# Patient Record
Sex: Female | Born: 1935 | Race: Black or African American | Hispanic: No | Smoking: Former smoker
Health system: Southern US, Community
[De-identification: ages and names within clinical notes are randomized; demographics above are authoritative.]

## PROBLEM LIST (undated history)

## (undated) DIAGNOSIS — I2510 Atherosclerotic heart disease of native coronary artery without angina pectoris: Secondary | ICD-10-CM

## (undated) DIAGNOSIS — E785 Hyperlipidemia, unspecified: Secondary | ICD-10-CM

## (undated) DIAGNOSIS — I10 Essential (primary) hypertension: Secondary | ICD-10-CM

## (undated) DIAGNOSIS — F0390 Unspecified dementia without behavioral disturbance: Secondary | ICD-10-CM

## (undated) DIAGNOSIS — M24562 Contracture, left knee: Secondary | ICD-10-CM

## (undated) DIAGNOSIS — M24561 Contracture, right knee: Secondary | ICD-10-CM

## (undated) HISTORY — DX: Essential (primary) hypertension: I10

## (undated) HISTORY — DX: Hyperlipidemia, unspecified: E78.5

## (undated) HISTORY — DX: Unspecified dementia, unspecified severity, without behavioral disturbance, psychotic disturbance, mood disturbance, and anxiety: F03.90

## (undated) HISTORY — PX: ABDOMINAL HYSTERECTOMY: SHX81

## (undated) HISTORY — PX: HIP SURGERY: SHX245

---

## 2009-03-01 ENCOUNTER — Emergency Department: Payer: Self-pay | Admitting: Emergency Medicine

## 2009-03-27 ENCOUNTER — Ambulatory Visit: Payer: Self-pay | Admitting: Internal Medicine

## 2009-06-24 ENCOUNTER — Emergency Department: Payer: Self-pay

## 2009-11-01 ENCOUNTER — Ambulatory Visit: Payer: Self-pay | Admitting: Internal Medicine

## 2010-02-07 ENCOUNTER — Encounter: Payer: Self-pay | Admitting: Internal Medicine

## 2010-02-12 ENCOUNTER — Encounter: Payer: Self-pay | Admitting: Internal Medicine

## 2010-03-15 ENCOUNTER — Encounter: Payer: Self-pay | Admitting: Internal Medicine

## 2013-09-05 ENCOUNTER — Emergency Department: Payer: Self-pay | Admitting: Internal Medicine

## 2013-09-05 LAB — URIC ACID: Uric Acid: 4.4 mg/dL (ref 2.6–6.0)

## 2013-10-13 ENCOUNTER — Encounter: Payer: Self-pay | Admitting: General Surgery

## 2013-10-13 ENCOUNTER — Ambulatory Visit (INDEPENDENT_AMBULATORY_CARE_PROVIDER_SITE_OTHER): Payer: Medicare PPO | Admitting: General Surgery

## 2013-10-13 VITALS — BP 100/78 | HR 76 | Resp 14 | Ht 61.0 in | Wt 89.0 lb

## 2013-10-13 DIAGNOSIS — D1779 Benign lipomatous neoplasm of other sites: Secondary | ICD-10-CM

## 2013-10-13 DIAGNOSIS — D1720 Benign lipomatous neoplasm of skin and subcutaneous tissue of unspecified limb: Secondary | ICD-10-CM

## 2013-10-13 NOTE — Progress Notes (Signed)
Patient ID: Tonya Snyder, female   DOB: 1920/10/24, 80 y.o.   MRN: [id number]  Chief Complaint  Patient presents with  . Mass    right arm    HPI Tonya Snyder is a 80 y.o. female who presents for an evaluation of a right arm mass. The patient noticed the mass approximately 10-15 years. The patient states the area has gotten larger. She denies any pain or discomfort in this area. Today her granddaughter, Tonya Snyder, accompanies her .     HPI  Past Medical History  Diagnosis Date  . Hypertension   . Dementia   . Hyperlipidemia     Past Surgical History  Procedure Laterality Date  . Abdominal hysterectomy    . Hip surgery      No family history on file.  Social History History  Substance Use Topics  . Smoking status: Never Smoker   . Smokeless tobacco: Current User    Types: Snuff  . Alcohol Use: No    No Known Allergies  Current Outpatient Prescriptions  Medication Sig Dispense Refill  . amLODipine (NORVASC) 5 MG tablet Take 1 tablet by mouth daily.      Marland Kitchen aspirin 81 MG tablet Take 1 tablet by mouth daily.      . benazepril (LOTENSIN) 10 MG tablet Take 1 tablet by mouth daily.      . clotrimazole-betamethasone (LOTRISONE) cream Apply 1 application topically 2 (two) times daily.      Marland Kitchen DOCUSATE SODIUM PO Take 1 tablet by mouth daily.      . hydrochlorothiazide (HYDRODIURIL) 25 MG tablet Take 1 tablet by mouth daily.      . hydrocortisone cream 1 % Apply 1 application topically 4 (four) times daily.      . mirtazapine (REMERON) 15 MG tablet Take 1 tablet by mouth daily.      . Multiple Vitamins-Minerals (MULTIVITAMIN PO) Take 1 tablet by mouth daily.      . potassium chloride (K-DUR,KLOR-CON) 10 MEQ tablet Take 1 tablet by mouth daily.      . simvastatin (ZOCOR) 20 MG tablet Take 1 tablet by mouth daily.       No current facility-administered medications for this visit.    Review of Systems Review of Systems  Constitutional: Negative.   Respiratory: Negative.    Cardiovascular: Negative.     Blood pressure 100/78, pulse 76, resp. rate 14, height 5\' 1"  (1.549 m), weight 89 lb (40.37 kg).  Physical Exam Physical Exam  Constitutional: She is oriented to person, place, and time. She appears well-developed and well-nourished.  Musculoskeletal:       Arms: Neurological: She is alert and oriented to person, place, and time.  Skin: Skin is warm and dry.  right anterior distal forearm 3 x 4 cm soft lobulated subdermal mass.       Assessment    Lipoma of the right arm, asymptomatic.     Plan    Os becomes uncomfortable, changes significantly in size or begins to cause undue concern for the patient, observation alone is warranted.     PCP/Ref MD: Tonya Snyder 10/14/2013, 8:38 PM

## 2013-10-13 NOTE — Patient Instructions (Signed)
Patient to return as needed. The patient is aware to call back for any questions or concerns. 

## 2013-10-14 DIAGNOSIS — D1720 Benign lipomatous neoplasm of skin and subcutaneous tissue of unspecified limb: Secondary | ICD-10-CM | POA: Insufficient documentation

## 2013-12-14 ENCOUNTER — Encounter: Payer: Self-pay | Admitting: General Surgery

## 2014-05-10 ENCOUNTER — Emergency Department: Payer: Self-pay | Admitting: Emergency Medicine

## 2014-05-10 DIAGNOSIS — S7002XA Contusion of left hip, initial encounter: Secondary | ICD-10-CM | POA: Diagnosis not present

## 2014-05-10 DIAGNOSIS — R55 Syncope and collapse: Secondary | ICD-10-CM | POA: Diagnosis not present

## 2014-05-10 LAB — CBC WITH DIFFERENTIAL/PLATELET
BASOS PCT: 0.3 %
Basophil #: 0 10*3/uL (ref 0.0–0.1)
EOS PCT: 0.6 %
Eosinophil #: 0 10*3/uL (ref 0.0–0.7)
HCT: 37.1 % (ref 35.0–47.0)
HGB: 12.6 g/dL (ref 12.0–16.0)
LYMPHS PCT: 11 %
Lymphocyte #: 0.4 10*3/uL — ABNORMAL LOW (ref 1.0–3.6)
MCH: 30.7 pg (ref 26.0–34.0)
MCHC: 34 g/dL (ref 32.0–36.0)
MCV: 91 fL (ref 80–100)
MONOS PCT: 9.2 %
Monocyte #: 0.4 x10 3/mm (ref 0.2–0.9)
NEUTROS PCT: 78.9 %
Neutrophil #: 3 10*3/uL (ref 1.4–6.5)
Platelet: 192 10*3/uL (ref 150–440)
RBC: 4.1 10*6/uL (ref 3.80–5.20)
RDW: 14.7 % — ABNORMAL HIGH (ref 11.5–14.5)
WBC: 3.8 10*3/uL (ref 3.6–11.0)

## 2014-05-10 LAB — COMPREHENSIVE METABOLIC PANEL
ALBUMIN: 3.5 g/dL
ALT: 14 U/L
ANION GAP: 9 (ref 7–16)
AST: 21 U/L
Alkaline Phosphatase: 61 U/L
BUN: 18 mg/dL
Bilirubin,Total: 0.4 mg/dL
CHLORIDE: 101 mmol/L
Calcium, Total: 8.9 mg/dL
Co2: 29 mmol/L
Creatinine: 0.7 mg/dL
EGFR (Non-African Amer.): >60
Glucose: 78 mg/dL
Potassium: 3.3 mmol/L — ABNORMAL LOW
SODIUM: 139 mmol/L
TOTAL PROTEIN: 6.8 g/dL

## 2014-06-10 DIAGNOSIS — I739 Peripheral vascular disease, unspecified: Secondary | ICD-10-CM | POA: Diagnosis not present

## 2014-06-17 DIAGNOSIS — G309 Alzheimer's disease, unspecified: Secondary | ICD-10-CM | POA: Diagnosis not present

## 2014-06-17 DIAGNOSIS — D649 Anemia, unspecified: Secondary | ICD-10-CM | POA: Diagnosis not present

## 2014-06-17 DIAGNOSIS — I10 Essential (primary) hypertension: Secondary | ICD-10-CM | POA: Diagnosis not present

## 2014-06-17 DIAGNOSIS — Z9181 History of falling: Secondary | ICD-10-CM | POA: Diagnosis not present

## 2014-06-17 DIAGNOSIS — F0280 Dementia in other diseases classified elsewhere without behavioral disturbance: Secondary | ICD-10-CM | POA: Diagnosis not present

## 2014-06-24 DIAGNOSIS — D649 Anemia, unspecified: Secondary | ICD-10-CM | POA: Diagnosis not present

## 2014-06-24 DIAGNOSIS — I10 Essential (primary) hypertension: Secondary | ICD-10-CM | POA: Diagnosis not present

## 2014-06-24 DIAGNOSIS — G309 Alzheimer's disease, unspecified: Secondary | ICD-10-CM | POA: Diagnosis not present

## 2014-06-24 DIAGNOSIS — Z9181 History of falling: Secondary | ICD-10-CM | POA: Diagnosis not present

## 2014-06-24 DIAGNOSIS — F0280 Dementia in other diseases classified elsewhere without behavioral disturbance: Secondary | ICD-10-CM | POA: Diagnosis not present

## 2014-07-07 ENCOUNTER — Encounter: Payer: Self-pay | Admitting: Emergency Medicine

## 2014-07-07 ENCOUNTER — Emergency Department: Payer: Medicare PPO

## 2014-07-07 ENCOUNTER — Other Ambulatory Visit: Payer: Self-pay

## 2014-07-07 ENCOUNTER — Observation Stay
Admission: EM | Admit: 2014-07-07 | Discharge: 2014-07-14 | Disposition: A | Discharge status: Skilled Nursing Facility | Payer: Medicare PPO | Attending: Internal Medicine | Admitting: Internal Medicine

## 2014-07-07 DIAGNOSIS — M25551 Pain in right hip: Secondary | ICD-10-CM | POA: Diagnosis not present

## 2014-07-07 DIAGNOSIS — R630 Anorexia: Secondary | ICD-10-CM | POA: Insufficient documentation

## 2014-07-07 DIAGNOSIS — R531 Weakness: Secondary | ICD-10-CM | POA: Diagnosis not present

## 2014-07-07 DIAGNOSIS — R195 Other fecal abnormalities: Secondary | ICD-10-CM | POA: Insufficient documentation

## 2014-07-07 DIAGNOSIS — Z66 Do not resuscitate: Secondary | ICD-10-CM | POA: Diagnosis not present

## 2014-07-07 DIAGNOSIS — Z87891 Personal history of nicotine dependence: Secondary | ICD-10-CM | POA: Insufficient documentation

## 2014-07-07 DIAGNOSIS — Z79899 Other long term (current) drug therapy: Secondary | ICD-10-CM | POA: Insufficient documentation

## 2014-07-07 DIAGNOSIS — K5900 Constipation, unspecified: Secondary | ICD-10-CM | POA: Insufficient documentation

## 2014-07-07 DIAGNOSIS — E43 Unspecified severe protein-calorie malnutrition: Secondary | ICD-10-CM | POA: Insufficient documentation

## 2014-07-07 DIAGNOSIS — E785 Hyperlipidemia, unspecified: Secondary | ICD-10-CM | POA: Diagnosis not present

## 2014-07-07 DIAGNOSIS — I10 Essential (primary) hypertension: Secondary | ICD-10-CM | POA: Diagnosis not present

## 2014-07-07 DIAGNOSIS — Z96641 Presence of right artificial hip joint: Secondary | ICD-10-CM | POA: Diagnosis not present

## 2014-07-07 DIAGNOSIS — Z7982 Long term (current) use of aspirin: Secondary | ICD-10-CM | POA: Insufficient documentation

## 2014-07-07 DIAGNOSIS — J189 Pneumonia, unspecified organism: Principal | ICD-10-CM | POA: Diagnosis present

## 2014-07-07 DIAGNOSIS — Z681 Body mass index (BMI) 19 or less, adult: Secondary | ICD-10-CM | POA: Diagnosis not present

## 2014-07-07 DIAGNOSIS — E876 Hypokalemia: Secondary | ICD-10-CM | POA: Diagnosis present

## 2014-07-07 DIAGNOSIS — R778 Other specified abnormalities of plasma proteins: Secondary | ICD-10-CM

## 2014-07-07 DIAGNOSIS — F0390 Unspecified dementia without behavioral disturbance: Secondary | ICD-10-CM | POA: Insufficient documentation

## 2014-07-07 DIAGNOSIS — R296 Repeated falls: Secondary | ICD-10-CM | POA: Diagnosis not present

## 2014-07-07 DIAGNOSIS — R7989 Other specified abnormal findings of blood chemistry: Secondary | ICD-10-CM

## 2014-07-07 DIAGNOSIS — W19XXXA Unspecified fall, initial encounter: Secondary | ICD-10-CM | POA: Diagnosis present

## 2014-07-07 LAB — CBC WITH DIFFERENTIAL/PLATELET
BASOS PCT: 0 %
Basophils Absolute: 0 10*3/uL (ref 0–0.1)
Eosinophils Absolute: 0 10*3/uL (ref 0–0.7)
Eosinophils Relative: 0 %
HEMATOCRIT: 36.8 % (ref 35.0–47.0)
Hemoglobin: 12.6 g/dL (ref 12.0–16.0)
LYMPHS PCT: 4 %
Lymphs Abs: 0.3 10*3/uL — ABNORMAL LOW (ref 1.0–3.6)
MCH: 30.4 pg (ref 26.0–34.0)
MCHC: 34.4 g/dL (ref 32.0–36.0)
MCV: 88.4 fL (ref 80.0–100.0)
MONOS PCT: 7 %
Monocytes Absolute: 0.5 10*3/uL (ref 0.2–0.9)
NEUTROS ABS: 6.6 10*3/uL — AB (ref 1.4–6.5)
NEUTROS PCT: 89 %
Platelets: 169 10*3/uL (ref 150–440)
RBC: 4.16 MIL/uL (ref 3.80–5.20)
RDW: 13.8 % (ref 11.5–14.5)
WBC: 7.4 10*3/uL (ref 3.6–11.0)

## 2014-07-07 LAB — COMPREHENSIVE METABOLIC PANEL
ALBUMIN: 3.6 g/dL (ref 3.5–5.0)
ALK PHOS: 77 U/L (ref 38–126)
ALT: 19 U/L (ref 14–54)
AST: 35 U/L (ref 15–41)
Anion gap: 9 (ref 5–15)
BUN: 8 mg/dL (ref 6–20)
CALCIUM: 8.8 mg/dL — AB (ref 8.9–10.3)
CO2: 25 mmol/L (ref 22–32)
Chloride: 100 mmol/L — ABNORMAL LOW (ref 101–111)
Creatinine, Ser: 0.54 mg/dL (ref 0.44–1.00)
GFR calc Af Amer: >60 mL/min (ref >60)
GFR calc non Af Amer: >60 mL/min (ref >60)
GLUCOSE: 103 mg/dL — AB (ref 65–99)
POTASSIUM: 3.1 mmol/L — AB (ref 3.5–5.1)
Sodium: 134 mmol/L — ABNORMAL LOW (ref 135–145)
TOTAL PROTEIN: 6.9 g/dL (ref 6.5–8.1)
Total Bilirubin: 0.6 mg/dL (ref 0.3–1.2)

## 2014-07-07 LAB — TROPONIN I
TROPONIN I: 0.04 ng/mL — AB (ref <0.031)
Troponin I: 0.05 ng/mL — ABNORMAL HIGH (ref <0.031)

## 2014-07-07 LAB — URINALYSIS COMPLETE WITH MICROSCOPIC (ARMC ONLY)
Bacteria, UA: NONE SEEN
Bilirubin Urine: NEGATIVE
Glucose, UA: NEGATIVE mg/dL
Leukocytes, UA: NEGATIVE
Nitrite: NEGATIVE
Protein, ur: NEGATIVE mg/dL
SQUAMOUS EPITHELIAL / LPF: NONE SEEN
Specific Gravity, Urine: 1.004 — ABNORMAL LOW (ref 1.005–1.030)
pH: 7 (ref 5.0–8.0)

## 2014-07-07 LAB — PROTIME-INR
INR: 1.08
PROTHROMBIN TIME: 14.2 s (ref 11.4–15.0)

## 2014-07-07 LAB — MAGNESIUM: Magnesium: 1.6 mg/dL — ABNORMAL LOW (ref 1.7–2.4)

## 2014-07-07 MED ORDER — AMLODIPINE BESYLATE 5 MG PO TABS
5.0000 mg | ORAL_TABLET | Freq: Every day | ORAL | Status: DC
  Administered 2014-07-07 – 2014-07-14 (×8): 5 mg via ORAL
  Filled 2014-07-07 (×8): qty 1

## 2014-07-07 MED ORDER — LEVOFLOXACIN 750 MG PO TABS
ORAL_TABLET | ORAL | Status: AC
  Filled 2014-07-07: qty 1

## 2014-07-07 MED ORDER — POTASSIUM CHLORIDE CRYS ER 10 MEQ PO TBCR
10.0000 meq | EXTENDED_RELEASE_TABLET | Freq: Every day | ORAL | Status: DC
  Administered 2014-07-07 – 2014-07-14 (×8): 10 meq via ORAL
  Filled 2014-07-07 (×8): qty 1

## 2014-07-07 MED ORDER — DOCUSATE SODIUM 100 MG PO CAPS
100.0000 mg | ORAL_CAPSULE | Freq: Every day | ORAL | Status: DC
  Administered 2014-07-07 – 2014-07-13 (×7): 100 mg via ORAL
  Filled 2014-07-07 (×7): qty 1

## 2014-07-07 MED ORDER — CEFTRIAXONE SODIUM IN DEXTROSE 20 MG/ML IV SOLN
1.0000 g | INTRAVENOUS | Status: DC
  Administered 2014-07-07 – 2014-07-09 (×3): 1 g via INTRAVENOUS
  Filled 2014-07-07 (×3): qty 50

## 2014-07-07 MED ORDER — AZITHROMYCIN 250 MG PO TABS
ORAL_TABLET | ORAL | Status: AC
  Administered 2014-07-07: 500 mg via ORAL
  Filled 2014-07-07: qty 2

## 2014-07-07 MED ORDER — SIMVASTATIN 20 MG PO TABS
20.0000 mg | ORAL_TABLET | Freq: Every day | ORAL | Status: DC
  Administered 2014-07-07 – 2014-07-13 (×7): 20 mg via ORAL
  Filled 2014-07-07 (×7): qty 1

## 2014-07-07 MED ORDER — AZITHROMYCIN 250 MG PO TABS
500.0000 mg | ORAL_TABLET | ORAL | Status: DC
  Administered 2014-07-07 – 2014-07-09 (×3): 500 mg via ORAL
  Filled 2014-07-07 (×3): qty 2

## 2014-07-07 MED ORDER — ENOXAPARIN SODIUM 100 MG/ML ~~LOC~~ SOLN
SUBCUTANEOUS | Status: AC
  Filled 2014-07-07: qty 1

## 2014-07-07 MED ORDER — LEVOFLOXACIN 750 MG PO TABS
750.0000 mg | ORAL_TABLET | Freq: Once | ORAL | Status: AC
  Administered 2014-07-07: 750 mg via ORAL

## 2014-07-07 MED ORDER — ASPIRIN EC 81 MG PO TBEC
81.0000 mg | DELAYED_RELEASE_TABLET | Freq: Every day | ORAL | Status: DC
  Administered 2014-07-07 – 2014-07-14 (×8): 81 mg via ORAL
  Filled 2014-07-07 (×8): qty 1

## 2014-07-07 MED ORDER — ENOXAPARIN SODIUM 40 MG/0.4ML ~~LOC~~ SOLN
40.0000 mg | SUBCUTANEOUS | Status: DC
  Administered 2014-07-07: 21:00:00 40 mg via SUBCUTANEOUS
  Filled 2014-07-07: qty 0.4

## 2014-07-07 MED ORDER — HYDRALAZINE HCL 20 MG/ML IJ SOLN
10.0000 mg | Freq: Four times a day (QID) | INTRAMUSCULAR | Status: DC | PRN
  Administered 2014-07-09: 03:00:00 10 mg via INTRAVENOUS
  Filled 2014-07-07: qty 1

## 2014-07-07 MED ORDER — POTASSIUM CHLORIDE CRYS ER 20 MEQ PO TBCR
EXTENDED_RELEASE_TABLET | ORAL | Status: AC
  Administered 2014-07-07: 40 meq via ORAL
  Filled 2014-07-07: qty 2

## 2014-07-07 MED ORDER — LEVOFLOXACIN 750 MG PO TABS: 750.0000 mg | ORAL_TABLET | Freq: Every day | ORAL | Status: DC

## 2014-07-07 MED ORDER — BENAZEPRIL HCL 10 MG PO TABS
10.0000 mg | ORAL_TABLET | Freq: Every day | ORAL | Status: DC
  Administered 2014-07-07 – 2014-07-14 (×8): 10 mg via ORAL
  Filled 2014-07-07 (×8): qty 1

## 2014-07-07 MED ORDER — MIRTAZAPINE 7.5 MG PO TABS
7.5000 mg | ORAL_TABLET | Freq: Every day | ORAL | Status: DC
  Administered 2014-07-07: 21:00:00 7.5 mg via ORAL
  Administered 2014-07-08: 21:00:00 via ORAL
  Administered 2014-07-09 – 2014-07-13 (×5): 7.5 mg via ORAL
  Filled 2014-07-07 (×10): qty 1

## 2014-07-07 MED ORDER — POTASSIUM CHLORIDE CRYS ER 20 MEQ PO TBCR
40.0000 meq | EXTENDED_RELEASE_TABLET | Freq: Once | ORAL | Status: AC
  Administered 2014-07-07: 40 meq via ORAL

## 2014-07-07 MED ORDER — CEFTRIAXONE SODIUM IN DEXTROSE 20 MG/ML IV SOLN
INTRAVENOUS | Status: AC
  Administered 2014-07-07: 1 g via INTRAVENOUS
  Filled 2014-07-07: qty 50

## 2014-07-07 NOTE — ED Notes (Signed)
RN assisted pt ambulation with walker. Pt weak and unbalanced at times. MD aware. Family at bedside. Will continue to monitor.

## 2014-07-07 NOTE — ED Provider Notes (Addendum)
[facility] Emergency Department Provider Note     Time seen: ----------------------------------------- 7:15 AM on 07/07/2014 -----------------------------------------    I have reviewed the triage vital signs and the nursing notes. Level V caveat, review of systems and history unable to be obtained due to history of dementia  HISTORY  Chief Complaint No chief complaint on file.    HPI Tonya Snyder is a 81 y.o. female who is brought to ER for a fall.Patient reportedly tripped and fell today, has no complaints does not describe any pain at all. Family also reports that they found some blood in her diaper, they're unsure where it came from. She's not had a history of bleeding, has had multiple recent falls, she refuses to use her walker, will use a cane at times but not the walker.    Past Medical History  Diagnosis Date  . Hypertension   . Dementia   . Hyperlipidemia     Patient Active Problem List   Diagnosis Date Noted  . Lipoma of arm 10/14/2013    Past Surgical History  Procedure Laterality Date  . Abdominal hysterectomy    . Hip surgery      Current Outpatient Rx  Name  Route  Sig  Dispense  Refill  . amLODipine (NORVASC) 5 MG tablet   Oral   Take 1 tablet by mouth daily.         Marland Kitchen aspirin 81 MG tablet   Oral   Take 1 tablet by mouth daily.         . benazepril (LOTENSIN) 10 MG tablet   Oral   Take 1 tablet by mouth daily.         . clotrimazole-betamethasone (LOTRISONE) cream   Topical   Apply 1 application topically 2 (two) times daily.         Marland Kitchen DOCUSATE SODIUM PO   Oral   Take 1 tablet by mouth daily.         . hydrochlorothiazide (HYDRODIURIL) 25 MG tablet   Oral   Take 1 tablet by mouth daily.         . hydrocortisone cream 1 %   Topical   Apply 1 application topically 4 (four) times daily.         . mirtazapine (REMERON) 15 MG tablet   Oral   Take 1 tablet by mouth daily.         .  Multiple Vitamins-Minerals (MULTIVITAMIN PO)   Oral   Take 1 tablet by mouth daily.         . potassium chloride (K-DUR,KLOR-CON) 10 MEQ tablet   Oral   Take 1 tablet by mouth daily.         . simvastatin (ZOCOR) 20 MG tablet   Oral   Take 1 tablet by mouth daily.           Allergies Review of patient's allergies indicates no known allergies.  No family history on file.  Social History History  Substance Use Topics  . Smoking status: Never Smoker   . Smokeless tobacco: Current User    Types: Snuff  . Alcohol Use: No    Review of Systems Patient denies complaints, review of systems difficult due to dementia Positive for bleeding, unsure source 10-point ROS otherwise negative.  ____________________________________________   PHYSICAL EXAM:  VITAL SIGNS: ED Triage Vitals  Enc Vitals Group     BP --      Pulse --  Resp --      Temp --      Temp src --      SpO2 --      Weight --      Height --      Head Cir --      Peak Flow --      Pain Score --      Pain Loc --      Pain Edu? --      Excl. in [place]? --     Constitutional: Alert and oriented. Well appearing and in no distress. Eyes: Conjunctivae are normal. PERRL. Normal extraocular movements. ENT   Head: Normocephalic and atraumatic.   Nose: No congestion/rhinnorhea.   Mouth/Throat: Mucous membranes are moist.   Neck: No stridor. Hematological/Lymphatic/Immunilogical: No cervical lymphadenopathy. Cardiovascular: Normal rate, regular rhythm. Normal and symmetric distal pulses are present in all extremities. No murmurs, rubs, or gallops. Respiratory: Normal respiratory effort without tachypnea nor retractions. Breath sounds are clear and equal bilaterally. No wheezes/rales/rhonchi. Gastrointestinal: Soft and nontender. No distention. No abdominal bruits. There is no CVA tenderness. Genitourinary: No obvious vaginal bleeding no bleeding around the vagina Rectal: No obvious hemorrhoids,  she does have heme positive stool. No gross blood Musculoskeletal: Nontender with normal range of motion in all extremities. No joint effusions.  No lower extremity tenderness nor edema. Neurologic:  Normal speech and language. No gross focal neurologic deficits are appreciated. Speech is normal. No gait instability. Skin:  Skin is warm, dry and intact. No rash noted.  ____________________________________________  EKG: Interpreted by me, rate is 90, LVH, no evidence of acute infarction, nonspecific T wave changes, normal axis.  LABS (pertinent positives/negatives)  Labs Reviewed  CBC WITH DIFFERENTIAL/PLATELET - Abnormal; Notable for the following:    Neutro Abs 6.6 (*)    Lymphs Abs 0.3 (*)    All other components within normal limits  COMPREHENSIVE METABOLIC PANEL - Abnormal; Notable for the following:    Sodium 134 (*)    Potassium 3.1 (*)    Chloride 100 (*)    Glucose, Bld 103 (*)    Calcium 8.8 (*)    All other components within normal limits  TROPONIN I - Abnormal; Notable for the following:    Troponin I 0.05 (*)    All other components within normal limits  TROPONIN I - Abnormal; Notable for the following:    Troponin I 0.04 (*)    All other components within normal limits  PROTIME-INR    ____________________________________________  ED COURSE:  Pertinent labs & imaging results that were available during my care of the patient were reviewed by me and considered in my medical decision making (see chart for details).  I will attempt to ambulate the patient, at this point she does not appear to have any injuries. We'll assess for GI bleed. ____________________________________________   RADIOLOGY  None indicated  ____________________________________________    FINAL ASSESSMENT AND PLAN  Fall without complaint, dementia, heme positive stool, possible pneumonia  Plan: Patient does not appear to be any acute distress, does not have any visible or obvious  injuries. However she does have heme positive stools. There was report of blood being seen rectally, which I did it not seeing now. Her troponin was trace elevated 0.05, recheck is 0.04. EKG does not indicate ST elevation. Patient with possible left lower lobe atelectasis versus pneumonia. She does not have any signs or symptoms of this, we'll double cover with Levaquin. Family is unhappy with her going  home, but I advise she does not have any specific criteria for admission this time, was arranged home health physical therapy. She is stable for outpatient follow-up, will have her follow-up with her doctor first for reevaluation.  Family is uncomfortable taking the patient home despite multiple efforts to reassure them. [facility] consult hospitalist for observation.  Earleen Newport, MD     Earleen Newport, MD 07/07/14 1254  Earleen Newport, MD 07/07/14 [phone]

## 2014-07-07 NOTE — Plan of Care (Signed)
Problem: Discharge Progression Outcomes Goal: Other Discharge Outcomes/Goals Outcome: Progressing Goals and outcome progression: -Pt admitted with LLL pneumonia with a major decline the past couple day's per family - High fall risk, more then 2 falls in 6 months - Pt is observation, possible discharge tomorrow after physical therapy sees physical therapy and evalautes level of needed care. - code status was discussed with pt and she wants to remain a full code

## 2014-07-07 NOTE — Plan of Care (Signed)
Problem: Discharge Progression Outcomes Goal: Discharge plan in place and appropriate Outcome: Progressing Pt is observation

## 2014-07-07 NOTE — ED Notes (Signed)
Per Dr. Bridgett Larsson will put orders in for blood pressure.

## 2014-07-07 NOTE — Plan of Care (Signed)
Problem: Discharge Progression Outcomes Goal: Pain controlled with appropriate interventions Outcome: Progressing No pain noted Goal: Tolerating diet Outcome: Progressing Pt has lost more then 15 pounds in 3 months, she is 88lbs. Family says she has a poor appetite and dietary consult maybe needed for interventions.

## 2014-07-07 NOTE — Discharge Instructions (Signed)
Fall Prevention and Home Safety Falls cause injuries and can affect all age groups. It is possible to prevent falls.  HOW TO PREVENT FALLS  Wear shoes with rubber soles that do not have an opening for your toes.  Keep the inside and outside of your house well lit.  Use night lights throughout your home.  Remove clutter from floors.  Clean up floor spills.  Remove throw rugs or fasten them to the floor with carpet tape.  Do not place electrical cords across pathways.  Put grab bars by your tub, shower, and toilet. Do not use towel bars as grab bars.  Put handrails on both sides of the stairway. Fix loose handrails.  Do not climb on stools or stepladders, if possible.  Do not wax your floors.  Repair uneven or unsafe sidewalks, walkways, or stairs.  Keep items you use a lot within reach.  Be aware of pets.  Keep emergency numbers next to the telephone.  Put smoke detectors in your home and near bedrooms. Ask your doctor what other things you can do to prevent falls. Document Released: 11/25/2008 Document Revised: 07/31/2011 Document Reviewed: 05/01/2011 [facility] Patient Information 2015 [place], [place]. This information is not intended to replace advice given to you by your health care provider. Make sure you discuss any questions you have with your health care provider. Rectal Bleeding Rectal bleeding is when blood passes out of the anus. It is usually a sign that something is wrong. It may not be serious, but it should always be evaluated. Rectal bleeding may present as bright red blood or extremely dark stools. The color may range from dark red or maroon to black (like tar). It is important that the cause of rectal bleeding be identified so treatment can be started and the problem corrected. CAUSES   Hemorrhoids. These are enlarged (dilated) blood vessels or veins in the anal or rectal area.  Fistulas. Theseare abnormal, burrowing channels that usually run from inside  the rectum to the skin around the anus. They can bleed.  Anal fissures. This is a tear in the tissue of the anus. Bleeding occurs with bowel movements.  Diverticulosis. This is a condition in which pockets or sacs project from the bowel wall. Occasionally, the sacs can bleed.  Diverticulitis. Thisis an infection involving diverticulosis of the colon.  Proctitis and colitis. These are conditions in which the rectum, colon, or both, can become inflamed and pitted (ulcerated).  Polyps and cancer. Polyps are non-cancerous (benign) growths in the colon that may bleed. Certain types of polyps turn into cancer.  Protrusion of the rectum. Part of the rectum can project from the anus and bleed.  Certain medicines.  Intestinal infections.  Blood vessel abnormalities. HOME CARE INSTRUCTIONS  Eat a high-fiber diet to keep your stool soft.  Limit activity.  Drink enough fluids to keep your urine clear or pale yellow.  Warm baths may be useful to soothe rectal pain.  Follow up with your caregiver as directed. SEEK IMMEDIATE MEDICAL CARE IF:  You develop increased bleeding.  You have black or dark red stools.  You vomit blood or material that looks like coffee grounds.  You have abdominal pain or tenderness.  You have a fever.  You feel weak, nauseous, or you faint.  You have severe rectal pain or you are unable to have a bowel movement. MAKE SURE YOU:  Understand these instructions.  Will watch your condition.  Will get help right away if you are not doing  well or get worse. Document Released: 07/21/2001 Document Revised: 04/23/2011 Document Reviewed: 07/16/2010 [facility] Patient Information 2015 [place], [place]. This information is not intended to replace advice given to you by your health care provider. Make sure you discuss any questions you have with your health care provider.

## 2014-07-07 NOTE — H&P (Signed)
[facility] [place] NAME: Tonya Snyder    MR#:  [id number]  DATE OF BIRTH:  05-10-1920  DATE OF ADMISSION:  07/07/2014  PRIMARY CARE PHYSICIAN: Lavera Guise, MD   REQUESTING/REFERRING PHYSICIAN: Dr. Jimmye Norman.  CHIEF COMPLAINT:   Chief Complaint  Patient presents with  . Fall    HISTORY OF PRESENT ILLNESS:  Tonya Snyder  is a 81 y.o. female with a known history of hypertension, hyperlipidemia and dementia. The patient was brought to the ED due to fall today. According to patient's son and grandson, the patient the can walk herself on her baseline. However, the patient has had a poor oral intake, generalized weakness and multiple falls for the past a few days. She was noticed to be confused and fell today. The patient is a demented, unable to provide any information. She denies any symptoms. According to patient's son and grandson, patient has no cough shortness breath or sputum, no chest pain or leg edema, no abdominal pain, nausea, vomiting or diarrhea, no black stool bloody stool. Chest x-ray show left base pneumonia. Troponin is 0.05 then recheck is 0.04. Dr. Jimmye Norman, ED physician, planned to discharge the patient with Levaquin, but the family member's and had PE with her going home, so patient will be placed for observation. Past Medical History  Diagnosis Date  . Hypertension   . Dementia   . Hyperlipidemia     PAST SURGICAL HISTORY:   Past Surgical History  Procedure Laterality Date  . Abdominal hysterectomy    . Hip surgery      SOCIAL HISTORY:   History  Substance Use Topics  . Smoking status: Never Smoker   . Smokeless tobacco: Current User    Types: Snuff  . Alcohol Use: No    FAMILY HISTORY:  History reviewed. No pertinent family history. hypertension, diabetes, heart attack.  DRUG ALLERGIES:  No Known Allergies  REVIEW OF SYSTEMS:  The patient is a demented, she she denies any symptoms. System is not  unreliable.  MEDICATIONS AT HOME:   Prior to Admission medications   Medication Sig Start Date End Date Taking? Authorizing Provider  amLODipine (NORVASC) 5 MG tablet Take 1 tablet by mouth daily. 10/05/13  Yes Historical Provider, MD  aspirin EC 81 MG tablet Take 81 mg by mouth daily.   Yes Historical Provider, MD  benazepril (LOTENSIN) 10 MG tablet Take 1 tablet by mouth daily. 10/05/13  Yes Historical Provider, MD  docusate sodium (COLACE) 100 MG capsule Take 100 mg by mouth at bedtime.   Yes Historical Provider, MD  mirtazapine (REMERON) 15 MG tablet Take 0.5 tablets by mouth at bedtime.    Yes Historical Provider, MD  Multiple Vitamins-Minerals (MULTIVITAMIN PO) Take 1 tablet by mouth daily. 07/27/13  Yes Historical Provider, MD  potassium chloride (K-DUR,KLOR-CON) 10 MEQ tablet Take 1 tablet by mouth daily. 10/05/13  Yes Historical Provider, MD  simvastatin (ZOCOR) 20 MG tablet Take 1 tablet by mouth at bedtime.    Yes Historical Provider, MD  hydrochlorothiazide (HYDRODIURIL) 25 MG tablet Take 1 tablet by mouth daily. 10/05/13   Historical Provider, MD  hydrocortisone cream 1 % Apply 1 application topically 4 (four) times daily.    Historical Provider, MD  levofloxacin (LEVAQUIN) 750 MG tablet Take 1 tablet (750 mg total) by mouth daily. 07/07/14   Earleen Newport, MD      VITAL SIGNS:  Blood pressure 170/86, pulse 95, temperature 98 F (36.7 C),  temperature source Oral, height 5\' 5"  (1.651 m), weight 43.999 kg (97 lb), SpO2 98 %.  PHYSICAL EXAMINATION:  GENERAL:  81 y.o.-year-old patient lying in the bed with no acute distress.  EYES: Pupils equal, round, reactive to light and accommodation. No scleral icterus. Extraocular muscles intact.  HEENT: Head atraumatic, normocephalic. Oropharynx and nasopharynx clear. Dry oral mucosa. NECK:  Supple, no jugular venous distention. No thyroid enlargement, no tenderness.  LUNGS: Normal breath sounds bilaterally, no wheezing, rales,rhonchi or  crepitation. No use of accessory muscles of respiration.  CARDIOVASCULAR: S1, S2 normal. No murmurs, rubs, or gallops.  ABDOMEN: Soft, nontender, nondistended. Bowel sounds present. No organomegaly or mass.  EXTREMITIES: No pedal edema, cyanosis, or clubbing.  NEUROLOGIC: Cranial nerves II through XII are intact. Muscle strength 4/5 in all extremities. Sensation intact. Gait not checked.  PSYCHIATRIC: The patient is demented. SKIN: No obvious rash, lesion, or ulcer.   LABORATORY PANEL:   CBC  Recent Labs Lab 07/07/14 0759  WBC 7.4  HGB 12.6  HCT 36.8  PLT 169   ------------------------------------------------------------------------------------------------------------------  Chemistries   Recent Labs Lab 07/07/14 0759  NA 134*  K 3.1*  CL 100*  CO2 25  GLUCOSE 103*  BUN 8  CREATININE 0.54  CALCIUM 8.8*  AST 35  ALT 19  ALKPHOS 77  BILITOT 0.6   ------------------------------------------------------------------------------------------------------------------  Cardiac Enzymes  Recent Labs Lab 07/07/14 0945  TROPONINI 0.04*   ------------------------------------------------------------------------------------------------------------------  RADIOLOGY:  Dg Chest 1 View  07/07/2014   CLINICAL DATA:  Multiple recent falls, fall today  EXAM: CHEST  1 VIEW  COMPARISON:  06/24/2009  FINDINGS: Cardiomediastinal silhouette is stable. Mild hyperinflation again noted. There is patchy atelectasis or infiltrate in left base laterally. Follow-up to resolution is recommended. No pulmonary edema. Atherosclerotic calcifications of thoracic aorta.  IMPRESSION: Patchy atelectasis or infiltrate in left base laterally. Follow-up to resolution is recommended. No pulmonary edema.   Electronically Signed   By: Lahoma Crocker M.D.   On: 07/07/2014 12:25   Dg Pelvis 1-2 Views  07/07/2014   CLINICAL DATA:  Tripped and fell today.  EXAM: PELVIS - 1-2 VIEW  COMPARISON:  05/10/2014  FINDINGS: Exam  demonstrates mild diffuse decreased bone mineralization. There are mild degenerate changes of the left hip. Unipolar right hip arthroplasty intact and unchanged. There is no acute fracture or dislocation. There are moderate degenerative changes of the spine and sacroiliac joints. Mild degenerate change of the symphysis pubis joint.  IMPRESSION: No acute findings.  Right hip arthroplasty intact and unchanged. Mild degenerative change of the left hip.   Electronically Signed   By: Marin Olp M.D.   On: 07/07/2014 12:25    EKG:   Orders placed or performed during the hospital encounter of 07/07/14  . ED EKG  . ED EKG    Impression and plan of treatment :   The patient will be placed for observation. Left side pneumonia . I will start Zithromax and Rocephin, follow-up CBC, blood culture and sputum culture. Multiple fall and generalized weakness. I will request a PT consult . Hypokalemia. I will give Klor-Con by mouth and follow-up potassium level and magnesium level. Mild elevated troponin. The second troponin is back to normal range. Possibly due to demanding ischemia. I will continue aspirin and statin. Hypertension. Continue the patient's home hypertension medication including Norvasc and benazepril. GIB: The patient does have heme positive stools per Dr. Jimmye Norman note. I will follow-up CBC and a request a GI consult.  Hypokalemia ult  iple falls All the records are reviewed and case discussed with ED provider. Management plans discussed with the patient's son and grandson and they are in agreement.  CODE STATUS: Full code according to patient's son and grandson TOTAL TIME TAKING CARE OF THIS PATIENT: 52 minutes.    Demetrios Loll M.D on 07/07/2014 at 2:48 PM  Between 7am to 6pm - Pager - [phone]  After 6pm go to [email or web address] - password [email or web address]  Office  [phone]  CC: Primary care physician; Lavera Guise, MD

## 2014-07-07 NOTE — Plan of Care (Signed)
Problem: Discharge Progression Outcomes Goal: Barriers To Progression Addressed/Resolved Outcome: Progressing Pt has history of dementia and lives by herself. Pt may need a PT consult before discharge to evaluate higher level of care.

## 2014-07-07 NOTE — Plan of Care (Signed)
Problem: Discharge Progression Outcomes Goal: Vaccine documented on D/C instructions Outcome: Progressing Pt will have pneumonia vaccine at discharge per family they are unsure if she has had one

## 2014-07-07 NOTE — ED Notes (Signed)
Pt arrived via EMS from home. Per EMS patient was found on the floor; patient was trying to go to the bathroom. Pt is alert to person and place. Pt denies pain. No apparent bruising noted.

## 2014-07-07 NOTE — ED Notes (Signed)
Pt informed to return if life threatening symptoms occur.   

## 2014-07-07 NOTE — ED Notes (Signed)
Xray at bedside. Family present.

## 2014-07-07 NOTE — ED Notes (Signed)
Family very concerned about mental and physical changes with mother since this morning. Pt's family states she has a decline in appearance and health. Family concerned with discharge process and wanted something to be done.

## 2014-07-07 NOTE — Care Management Note (Signed)
Case Management Note  Patient Details  Name: Tonya Snyder MRN: [id number] Date of Birth: Apr 22, 1920  Subjective/Objective:     Asked to set up home PT for pt. By Dr Jimmye Norman. Marthann Schiller  [facility],   [facility] is the pt. PCP provider.             Action/Plan: [phone], and gave him the pts. Info. Also got Dr Jimmye Norman to fill out face to face and MD orders.  Pt and family given the contact numbers to contact [facility] if they  Don't hear from them by tomorrow am. Expected Discharge Date:                  Expected Discharge Plan:     In-House Referral:     Discharge planning Services     Post Acute Care Choice:    Choice offered to:     DME Arranged:    DME Agency:     HH Arranged:    [place] Agency:     Status of Service:     Medicare Important Message Given:    Date Medicare IM Given:    Medicare IM give by:    Date Additional Medicare IM Given:    Additional Medicare Important Message give by:     If discussed at [place] of Stay Meetings, dates discussed:    Additional Comments:  Beau Fanny, RN 07/07/2014, 1:16 PM

## 2014-07-08 ENCOUNTER — Encounter: Payer: Self-pay | Admitting: Urgent Care

## 2014-07-08 DIAGNOSIS — R195 Other fecal abnormalities: Secondary | ICD-10-CM | POA: Diagnosis not present

## 2014-07-08 DIAGNOSIS — R634 Abnormal weight loss: Secondary | ICD-10-CM | POA: Diagnosis not present

## 2014-07-08 LAB — BASIC METABOLIC PANEL
Anion gap: 7 (ref 5–15)
BUN: 11 mg/dL (ref 6–20)
CHLORIDE: 104 mmol/L (ref 101–111)
CO2: 24 mmol/L (ref 22–32)
Calcium: 8.2 mg/dL — ABNORMAL LOW (ref 8.9–10.3)
Creatinine, Ser: 0.77 mg/dL (ref 0.44–1.00)
GFR calc non Af Amer: >60 mL/min (ref >60)
Glucose, Bld: 81 mg/dL (ref 65–99)
Potassium: 3.5 mmol/L (ref 3.5–5.1)
SODIUM: 135 mmol/L (ref 135–145)

## 2014-07-08 LAB — CBC
HEMATOCRIT: 34.9 % — AB (ref 35.0–47.0)
Hemoglobin: 12.1 g/dL (ref 12.0–16.0)
MCH: 30.7 pg (ref 26.0–34.0)
MCHC: 34.6 g/dL (ref 32.0–36.0)
MCV: 89 fL (ref 80.0–100.0)
PLATELETS: 166 10*3/uL (ref 150–440)
RBC: 3.92 MIL/uL (ref 3.80–5.20)
RDW: 14.3 % (ref 11.5–14.5)
WBC: 4.3 10*3/uL (ref 3.6–11.0)

## 2014-07-08 MED ORDER — ENOXAPARIN SODIUM 30 MG/0.3ML ~~LOC~~ SOLN
30.0000 mg | SUBCUTANEOUS | Status: DC
  Administered 2014-07-08 – 2014-07-13 (×6): 30 mg via SUBCUTANEOUS
  Filled 2014-07-08 (×6): qty 0.3

## 2014-07-08 MED ORDER — ENSURE ENLIVE PO LIQD
237.0000 mL | Freq: Two times a day (BID) | ORAL | Status: DC
  Administered 2014-07-08 – 2014-07-14 (×11): 237 mL via ORAL

## 2014-07-08 NOTE — Plan of Care (Signed)
Problem: Discharge Progression Outcomes Goal: Discharge plan in place and appropriate Individualization of Care Pt prefers to be called Tonya Snyder  history of hypertension, hyperlipidemia and dementia on home medications, Lives by self High Fall Precautions Goal: Other Discharge Outcomes/Goals Plan of Care Progress to Goal:  Pt o2 sats are wnl, on antibiotics per orders, pt a/o x 4 at this time. Bed alarm in use for pts safety

## 2014-07-08 NOTE — Evaluation (Signed)
Physical Therapy Evaluation Patient Details Name: Tonya Snyder MRN: [id number] DOB: 05/08/1920 Today's Date: 07/08/2014   History of Present Illness  presented to ER status post fall in home environment, decreased PO intake and progressive weakness; admitted with L lower lobe PNA.  Also noted with heme positive stools; per GI notes, family declines invasive intervention at this time.  HgB stable; no further work up indicated.  Clinical Impression  Upon evaluation, patient alert and oriented to self only.  Generally pleasant and cooperative throughout session.  Strength and ROM grossly WFL and symmetrical, but generally weak and deconditioned.  Very unsteady and unstable with mobility; constantly reaching for external stabilization with all OOB attempts.  Minimal/no functional reachin in standing.  Currently requiring min/mod assist from therapist for all mobility; improved to min assist with use of RW, but still with multiple LOB (esp with turns, obstacle negotiation and surface changes).  Very high risk for recurrent falls; unsafe to return home alone at this time. Would benefit from skilled PT to address above deficits and promote optimal return to PLOF;recommend transition to STR upon discharge from acute hospitalization.     Follow Up Recommendations SNF    Equipment Recommendations       Recommendations for Other Services       Precautions / Restrictions Precautions Precautions: Fall Restrictions Weight Bearing Restrictions: No      Mobility  Bed Mobility Overal bed mobility: Needs Assistance Bed Mobility: Supine to Sit     Supine to sit: Min assist;Mod assist        Transfers Overall transfer level: Needs assistance   Transfers: Sit to/from Stand Sit to Stand: Min assist         General transfer comment: multiple attempts and heavy use of momentum required; unable to complete without UE support.  Min assist for LE position to maximize efficiency with movement  transition  Ambulation/Gait Ambulation/Gait assistance: Min assist;Mod assist Ambulation Distance (Feet): 50 Feet Assistive device: 1 person hand held assist       General Gait Details: mixed step to vs. reciprocal stepping pattern.  Excessive R lateral weight shift.  Constantly reaching for walls/furniture for external stabilization.  Generally unsteady and unsafe; high risk for falls.  Stairs            Wheelchair Mobility    Modified Rankin (Stroke Patients Only)       Balance Overall balance assessment: Needs assistance Sitting-balance support: No upper extremity supported;Feet supported Sitting balance-Leahy Scale: Good     Standing balance support: No upper extremity supported Standing balance-Leahy Scale: Poor                   Standardized Balance Assessment Standardized Balance Assessment :  (very minimal/no functional reach outside immediate BOS in standing; excessive posteiror weight shift. High risk for posterior LOB.)           Pertinent Vitals/Pain Pain Assessment: Faces Faces Pain Scale: Hurts a little bit Pain Location: L chest/ribs, elbow Pain Descriptors / Indicators: Aching;Sore Pain Intervention(s): Limited activity within patient's tolerance;Monitored during session;Repositioned    Home Living Family/patient expects to be discharged to:: Private residence Living Arrangements: Alone               Additional Comments: patient unable to provide accurate social history; will confirm with family as available.  Per chart, lives alone; ambulates indep.    Prior Function Level of Independence: Independent         Comments: patient unable  to provide accurate social history; will confirm with family as available.  Per chart, lives alone; ambulates indep.     Hand Dominance        Extremity/Trunk Assessment   Upper Extremity Assessment: Overall WFL for tasks assessed;Generalized weakness (generally at least 3/5 throughout  UEs; shoulder elevation to shoulder height only bilat (arthritic changes))           Lower Extremity Assessment: Overall WFL for tasks assessed;Generalized weakness (globally at least 3+/5; difficulty following commands for formal MMT.)         Communication   Communication: No difficulties  Cognition Arousal/Alertness: Awake/alert Behavior During Therapy: WFL for tasks assessed/performed (pleasant and cooperative) Overall Cognitive Status:  (oriented to self only; consistently follows one-step commands.  Noted deficits in immediate and delayed recall of new information.  Limited insight into deficits/safety needs)       Memory: Decreased short-term memory              General Comments      Exercises Other Exercises Other Exercises: Gait >100' with RW, min assist--improved balance and overall safety; however, continues to require +1 for for optimal safety.  Min assist for balance with turns, obstacle negotiation and surfaces changes; poor ability to respond to external perturbations or complete any dynamic gait /divided attention tasks. Other Exercises: Toilet transfer, ambulatory with RW, min assist for balance with path negotaition; min assist for sit/stand with grab bar from standard toilet height.  (10 minutes)      Assessment/Plan    PT Assessment Patient needs continued PT services  PT Diagnosis Difficulty walking;Generalized weakness   PT Problem List Decreased range of motion;Decreased activity tolerance;Decreased balance;Decreased mobility;Decreased strength;Decreased coordination;Decreased cognition;Decreased knowledge of use of DME;Decreased safety awareness  PT Treatment Interventions DME instruction;Gait training;Stair training;Functional mobility training;Therapeutic activities;Therapeutic exercise;Patient/family education;Balance training   PT Goals (Current goals can be found in the Care Plan section) Acute Rehab PT Goals Patient Stated Goal: "to get my  shoes and my snuff" PT Goal Formulation: With patient Time For Goal Achievement: 07/22/14 Potential to Achieve Goals: Good    Frequency Min 2X/week   Barriers to discharge Decreased caregiver support      Co-evaluation               End of Session Equipment Utilized During Treatment: Gait belt Activity Tolerance: Patient tolerated treatment well Patient left: in chair;with call bell/phone within reach;with chair alarm set Nurse Communication: Mobility status    Functional Limitation: Mobility: Walking and moving around Mobility: Walking and Moving Around Current Status [phone]): At least 60 percent but less than 80 percent impaired, limited or restricted Mobility: Walking and Moving Around Goal Status [phone]): At least 1 percent but less than 20 percent impaired, limited or restricted    Time: 0939-1006 PT Time Calculation (min) (ACUTE ONLY): 27 min   Charges:   PT Evaluation $Initial PT Evaluation Tier I: 1 Procedure PT Treatments $Gait Training: 8-22 mins   PT G Codes:   PT G-Codes **NOT FOR INPATIENT CLASS** Functional Limitation: Mobility: Walking and moving around Mobility: Walking and Moving Around Current Status ([id number]): At least 60 percent but less than 80 percent impaired, limited or restricted Mobility: Walking and Moving Around Goal Status [phone]): At least 1 percent but less than 20 percent impaired, limited or restricted   Topher Buenaventura H. Owens Shark, PT, DPT 07/08/2014, 10:32 AM [phone]

## 2014-07-08 NOTE — Care Management (Signed)
Physical therapy evaluation completed. Recommends skilled nursing facility. Clinical Social Worker updated. Shelbie Ammons RN MSN Care Management [phone]

## 2014-07-08 NOTE — Clinical Social Work Note (Signed)
Clinical Social Work Assessment  Patient Details  Name: Tonya Snyder MRN: [id number] Date of Birth: [birth year]  Date of referral:                  Reason for consult:  Facility Placement                Permission sought to share information with:  Case Manager, Family Supports Permission granted to share information::  Yes, Verbal Permission Granted  Name::      [facility] )   Housing/Transportation Living arrangements for the past 2 months:  Single Family Home Source of Information:  Patient, Adult Children Patient Interpreter Needed:  None Criminal Activity/Legal Involvement Pertinent to Current Situation/Hospitalization:  No - Comment as needed Significant Relationships:  Adult Children, Other Family Members Lives with:  Other (Comment) (graddaughter, Baker Janus) Do you feel safe going back to the place where you live?  Yes Need for family participation in patient care:  Yes (Comment)  Care giving concerns:  None identified.    Social Worker assessment / plan:  Holiday representative met with pt to address consult. CSW introduced herself and explained role of social work. PT is recommending SNF. CSW spoke with pt's son. Pt lives with her granddaughter. Pt has been to SNF in the past. Pt's son is agreeable to SNF placement. CSW will complete FL2 and place on chart for MD's signature. CSW will initiate SNF search and follow up with bed offers. CSW will continue to follow.   Employment status:  Retired Forensic psychologist PT Recommendations:  [place] / Referral to community resources:  [place]  Patient/Family's Response to care:  Pt's son was very pleasant and appreciative of CSW's support.   Patient/Family's Understanding of and Emotional Response to Diagnosis, Current Treatment, and Prognosis:  Pt's son is understanding for need for SNF placement.   Emotional Assessment Appearance:  Appears stated  age Attitude/Demeanor/Rapport:  Other (appropriate, pleasant) Affect (typically observed):  Accepting Orientation:  Oriented to Self Alcohol / Substance use:  Never Used Psych involvement (Current and /or in the community):  No (Comment)  Discharge Needs  Concerns to be addressed:  Financial / Insurance Concerns Readmission within the last 30 days:  No Current discharge risk:   (need for PT at discharge) Barriers to Discharge:  Barriers Resolved   Darden Dates, LCSW 07/08/2014, 1:57 PM

## 2014-07-08 NOTE — Progress Notes (Signed)
Tonya Snyder NAME: Tonya Snyder    MR#:  [id number]  DATE OF BIRTH:  11/12/1920  SUBJECTIVE:  CHIEF COMPLAINT:   Chief Complaint  Patient presents with  . Fall   Pt. Here s/p fall and incidentally noted to have pneumonia.  Poor historian and sitting up in bed eating breakfast.  No complaints presently.   REVIEW OF SYSTEMS:    Review of Systems  Unable to perform ROS due to advanced dementia and poor historian.   Nutrition: Regular Tolerating Diet: Yes Tolerating PT: Await Eval.   DRUG ALLERGIES:  No Known Allergies  VITALS:  Blood pressure 156/62, pulse 90, temperature 99.2 F (37.3 C), temperature source Oral, resp. rate 16, height 5\' 5"  (1.651 m), weight 39.917 kg (88 lb), SpO2 100 %.  PHYSICAL EXAMINATION:   Physical Exam  GENERAL:  81 y.o.-year-old patient lying in the bed with no acute distress.  EYES: Pupils equal, round, reactive to light and accommodation. No scleral icterus. Extraocular muscles intact.  HEENT: Head atraumatic, normocephalic. Oropharynx and nasopharynx clear.  NECK:  Supple, no jugular venous distention. No thyroid enlargement, no tenderness.  LUNGS: Normal breath sounds bilaterally, no wheezing, rales, rhonchi. No use of accessory muscles of respiration.  CARDIOVASCULAR: S1, S2 normal. No murmurs, rubs, or gallops.  ABDOMEN: Soft, nontender, nondistended. Bowel sounds present. No organomegaly or mass.  EXTREMITIES: No cyanosis, clubbing or edema b/l.    NEUROLOGIC: Cranial nerves II through XII are intact. No focal Motor or sensory deficits b/l.  Globally weak PSYCHIATRIC: The patient is alert and oriented x 1. Good affect.  SKIN: No obvious rash, lesion, or ulcer.    LABORATORY PANEL:   CBC  Recent Labs Lab 07/08/14 0430  WBC 4.3  HGB 12.1  HCT 34.9*  PLT 166    ------------------------------------------------------------------------------------------------------------------  Chemistries   Recent Labs Lab 07/07/14 0759 07/07/14 2309 07/08/14 0430  NA 134*  --  135  K 3.1*  --  3.5  CL 100*  --  104  CO2 25  --  24  GLUCOSE 103*  --  81  BUN 8  --  11  CREATININE 0.54  --  0.77  CALCIUM 8.8*  --  8.2*  MG  --  1.6*  --   AST 35  --   --   ALT 19  --   --   ALKPHOS 77  --   --   BILITOT 0.6  --   --    ------------------------------------------------------------------------------------------------------------------  Cardiac Enzymes  Recent Labs Lab 07/07/14 0945  TROPONINI 0.04*   ------------------------------------------------------------------------------------------------------------------  RADIOLOGY:  Dg Chest 1 View  07/07/2014   CLINICAL DATA:  Multiple recent falls, fall today  EXAM: CHEST  1 VIEW  COMPARISON:  06/24/2009  FINDINGS: Cardiomediastinal silhouette is stable. Mild hyperinflation again noted. There is patchy atelectasis or infiltrate in left base laterally. Follow-up to resolution is recommended. No pulmonary edema. Atherosclerotic calcifications of thoracic aorta.  IMPRESSION: Patchy atelectasis or infiltrate in left base laterally. Follow-up to resolution is recommended. No pulmonary edema.   Electronically Signed   By: Tonya Snyder M.D.   On: 07/07/2014 12:25   Dg Pelvis 1-2 Views  07/07/2014   CLINICAL DATA:  Tripped and fell today.  EXAM: PELVIS - 1-2 VIEW  COMPARISON:  05/10/2014  FINDINGS: Exam demonstrates mild diffuse decreased bone mineralization. There are mild degenerate changes of the left hip. Unipolar right hip arthroplasty intact and unchanged. There  is no acute fracture or dislocation. There are moderate degenerative changes of the spine and sacroiliac joints. Mild degenerate change of the symphysis pubis joint.  IMPRESSION: No acute findings.  Right hip arthroplasty intact and unchanged. Mild  degenerative change of the left hip.   Electronically Signed   By: Tonya Snyder M.D.   On: 07/07/2014 12:25     ASSESSMENT AND PLAN:   81 yo female w/ hx of dementia, Hypertension, hyperlipidemia, came into hospital due to a fall and incidentally noted to have pneumonia on CXR.   1. Pneumonia - incidentally noted ton CXR on admission.  - clinically pt. Has no shortness of breath, cough.  Afebrile, WBC count normal.  - cont. IV Ceftriaxone, zithromax. Cultures (-) so far.   2. S/p Fall - hx of dementia.  - await PT eval.  ?? Need for SNF (vs) Home health.   3. Heme + stools - Hg. Stable.  Seen by GI and no plans on intervention given advanced dementia.  - GI discussed w/ pt's son and he does not want any aggressive intervention.  - GI has signed off.   4. HTN - cont. Benazepril, norvasc. - cont. PRN hydralazine.   5. Dementia - cont. Remeron  6. Hyperlipidemia - cont. Simvastatin  All the records are reviewed and case discussed with Care Management/Social Workerr. Management plans discussed with the patient, family and they are in agreement.  CODE STATUS: Full  DVT Prophylaxis: Lovenox.   TOTAL TIME TAKING CARE OF THIS PATIENT: 30 minutes.   POSSIBLE D/C IN 1-2 DAYS, await PT eval.    Tonya Snyder M.D on 07/08/2014 at 9:52 AM  Between 7am to 6pm - Pager - [phone]  After 6pm go to [email or web address] - password [email or web address] [facility]  Office  [phone]  CC: Primary care physician; Lavera Guise, MD

## 2014-07-08 NOTE — Consult Note (Signed)
Gastroenterology Consultation  Referring Provider:    Dr Bridgett Larsson Primary Snyder Physician:  Lavera Guise, MD Primary Gastroenterologist:  n/a      Reason for Consultation:     Positive FOBT  Date of Admission:  07/07/2014 Date of Consultation:  07/08/2014         HPI:   Tonya Snyder is a 81 y.o. female admitted with multiple falls, weakness, pneumonia, & dementia found to have hemoccult positive stool.  Pt has significant dementia for about the past 5 years & is unable to answer my questions.  History is obtained from Tonya Snyder, her son, via telephone.  Son denies any rectal bleeding or melena.  He states she is "afraid to eat as she is afraid to put on a pound".  He is unsure whether she has ever had a colonosocopy. No hx of PUD or colon polyps.  No hx of cancer.  He states she is not eating 3 meals a day.  Denies heartburn, indigestion, nausea, vomiting, dysphagia, odynophagia.  She has anorexia.  She has chronic constipation & was taking milk of magnesia & docusate & it helped.  Denies any recent diarrhea.  She takes a ASA 81mg  daily.  No other NSAIDS.  Past Medical History  Diagnosis Date  . Hypertension   . Dementia   . Hyperlipidemia     Past Surgical History  Procedure Laterality Date  . Abdominal hysterectomy    . Hip surgery      Prior to Admission medications   Medication Sig Start Date End Date Taking? Authorizing Provider  amLODipine (NORVASC) 5 MG tablet Take 1 tablet by mouth daily. 10/05/13  Yes Historical Provider, MD  aspirin EC 81 MG tablet Take 81 mg by mouth daily.   Yes Historical Provider, MD  benazepril (LOTENSIN) 10 MG tablet Take 1 tablet by mouth daily. 10/05/13  Yes Historical Provider, MD  docusate sodium (COLACE) 100 MG capsule Take 100 mg by mouth at bedtime.   Yes Historical Provider, MD  mirtazapine (REMERON) 15 MG tablet Take 0.5 tablets by mouth at bedtime.    Yes Historical Provider, MD  Multiple Vitamins-Minerals (MULTIVITAMIN PO) Take 1 tablet by  mouth daily. 07/27/13  Yes Historical Provider, MD  potassium chloride (K-DUR,KLOR-CON) 10 MEQ tablet Take 1 tablet by mouth daily. 10/05/13  Yes Historical Provider, MD  simvastatin (ZOCOR) 20 MG tablet Take 1 tablet by mouth at bedtime.    Yes Historical Provider, MD  hydrochlorothiazide (HYDRODIURIL) 25 MG tablet Take 1 tablet by mouth daily. 10/05/13   Historical Provider, MD  hydrocortisone cream 1 % Apply 1 application topically 4 (four) times daily.    Historical Provider, MD  levofloxacin (LEVAQUIN) 750 MG tablet Take 1 tablet (750 mg total) by mouth daily. 07/07/14   Earleen Newport, MD    History reviewed. No pertinent family history. There is no known family history of colorectal carcinoma , liver disease, or inflammatory bowel disease.  History  Substance Use Topics  . Smoking status: Former Research scientist (life sciences)  . Smokeless tobacco: Current User    Types: Snuff  . Alcohol Use: No    Allergies as of 07/07/2014  . (No Known Allergies)    Review of Systems:    All systems reviewed and negative except where noted in HPI per son.  Unable to obtain from patient.   Physical Exam:  Vital signs in last 24 hours: Temp:  [98.6 F (37 C)-99.2 F (37.3 C)] 99.2 F (37.3 C) (05/26 0455)  Pulse Rate:  [82-95] 90 (05/25 2107) Resp:  [16-20] 16 (05/26 0455) BP: (114-209)/(62-116) 156/62 mmHg (05/26 0455) SpO2:  [95 %-100 %] 100 % (05/26 0455) Weight:  [39.917 kg (88 lb)] 39.917 kg (88 lb) (05/25 1610) Last BM Date: 07/07/14 General:   Alert, thin, pleasantly confused and cooperative in NAD Head:  Normocephalic and atraumatic. Eyes:  Sclera clear, no icterus.   Conjunctiva pink. Ears:  Normal auditory acuity. Nose:  No deformity, discharge, or lesions. Mouth:  No deformity or lesions,oropharynx pink & moist. Neck:  Supple; no masses or thyromegaly. Lungs:  Respirations even and unlabored.  Clear throughout to auscultation.   No wheezes, crackles, or rhonchi. No acute distress. Heart:   Regular rate and rhythm; no murmurs, clicks, rubs, or gallops. Abdomen:  Normal bowel sounds.  No bruits.  Soft, non-tender and non-distended without masses, hepatosplenomegaly or hernias noted.  No guarding or rebound tenderness.  Rectal:  Deferred. Msk:  Symmetrical without gross deformities.  Good, equal movement & strength bilaterally. Pulses:  Normal pulses noted. Extremities:  No clubbing or edema.  No cyanosis. Neurologic:  Alert and oriented x1;  At her baseline neurologically. Skin:  Intact without significant lesions or rashes.  No jaundice. Lymph Nodes:  No significant cervical adenopathy. Psych:  Alert and cooperative. Normal mood and affect.  LAB RESULTS:  Recent Labs  07/07/14 0759 07/08/14 0430  WBC 7.4 4.3  HGB 12.6 12.1  HCT 36.8 34.9*  PLT 169 166   BMET  Recent Labs  07/07/14 0759 07/08/14 0430  NA 134* 135  K 3.1* 3.5  CL 100* 104  CO2 25 24  GLUCOSE 103* 81  BUN 8 11  CREATININE 0.54 0.77  CALCIUM 8.8* 8.2*   LFT  Recent Labs  07/07/14 0759  PROT 6.9  ALBUMIN 3.6  AST 35  ALT 19  ALKPHOS 77  BILITOT 0.6   PT/INR  Recent Labs  07/07/14 0759  LABPROT 14.2  INR 1.08    STUDIES: Dg Chest 1 View  07/07/2014   CLINICAL DATA:  Multiple recent falls, fall today  EXAM: CHEST  1 VIEW  COMPARISON:  06/24/2009  FINDINGS: Cardiomediastinal silhouette is stable. Mild hyperinflation again noted. There is patchy atelectasis or infiltrate in left base laterally. Follow-up to resolution is recommended. No pulmonary edema. Atherosclerotic calcifications of thoracic aorta.  IMPRESSION: Patchy atelectasis or infiltrate in left base laterally. Follow-up to resolution is recommended. No pulmonary edema.   Electronically Signed   By: Lahoma Crocker M.D.   On: 07/07/2014 12:25   Dg Pelvis 1-2 Views  07/07/2014   CLINICAL DATA:  Tripped and fell today.  EXAM: PELVIS - 1-2 VIEW  COMPARISON:  05/10/2014  FINDINGS: Exam demonstrates mild diffuse decreased bone  mineralization. There are mild degenerate changes of the left hip. Unipolar right hip arthroplasty intact and unchanged. There is no acute fracture or dislocation. There are moderate degenerative changes of the spine and sacroiliac joints. Mild degenerate change of the symphysis pubis joint.  IMPRESSION: No acute findings.  Right hip arthroplasty intact and unchanged. Mild degenerative change of the left hip.   Electronically Signed   By: Marin Olp M.D.   On: 07/07/2014 12:25      Impression / Plan:   TAWANDA SCHALL is a 81 y.o. y/o female with advanced dementia, multiple frequent falls, pneumonia & weakness found to have Hemoccult positive stool. I discussed her Snyder with her son, Tonya Snyder. He does not believe she has had a  recent colonoscopy.  Given her age and multiple comorbidities, he does not want to pursue colonoscopy or any endoscopic evaluation at this time. This is very reasonable. He understands we cannot diagnose culprit of her Hemoccult-positive stool including colorectal carcinoma or peptic ulcer disease, among other GI tract issues.  Her hemoglobin is stable. I will sign off and if you have any further questions please do hesitate to contact us.  Thank you for involving me in the Snyder of this patient.    Vickey Huger, NP  07/08/2014, [place]  [place], [place] [address] Phone: [phone] Fax : [phone]

## 2014-07-08 NOTE — Progress Notes (Signed)
Initial Nutrition Assessment  DOCUMENTATION CODES:  Severe malnutrition in context of chronic illness  INTERVENTION:   (Medical Nutrition Supplement: Recommend ensure enlive BID for added nutrition, Meals and snacks: cater to pt prefences)  NUTRITION DIAGNOSIS:  Inadequate oral intake related to chronic illness as evidenced by per patient/family report.    GOAL:  Patient will meet greater than or equal to 90% of their needs    MONITOR:   (Energy intake, Electrolyte and renal profile, Anthropometric)  REASON FOR ASSESSMENT:  Malnutrition Screening Tool    ASSESSMENT:  Pt admitted s/p fall, pneumonia, heme + stool. Per GI not planning further work up.  Noted per GI note son reporting pt afraid to eat secondary to gaining wt.  Grand-daughter at bedside and confirms poor po intake for quite some time now.  Noted per I and O sheet intake 100%, 70% and 100% recorded.    Past Medical History  Diagnosis Date  . Hypertension   . Dementia   . Hyperlipidemia    Electrolyte and Renal Profile:    Recent Labs Lab 07/07/14 0759 07/07/14 2309 07/08/14 0430  BUN 8  --  11  CREATININE 0.54  --  0.77  NA 134*  --  135  K 3.1*  --  3.5  MG  --  1.6*  --    Medications: colace, remeron, KCL  Height:  Ht Readings from Last 1 Encounters:  07/07/14 5\' 5"  (1.651 m)    Weight:  Wt Readings from Last 1 Encounters:  07/07/14 88 lb (39.917 kg)    Grand-daughter reports weight of 93 pounds 2 months ago, current wt 88 pounds (5% weight loss in the last 2 months)  Nutrition-Focused physical exam completed. Findings are severe and moderate fat depletion, severe muscle depletion, and no edema.     BMI:  Body mass index is 14.64 kg/(m^2).  Estimated Nutritional Needs:  Kcal:  Using IBW of 57kg BEE 985 kcals (IF 1.0-1.2, AF 1.3) 1280-1536 kcals/d  Protein:  (1.2-1.5 g/d) 68-86 g/d   Fluid:  (25-46ml/kg) 1425-1787ml/d  Skin:  Reviewed, no issues  Diet Order:  Diet 2  gram sodium Room service appropriate?: Yes; Fluid consistency:: Thin  EDUCATION NEEDS:  No education needs identified at this time   Intake/Output Summary (Last 24 hours) at 07/08/14 1459 Last data filed at 07/08/14 1300  Gross per 24 hour  Intake    720 ml  Output      0 ml  Net    720 ml      HIGH Care Level Rolonda Pontarelli B. Zenia Resides, [place], [place] (pager)

## 2014-07-08 NOTE — Plan of Care (Signed)
Problem: Discharge Progression Outcomes Goal: Other Discharge Outcomes/Goals Outcome: Progressing Patient alert but confused, eating well.  Worked with PT 1 assist with walker, very unsteady.  Very pleasantly confused.  Blood pressure much improved today.  PT worked with patient and SW talked with family and in agreement to some kind of rehab after discharge.

## 2014-07-08 NOTE — Clinical Social Work Placement (Signed)
  CLINICAL SOCIAL WORK PLACEMENT  NOTE  Date:  07/08/2014  Patient Details  Name: ARLANDA SHIPLETT MRN: [id number] Date of Birth: 22-Mar-1920  Clinical Social Work is seeking post-discharge placement for this patient at the [place] level of care (*CSW will initial, date and re-position this form in  chart as items are completed):  Yes   Patient/family provided with [place] Work Department's list of facilities offering this level of care within the geographic area requested by the patient (or if unable, by the patient's family).  Yes   Patient/family informed of their freedom to choose among providers that offer the needed level of care, that participate in Medicare, Medicaid or managed care program needed by the patient, have an available bed and are willing to accept the patient.  Yes   Patient/family informed of [facility]'s ownership interest in [facility] and [facility], as well as of the fact that they are under no obligation to receive care at these facilities.  PASRR submitted to EDS on       PASRR number received on       Existing PASRR number confirmed on 07/08/14     FL2 transmitted to all facilities in geographic area requested by pt/family on 07/08/14     FL2 transmitted to all facilities within larger geographic area on       Patient informed that his/her managed care company has contracts with or will negotiate with certain facilities, including the following:            Patient/family informed of bed offers received.  Patient chooses bed at       Physician recommends and patient chooses bed at      Patient to be transferred to   on  .  Patient to be transferred to facility by       Patient family notified on   of transfer.  Name of family member notified:        PHYSICIAN     Please sign FL2, as it is on the chart.  Additional Comment:  [employer] auth needed for placement.    _______________________________________________ Darden Dates, LCSW 07/08/2014, 2:24 PM

## 2014-07-09 DIAGNOSIS — E43 Unspecified severe protein-calorie malnutrition: Secondary | ICD-10-CM | POA: Insufficient documentation

## 2014-07-09 LAB — MISC LABCORP TEST (SEND OUT)
LABCORP TEST CODE: 18788
Labcorp test code: 182246

## 2014-07-09 LAB — CREATININE, SERUM
Creatinine, Ser: 0.63 mg/dL (ref 0.44–1.00)
GFR calc Af Amer: >60 mL/min (ref >60)
GFR calc non Af Amer: >60 mL/min (ref >60)

## 2014-07-09 LAB — HIV ANTIBODY (ROUTINE TESTING W REFLEX): HIV Screen 4th Generation wRfx: NONREACTIVE

## 2014-07-09 MED ORDER — LEVOFLOXACIN 500 MG PO TABS: 500.0000 mg | ORAL_TABLET | Freq: Every day | ORAL | Status: DC

## 2014-07-09 MED ORDER — LEVOFLOXACIN 500 MG PO TABS
500.0000 mg | ORAL_TABLET | Freq: Every day | ORAL | Status: DC
  Administered 2014-07-09 – 2014-07-14 (×6): 500 mg via ORAL
  Filled 2014-07-09 (×6): qty 1

## 2014-07-09 NOTE — Progress Notes (Signed)
Physical Therapy Treatment Patient Details Name: Tonya Snyder MRN: [id number] DOB: [birth year] Today's Date: 07/09/2014    History of Present Illness presented to ER status post fall in home environment, decreased PO intake and progressive weakness; admitted with L lower lobe PNA.  Also noted with heme positive stools; per GI notes, family declines invasive intervention at this time.  HgB stable; no further work up indicated.    PT Comments    Patient is pleasant but confused upon arrival. Family in room, but as additional family arrives, she is only able to recognize granddaughter, but not daughter. Pt is aware of recent fall now, but still confused in general. Pt tolerating treatment session well, motivated by family and PT and able to complete entire PT sesssion as planned. Pt continues to make progress toward goals as evidenced by improved AMB distance. Pt's greatest limitations continue to be cognition, balance, and poor activity tolerance which continue to limit ability to perform ADL and household mobility at baseline function. Patient presenting with impairment of strength, pain, range of motion, balance, and activity tolerance, limiting ability to perform ADL and mobility tasks at  baseline level of function. Patient will benefit from skilled intervention to address the above impairments and limitations, in order to restore to prior level of function, improve patient safety upon discharge, and to decrease caregiver burden.    Follow Up Recommendations  SNF     Equipment Recommendations  Cane (Pt in med stage demetia, unlikley to respond wel to new AD training at this time. )    Recommendations for Other Services       Precautions / Restrictions Precautions Precautions: Fall Restrictions Weight Bearing Restrictions: No    Mobility  Bed Mobility Overal bed mobility:  (Received up in chair.)                Transfers Overall transfer level: Needs assistance Equipment  used:  (HHA; requires heavy verbal cues for motivation. ) Transfers: Sit to/from Stand Sit to Stand: Min assist         General transfer comment: Inititally presenting with trouble with coming to full upright stance, expressing hesitance with activty in relation to recent fall. Pt losing balance backwards requiring PT to correct, until patient finalyl stabilized.   Ambulation/Gait Ambulation/Gait assistance: Min assist Ambulation Distance (Feet): 350 Feet Assistive device: 1 person hand held assist Gait Pattern/deviations: Drifts right/left Gait velocity: 0.5 ffet/sec or 0.77m/s Gait velocity interpretation: <1.8 ft/sec, indicative of risk for recurrent falls General Gait Details: Step-to bilaterally, very short steps with bradykinesia. Constantly reaching for walls/furniture for external stabilization.  Generally unsteady and unsafe; high risk for falls.Moderately confused, periodically stopping adn having to be reoriented to task.    Stairs            Wheelchair Mobility    Modified Rankin (Stroke Patients Only)       Balance Overall balance assessment: Needs assistance Sitting-balance support: Feet supported;Single extremity supported Sitting balance-Leahy Scale: Fair     Standing balance support: Single extremity supported Standing balance-Leahy Scale: Poor                      Cognition Arousal/Alertness: Awake/alert Behavior During Therapy: WFL for tasks assessed/performed Overall Cognitive Status: History of cognitive impairments - at baseline (Family reports more confusion than typical)                      Exercises      General  Comments        Pertinent Vitals/Pain Pain Assessment: No/denies pain    Home Living                      Prior Function            PT Goals (current goals can now be found in the care plan section) Acute Rehab PT Goals Patient Stated Goal: "to get my shoes and my snuff" PT Goal Formulation:  With patient Time For Goal Achievement: 07/22/14 Potential to Achieve Goals: Good Progress towards PT goals: Progressing toward goals    Frequency  Min 2X/week    PT Plan Current plan remains appropriate    Co-evaluation             End of Session Equipment Utilized During Treatment: Gait belt Activity Tolerance: Patient tolerated treatment well Patient left: in chair;with family/visitor present;with call bell/phone within reach;with chair alarm set     Time: 2956-2130 PT Time Calculation (min) (ACUTE ONLY): 23 min  Charges:  $Therapeutic Activity: 23-37 mins                    G Codes:      Sameul Tagle C 28-Jul-2014, 4:31 PM  4:34 PM  Etta Grandchild, PT, DPT [place] License # [id number]

## 2014-07-09 NOTE — Plan of Care (Signed)
Problem: Discharge Progression Outcomes Goal: Other Discharge Outcomes/Goals Plan of Care Progress to Goal:  Pt o2 sats are wnl, on antibiotics per orders, pt confused tonight,attempted to reorient at intervals.  Bed alarm in use for pts safety, no evidence of injury noted, pt with elevated BP, PRN med given with effective results.

## 2014-07-09 NOTE — Discharge Summary (Addendum)
[facility] [place] at [place] NAME: Tonya Snyder    MR#:  [id number]  DATE OF BIRTH:  [birth year]  DATE OF ADMISSION:  07/07/2014 ADMITTING PHYSICIAN: Demetrios Loll, MD  DATE OF DISCHARGE: 07/14/2014  PRIMARY CARE PHYSICIAN: Lavera Guise, MD    ADMISSION DIAGNOSIS:  Heme positive stool [R19.5] Elevated troponin I level [R79.89] Fall, initial encounter [W19.XXXA]  DISCHARGE DIAGNOSIS:  Active Problems:   Pneumonia   Fall   Hypokalemia   Protein-calorie malnutrition, severe   SECONDARY DIAGNOSIS:   Past Medical History  Diagnosis Date  . Hypertension   . Dementia   . Hyperlipidemia     HOSPITAL COURSE:   81 yo female w/ hx of dementia, Hypertension, hyperlipidemia, came into hospital due to a fall and incidentally noted to have pneumonia on CXR.   1. Pneumonia - incidentally noted on CXR on admission.  Finished course of antibiotic  2. S/p Fall - patient has advanced dementia which probably led to the patient's fall. Patient was seen by physical therapy and he recommended short-term rehabilitation which is where she is presently being discharged.  3. Heme + stools - Hg. Stable. Seen by GI and no plans on intervention given advanced dementia.  - GI discussed w/ pt's son and he did not want any aggressive intervention.   4. HTN - hemodynamically stable patient will continue benazepril and Norvasc and hydrochlorothiazide   5. Dementia - cont. Remeron  6. Hyperlipidemia - cont. Simvastatin  DISCHARGE CONDITIONS:   Stable  DRUG ALLERGIES:  No Known Allergies  DISCHARGE MEDICATIONS:   Current Discharge Medication List    CONTINUE these medications which have NOT CHANGED   Details  amLODipine (NORVASC) 5 MG tablet Take 1 tablet by mouth daily.    aspirin EC 81 MG tablet Take 81 mg by mouth daily.    benazepril (LOTENSIN) 10 MG tablet Take 1 tablet by mouth daily.    docusate sodium (COLACE) 100 MG capsule Take 100 mg  by mouth at bedtime.    mirtazapine (REMERON) 15 MG tablet Take 0.5 tablets by mouth at bedtime.     Multiple Vitamins-Minerals (MULTIVITAMIN PO) Take 1 tablet by mouth daily.    potassium chloride (K-DUR,KLOR-CON) 10 MEQ tablet Take 1 tablet by mouth daily.    simvastatin (ZOCOR) 20 MG tablet Take 1 tablet by mouth at bedtime.     hydrochlorothiazide (HYDRODIURIL) 25 MG tablet Take 1 tablet by mouth daily.    hydrocortisone cream 1 % Apply 1 application topically 4 (four) times daily.         DISCHARGE INSTRUCTIONS:   DIET:  Cardiac diet  DISCHARGE CONDITION:  Stable  ACTIVITY:  Activity as tolerated  OXYGEN:  Home Oxygen: No.   Oxygen Delivery: room air  DISCHARGE LOCATION:  nursing home   If you experience worsening of your admission symptoms, develop shortness of breath, life threatening emergency, suicidal or homicidal thoughts you must seek medical attention immediately by calling 911 or calling your MD immediately  if symptoms less severe.  You Must read complete instructions/literature along with all the possible adverse reactions/side effects for all the Medicines you take and that have been prescribed to you. Take any new Medicines after you have completely understood and accpet all the possible adverse reactions/side effects.   Please note  You were cared for by a hospitalist during your hospital stay. If you have any questions about your discharge medications or the care you received while you  were in the hospital after you are discharged, you can call the unit and asked to speak with the hospitalist on call if the hospitalist that took care of you is not available. Once you are discharged, your primary care physician will handle any further medical issues. Please note that NO REFILLS for any discharge medications will be authorized once you are discharged, as it is imperative that you return to your primary care physician (or establish a relationship with a  primary care physician if you do not have one) for your aftercare needs so that they can reassess your need for medications and monitor your lab values.     On the date of Discharge:   VITAL SIGNS:  Blood pressure 162/66, pulse 88, temperature 98.4 F (36.9 C), temperature source Oral, resp. rate 18, height 5\' 5"  (1.651 m), weight 39.917 kg (88 lb), SpO2 100 %.  I/O:    Intake/Output Summary (Last 24 hours) at 07/14/14 1048 Last data filed at 07/14/14 0750  Gross per 24 hour  Intake    360 ml  Output      0 ml  Net    360 ml    PHYSICAL EXAMINATION:  GENERAL:  81 y.o.-year-old patient lying in bed in no acute distress.  EYES: Pupils equal, round, reactive to light and accommodation. No scleral icterus. Extraocular muscles intact.  HEENT: Head atraumatic, normocephalic. Oropharynx and nasopharynx clear.  NECK:  Supple, no jugular venous distention. No thyroid enlargement, no tenderness.  LUNGS: Normal breath sounds bilaterally, no wheezing, rales,rhonchi.  No use of accessory muscles of respiration.  CARDIOVASCULAR: S1, S2 normal. No murmurs, rubs, or gallops.  ABDOMEN: Soft, non-tender, non-distended. Normal BS. No organomegaly or mass.  EXTREMITIES: No pedal edema, cyanosis, or clubbing.  NEUROLOGIC: Cranial nerves II through XII are intact. No focal motor or sensory defecits b/l.  PSYCHIATRIC: The patient is alert and oriented x 1. Good affect.  SKIN: No obvious rash, lesion, or ulcer.   DATA REVIEW:   CBC  Recent Labs Lab 07/08/14 0430  WBC 4.3  HGB 12.1  HCT 34.9*  PLT 166    Chemistries   Recent Labs Lab 07/07/14 2309 07/08/14 0430  07/14/14 0548  NA  --  135  --   --   K  --  3.5  --   --   CL  --  104  --   --   CO2  --  24  --   --   GLUCOSE  --  81  --   --   BUN  --  11  --   --   CREATININE  --  0.77  < > 0.59  CALCIUM  --  8.2*  --   --   MG 1.6*  --   --   --   < > = values in this interval not displayed.   Management plans discussed  with the patient and she is in agreement.  CODE STATUS:     Code Status Orders        Start     Ordered   07/07/14 1425  Full code   Continuous     07/07/14 1425    Advance Directive Documentation        Most Recent Value   Type of Advance Directive  Healthcare Power of Attorney   Pre-existing out of facility DNR order (yellow form or pink MOST form)     "MOST" Form in Place?  She remains at high risk for readmissions.  TOTAL TIME TAKING CARE OF THIS PATIENT: 40 minutes.    [facility], Kayan Blissett M.D on 07/14/2014 at 10:48 AM   [facility], Atha Starks, MD  Between 7am to 6pm - Pager - [phone]  After 6pm go to [email or web address] - password [email or web address] [facility]  Office  [phone]  CC: Primary care physician; Lavera Guise, MD

## 2014-07-09 NOTE — Clinical Social Work Note (Signed)
[employer] Josem Kaufmann is pending. CSW will continue to follow.   Darden Dates, MSW, LCSW Clinical Social Worker [phone]

## 2014-07-09 NOTE — Clinical Social Work Note (Signed)
CSW spoke with pt's granddaughter to provide bed offers. Pt's granddaughter chose [facility]. CSW updated facility, who started [place]. CSW will continue to follow.   Darden Dates, MSW, LCSW Clinical Social Worker  [phone]

## 2014-07-09 NOTE — Plan of Care (Signed)
Problem: Discharge Progression Outcomes Goal: Other Discharge Outcomes/Goals Outcome: Progressing Plan of progress to goal:  Alert. Oriented to self. Attempts made to orient. Ambulated down hallways with PT using gait belt. Tolerated well. OOB to chair.  Decreased appetite. Encouraged supplemental drink. Drank Ensure.

## 2014-07-09 NOTE — Progress Notes (Signed)
[facility]  Patient Status: Active   [facility] is providing the following services: SN, PT   If patient discharges after hours, please call [phone].   Tonya Snyder 07/09/2014, 9:31 AM

## 2014-07-09 NOTE — Progress Notes (Signed)
[place] [place] at [place] NAME: Tonya Snyder    MR#:  [id number]  DATE OF BIRTH:  1920-07-05  SUBJECTIVE:  CHIEF COMPLAINT:   Chief Complaint  Patient presents with  . Fall   Pt. Here s/p fall and incidentally noted to have pneumonia.  Poor historian and sitting up in chair eating lunch.  No complaints presently.   REVIEW OF SYSTEMS:    ROSdue to advanced dementia and poor historian.   Nutrition: Regular Tolerating Diet: Yes Tolerating PT: seen by PT and they recommend SNF/STR  DRUG ALLERGIES:  No Known Allergies  VITALS:  Blood pressure 160/66, pulse 90, temperature 99.1 F (37.3 C), temperature source Oral, resp. rate 18, height 5\' 5"  (1.651 m), weight 39.917 kg (88 lb), SpO2 100 %.  PHYSICAL EXAMINATION:   Physical Exam  GENERAL:  81 y.o.-year-old patient sitting up in chair in acute distress.  EYES: Pupils equal, round, reactive to light and accommodation. No scleral icterus. Extraocular muscles intact.  HEENT: Head atraumatic, normocephalic. Oropharynx and nasopharynx clear.  NECK:  Supple, no jugular venous distention. No thyroid enlargement, no tenderness.  LUNGS: Normal breath sounds bilaterally, no wheezing, rales, rhonchi. No use of accessory muscles of respiration.  CARDIOVASCULAR: S1, S2 normal. No murmurs, rubs, or gallops.  ABDOMEN: Soft, nontender, nondistended. Bowel sounds present. No organomegaly or mass.  EXTREMITIES: No cyanosis, clubbing or edema b/l.    NEUROLOGIC: Cranial nerves II through XII are intact. No focal Motor or sensory deficits b/l.  Globally weak PSYCHIATRIC: The patient is alert and oriented x 1. Good affect.  SKIN: No obvious rash, lesion, or ulcer.    LABORATORY PANEL:   CBC  Recent Labs Lab 07/08/14 0430  WBC 4.3  HGB 12.1  HCT 34.9*  PLT 166   ------------------------------------------------------------------------------------------------------------------  Chemistries    Recent Labs Lab 07/07/14 0759 07/07/14 2309 07/08/14 0430 07/09/14 0555  NA 134*  --  135  --   K 3.1*  --  3.5  --   CL 100*  --  104  --   CO2 25  --  24  --   GLUCOSE 103*  --  81  --   BUN 8  --  11  --   CREATININE 0.54  --  0.77 0.63  CALCIUM 8.8*  --  8.2*  --   MG  --  1.6*  --   --   AST 35  --   --   --   ALT 19  --   --   --   ALKPHOS 77  --   --   --   BILITOT 0.6  --   --   --    ------------------------------------------------------------------------------------------------------------------  Cardiac Enzymes  Recent Labs Lab 07/07/14 0945  TROPONINI 0.04*   ------------------------------------------------------------------------------------------------------------------  RADIOLOGY:  No results found.   ASSESSMENT AND PLAN:   81 yo female w/ hx of dementia, Hypertension, hyperlipidemia, came into hospital due to a fall and incidentally noted to have pneumonia on CXR.   1. Pneumonia - incidentally noted ton CXR on admission.  - clinically pt. Has no shortness of breath, cough.  Afebrile, WBC count normal.  - will switch from IV abx (Ceftriaxone, zithromax) to oral Levaquin today.   2. S/p Fall - hx of dementia.  - Seen by PT and he recommended short-term rehabilitation. Case management notified  3. Heme + stools - Hg. Stable.  Seen by GI and no plans on intervention  given advanced dementia.  - GI discussed w/ pt's son and he does not want any aggressive intervention.  - GI has signed off.   4. HTN - cont. Benazepril, norvasc. - cont. PRN hydralazine.   5. Dementia - cont. Remeron  6. Hyperlipidemia - cont. Simvastatin  All the records are reviewed and case discussed with Care Management/Social Workerr. Management plans discussed with the patient, family and they are in agreement.  CODE STATUS: Full  DVT Prophylaxis: Lovenox.   TOTAL TIME TAKING CARE OF THIS PATIENT: 25 minutes.   POSSIBLE D/C to short-term rehabilitation  tomorrow   [name] M.D on 07/09/2014 at 1:50 PM  Between 7am to 6pm - Pager - [phone]  After 6pm go to [email or web address] - password [email or web address] [facility]  Office  [phone]  CC: Primary care physician; Lavera Guise, MD

## 2014-07-10 NOTE — Progress Notes (Signed)
[place] at [place] NAME: Tonya Snyder    MR#:  [id number]  DATE OF BIRTH:  11-07-1920  SUBJECTIVE:  CHIEF COMPLAINT:   Chief Complaint  Patient presents with  . Fall   Pt. Here s/p fall and incidentally noted to have pneumonia.  No complaints presently and no acute events overnight.   REVIEW OF SYSTEMS:    Review of Systems  Unable to perform ROS due to advanced dementia and poor historian.   Nutrition: Regular Tolerating Diet: Yes Tolerating PT: seen by PT and they recommend SNF/STR  DRUG ALLERGIES:  No Known Allergies  VITALS:  Blood pressure 115/60, pulse 76, temperature 98.8 F (37.1 C), temperature source Oral, resp. rate 18, height 5\' 5"  (1.651 m), weight 39.917 kg (88 lb), SpO2 100 %.  PHYSICAL EXAMINATION:   Physical Exam  GENERAL:  81 y.o.-year-old patient lying in bed in no acute distress.  EYES: Pupils equal, round, reactive to light and accommodation. No scleral icterus. Extraocular muscles intact.  HEENT: Head atraumatic, normocephalic. Oropharynx and nasopharynx clear.  NECK:  Supple, no jugular venous distention. No thyroid enlargement, no tenderness.  LUNGS: Normal breath sounds bilaterally, no wheezing, rales, rhonchi. No use of accessory muscles of respiration.  CARDIOVASCULAR: S1, S2 normal. No murmurs, rubs, or gallops.  ABDOMEN: Soft, nontender, nondistended. Bowel sounds present. No organomegaly or mass.  EXTREMITIES: No cyanosis, clubbing or edema b/l.    NEUROLOGIC: Cranial nerves II through XII are intact. No focal Motor or sensory deficits b/l.  Globally weak PSYCHIATRIC: The patient is alert and oriented x 1. Good affect.  SKIN: No obvious rash, lesion, or ulcer.    LABORATORY PANEL:   CBC  Recent Labs Lab 07/08/14 0430  WBC 4.3  HGB 12.1  HCT 34.9*  PLT 166    ------------------------------------------------------------------------------------------------------------------  Chemistries   Recent Labs Lab 07/07/14 0759 07/07/14 2309 07/08/14 0430 07/09/14 0555  NA 134*  --  135  --   K 3.1*  --  3.5  --   CL 100*  --  104  --   CO2 25  --  24  --   GLUCOSE 103*  --  81  --   BUN 8  --  11  --   CREATININE 0.54  --  0.77 0.63  CALCIUM 8.8*  --  8.2*  --   MG  --  1.6*  --   --   AST 35  --   --   --   ALT 19  --   --   --   ALKPHOS 77  --   --   --   BILITOT 0.6  --   --   --    ------------------------------------------------------------------------------------------------------------------  Cardiac Enzymes  Recent Labs Lab 07/07/14 0945  TROPONINI 0.04*   ------------------------------------------------------------------------------------------------------------------  RADIOLOGY:  No results found.   ASSESSMENT AND PLAN:   81 yo female w/ hx of dementia, Hypertension, hyperlipidemia, came into hospital due to a fall and incidentally noted to have pneumonia on CXR.   1. Pneumonia - incidentally noted ton CXR on admission.  - clinically pt. Has no shortness of breath, cough.  Afebrile, WBC count normal.  - cont. Levaquin.    2. S/p Fall - hx of dementia.  - Seen by PT and he recommended short-term rehabilitation. - CM aware and awaiting insurance authorization for SNF placement.   3. Heme + stools - Hg. Stable.  Seen by GI and no  plans on intervention given advanced dementia.  - GI discussed w/ pt's son and he does not want any aggressive intervention.   4. HTN - cont. Benazepril, norvasc. - cont. PRN hydralazine.   5. Dementia - cont. Remeron  6. Hyperlipidemia - cont. Simvastatin  All the records are reviewed and case discussed with Care Management/Social Workerr. Management plans discussed with the patient, family and they are in agreement.  CODE STATUS: Full  DVT Prophylaxis: Lovenox.   TOTAL TIME  TAKING CARE OF THIS PATIENT: 25 minutes.   POSSIBLE D/C to short-term rehab on Tuesday 5/31 pending insurance approval.    [name] M.D on 07/10/2014 at 9:28 AM  Between 7am to 6pm - Pager - [phone]  After 6pm go to [email or web address] - password [email or web address] [facility]  Office  [phone]  CC: Primary care physician; Lavera Guise, MD

## 2014-07-10 NOTE — Plan of Care (Signed)
Problem: Discharge Progression Outcomes Goal: Discharge plan in place and appropriate Individualization of Care Outcome: Progressing Problem: Discharge Progression Outcomes Goal: Discharge plan in place and appropriate Individualization of Care Pt prefers to be called Tonya Snyder  history of hypertension, hyperlipidemia and dementia on home medications, Lives by self High Fall Precautions Goal: Other Discharge Outcomes/Goals Outcome: Progressing Plan of progress to goal: Alert. Oriented to self. Attempts made to orient. OOB to chair with one person assist. Decreased appetite. Encouraged supplemental drink. Drank Ensure. Family visiting.

## 2014-07-10 NOTE — Progress Notes (Signed)
Per Loie in admission at [facility], [employer] is still pending. Anticipate auth on Tuesday due to holiday on Monday. CSW will follow. Wandra Feinstein, MSW, LCSW [phone] (weekend coverage)

## 2014-07-10 NOTE — Plan of Care (Signed)
Problem: Discharge Progression Outcomes Goal: Other Discharge Outcomes/Goals Plan of Care Progress to Goal:  Pt o2 sats are wnl, on antibiotics per orders, pt confused tonight,attempted to reorient at intervals.  Bed alarm in use for pts safety, no evidence of injury noted, pt with elevated BP, PRN med given with effective results.

## 2014-07-11 NOTE — Plan of Care (Addendum)
Problem: Discharge Progression Outcomes Goal: Discharge plan in place and appropriate Individualization of Care Pt is a high fall risk. Offer toileting qx1hr with safety checks. Assist with bsc. H/O HTN, dementia, hyperlipidemia controlled with meds. Goal: Other Discharge Outcomes/Goals Plan of care progress  O2 in the high 90's on room air. No respiratory distress noted. Afebrile.Calm. Slept well.

## 2014-07-11 NOTE — Progress Notes (Signed)
[place] at [place] NAME: Tonya Snyder    MR#:  [id number]  DATE OF BIRTH:  [birth year]  SUBJECTIVE:  CHIEF COMPLAINT:   Chief Complaint  Patient presents with  . Fall   Pt. Here s/p fall and incidentally noted to have pneumonia.  No complaints presently.  A bit more awake today.   REVIEW OF SYSTEMS:    Review of Systems  Unable to perform ROS due to advanced dementia and poor historian.   Nutrition: Regular Tolerating Diet: Yes Tolerating PT: seen by PT and they recommend SNF/STR  DRUG ALLERGIES:  No Known Allergies  VITALS:  Blood pressure 163/92, pulse 101, temperature 97.6 F (36.4 C), temperature source Oral, resp. rate 18, height 5\' 5"  (1.651 m), weight 39.917 kg (88 lb), SpO2 100 %.  PHYSICAL EXAMINATION:   Physical Exam  GENERAL:  81 y.o.-year-old patient lying in bed in no acute distress.  EYES: Pupils equal, round, reactive to light and accommodation. No scleral icterus. Extraocular muscles intact.  HEENT: Head atraumatic, normocephalic. Oropharynx and nasopharynx clear.  NECK:  Supple, no jugular venous distention. No thyroid enlargement, no tenderness.  LUNGS: Normal breath sounds bilaterally, no wheezing, rales, rhonchi. No use of accessory muscles of respiration.  CARDIOVASCULAR: S1, S2 normal. No murmurs, rubs, or gallops.  ABDOMEN: Soft, nontender, nondistended. Bowel sounds present. No organomegaly or mass.  EXTREMITIES: No cyanosis, clubbing or edema b/l.    NEUROLOGIC: Cranial nerves II through XII are intact. No focal Motor or sensory deficits b/l.  Globally weak PSYCHIATRIC: The patient is alert and oriented x 1. Good affect.  SKIN: No obvious rash, lesion, or ulcer.    LABORATORY PANEL:   CBC  Recent Labs Lab 07/08/14 0430  WBC 4.3  HGB 12.1  HCT 34.9*  PLT 166    ------------------------------------------------------------------------------------------------------------------  Chemistries   Recent Labs Lab 07/07/14 0759 07/07/14 2309 07/08/14 0430 07/09/14 0555  NA 134*  --  135  --   K 3.1*  --  3.5  --   CL 100*  --  104  --   CO2 25  --  24  --   GLUCOSE 103*  --  81  --   BUN 8  --  11  --   CREATININE 0.54  --  0.77 0.63  CALCIUM 8.8*  --  8.2*  --   MG  --  1.6*  --   --   AST 35  --   --   --   ALT 19  --   --   --   ALKPHOS 77  --   --   --   BILITOT 0.6  --   --   --    ------------------------------------------------------------------------------------------------------------------  Cardiac Enzymes  Recent Labs Lab 07/07/14 0945  TROPONINI 0.04*   ------------------------------------------------------------------------------------------------------------------  RADIOLOGY:  No results found.   ASSESSMENT AND PLAN:   81 yo female w/ hx of dementia, Hypertension, hyperlipidemia, came into hospital due to a fall and incidentally noted to have pneumonia on CXR.   1. Pneumonia - incidentally noted ton CXR on admission.  - clinically pt. Has no shortness of breath, cough.  Afebrile, WBC count normal.  - cont. Levaquin X 3 more days.   2. S/p Fall - hx of dementia.  - Seen by PT and he recommended short-term rehabilitation. - CM aware and awaiting insurance authorization for SNF placement.   3. Heme + stools - Hg. Stable.  Seen  by GI and no plans on intervention given advanced dementia.  - GI discussed w/ pt's son and he does not want any aggressive intervention.   4. HTN - cont. Benazepril, norvasc. - cont. PRN hydralazine.   5. Dementia - cont. Remeron  6. Hyperlipidemia - cont. Simvastatin  All the records are reviewed and case discussed with Care Management/Social Workerr. Management plans discussed with the patient, family and they are in agreement.  CODE STATUS: Full  DVT Prophylaxis: Lovenox.    TOTAL TIME TAKING CARE OF THIS PATIENT: 25 minutes.   POSSIBLE D/C to short-term rehab on Tuesday 5/31 pending insurance approval.    [name] M.D on 07/11/2014 at 9:21 AM  Between 7am to 6pm - Pager - [phone]  After 6pm go to [email or web address] - password [email or web address] [facility]  Office  [phone]  CC: Primary care physician; Lavera Guise, MD

## 2014-07-11 NOTE — Plan of Care (Signed)
Problem: Discharge Progression Outcomes Goal: Discharge plan in place and appropriate Individualization of Care Individualization of Care:   Patient prefers to be called Tonya Snyder.  PMH: HTN, Hyperlipidemia, Dementia - controlled with medications.  High Fall Risk - toileting assistance with hourly rounding. Bed alarm on. Room near nurses station.     Goal: Other Discharge Outcomes/Goals Outcome: Progressing Plan of care progress to goal:  Oriented to person. Patient continues on room air - sat 100%. Patient remains afebrile. High fall risk - bed alarm on, room near nurses station. ONE person assist to [facility] provided. Levaquin PO will be continued for 3 more days per Dr. Verdell Carmine note. PT recommending short term rehab/SNF placement. SW aware. No complaints of pain.

## 2014-07-12 ENCOUNTER — Observation Stay: Payer: Medicare PPO

## 2014-07-12 DIAGNOSIS — Z981 Arthrodesis status: Secondary | ICD-10-CM | POA: Diagnosis not present

## 2014-07-12 DIAGNOSIS — M25551 Pain in right hip: Secondary | ICD-10-CM | POA: Diagnosis not present

## 2014-07-12 LAB — CULTURE, BLOOD (ROUTINE X 2)
Culture: NO GROWTH
Culture: NO GROWTH

## 2014-07-12 MED ORDER — PNEUMOCOCCAL VAC POLYVALENT 25 MCG/0.5ML IJ INJ: 0.5000 mL | INJECTION | INTRAMUSCULAR | Status: DC

## 2014-07-12 MED ORDER — SODIUM CHLORIDE 0.9 % IJ SOLN
3.0000 mL | Freq: Three times a day (TID) | INTRAMUSCULAR | Status: DC
  Administered 2014-07-12 – 2014-07-13 (×4): 3 mL via INTRAVENOUS

## 2014-07-12 NOTE — Progress Notes (Signed)
[place] at [place] NAME: Tonya Snyder    MR#:  [id number]  DATE OF BIRTH:  [birth year]  SUBJECTIVE:  CHIEF COMPLAINT:   Chief Complaint  Patient presents with  . Fall   Pt. Here s/p fall and incidentally noted to have pneumonia.  No acute events overnight.   REVIEW OF SYSTEMS:    Review of Systems  Unable to perform ROS due to advanced dementia and poor historian.   Nutrition: Regular Tolerating Diet: Yes Tolerating PT: seen by PT and they recommend SNF/STR  DRUG ALLERGIES:  No Known Allergies  VITALS:  Blood pressure 136/56, pulse 76, temperature 97.7 F (36.5 C), temperature source Oral, resp. rate 18, height 5\' 5"  (1.651 m), weight 39.917 kg (88 lb), SpO2 99 %.  PHYSICAL EXAMINATION:   Physical Exam  GENERAL:  81 y.o.-year-old patient lying in bed in no acute distress.  EYES: Pupils equal, round, reactive to light and accommodation. No scleral icterus. Extraocular muscles intact.  HEENT: Head atraumatic, normocephalic. Oropharynx and nasopharynx clear.  NECK:  Supple, no jugular venous distention. No thyroid enlargement, no tenderness.  LUNGS: Normal breath sounds bilaterally, no wheezing, rales, rhonchi. No use of accessory muscles of respiration.  CARDIOVASCULAR: S1, S2 normal. No murmurs, rubs, or gallops.  ABDOMEN: Soft, nontender, nondistended. Bowel sounds present. No organomegaly or mass.  EXTREMITIES: No cyanosis, clubbing or edema b/l.    NEUROLOGIC: Cranial nerves II through XII are intact. No focal Motor or sensory deficits b/l.  Globally weak PSYCHIATRIC: The patient is alert and oriented x 1. Good affect.  SKIN: No obvious rash, lesion, or ulcer.    LABORATORY PANEL:   CBC  Recent Labs Lab 07/08/14 0430  WBC 4.3  HGB 12.1  HCT 34.9*  PLT 166   ------------------------------------------------------------------------------------------------------------------  Chemistries   Recent Labs Lab  07/07/14 0759 07/07/14 2309 07/08/14 0430 07/09/14 0555  NA 134*  --  135  --   K 3.1*  --  3.5  --   CL 100*  --  104  --   CO2 25  --  24  --   GLUCOSE 103*  --  81  --   BUN 8  --  11  --   CREATININE 0.54  --  0.77 0.63  CALCIUM 8.8*  --  8.2*  --   MG  --  1.6*  --   --   AST 35  --   --   --   ALT 19  --   --   --   ALKPHOS 77  --   --   --   BILITOT 0.6  --   --   --    ------------------------------------------------------------------------------------------------------------------  Cardiac Enzymes  Recent Labs Lab 07/07/14 0945  TROPONINI 0.04*   ------------------------------------------------------------------------------------------------------------------  RADIOLOGY:  No results found.   ASSESSMENT AND PLAN:   81 yo female w/ hx of dementia, Hypertension, hyperlipidemia, came into hospital due to a fall and incidentally noted to have pneumonia on CXR.   1. Pneumonia - incidentally noted ton CXR on admission.  - clinically pt. Has had no shortness of breath, cough.  Afebrile, WBC count normal.  - cont. Levaquin X 2 more days.   2. S/p Fall - hx of dementia.  - Seen by PT and he recommended short-term rehabilitation. - CM aware and awaiting insurance authorization for SNF placement.   3. Heme + stools - Hg. Stable.  Seen by GI and no  plans on intervention given advanced dementia.  - GI discussed w/ pt's son and he does not want any aggressive intervention.   4. HTN - cont. Benazepril, norvasc. - cont. PRN hydralazine.   5. Dementia - cont. Remeron  6. Hyperlipidemia - cont. Simvastatin  All the records are reviewed and case discussed with Care Management/Social Workerr. Management plans discussed with the patient, family and they are in agreement.  CODE STATUS: Full  DVT Prophylaxis: Lovenox.   TOTAL TIME TAKING CARE OF THIS PATIENT: 25 minutes.   POSSIBLE D/C to short-term rehab on Tuesday 5/31 pending insurance approval.     [name] M.D on 07/12/2014 at 9:57 AM  Between 7am to 6pm - Pager - [phone]  After 6pm go to [email or web address] - password [email or web address] [facility]  Office  [phone]  CC: Primary care physician; Lavera Guise, MD

## 2014-07-12 NOTE — Plan of Care (Signed)
Problem: Discharge Progression Outcomes Goal: Discharge plan in place and appropriate Individualization of Care Pt is a high fall risk. Offer toileting qx1hr with safety checks. Assist with bsc. H/O HTN, dementia, hyperlipidemia controlled with meds. Goal: Other Discharge Outcomes/Goals O2 in the high 90's on room air. No respiratory distress noted. Afebrile.Calm. Slept well.

## 2014-07-12 NOTE — Plan of Care (Signed)
Problem: Discharge Progression Outcomes Goal: Discharge plan in place and appropriate Individualization of Care Outcome: Progressing Goal: Discharge plan in place and appropriate Individualization of Care Pt is a high fall risk. Offer toileting qx1hr with safety checks. Assist with bsc. H/O HTN, dementia, hyperlipidemia controlled with meds. Goal: Other Discharge Outcomes/Goals Outcome: Progressing Progress to goals: 1. Pulse ox/VSS stable. 2. Reported right hip discomfort with PT today; xray ordered and will FU. Reported comfortable sitting in chair/ up to [facility] with 1+. 3. Tolerating diet well 4. Will continue to increase activity when hip status verified. 5. Family in and SNF placement being requested due to limited cognition with pt unable to stay at home alone at this time.

## 2014-07-12 NOTE — Clinical Social Work Note (Signed)
[employer] auth pending. Sent updated PT notes to [facility]. Asked PT to see pt this afternoon to get an updated note as last note is more than 48 hours old. CSW will continue to follow.   Darden Dates, MSW, LCSW Clinical Social Worker  [phone]

## 2014-07-12 NOTE — Progress Notes (Signed)
Physical Therapy Treatment Patient Details Name: Tonya Snyder MRN: [id number] DOB: 06-05-1920 Today's Date: 07/12/2014    History of Present Illness Fell at home with new R hip pain today, weak and cannot stand on R hip to take a step.  Pt has PNA, heme + stool,     PT Comments    Pt was seen for assessment of gait today, with notable pain in R hip upon standing.  Used RW to compensate for pain which pt was somewhat familiar with due to old R hip fracture.  Her plan is to go to SNF but due to new R hip pain is being x-rayed to ensure her hip joint is not injured from recent fall.  Will see tomorrow if time for gait and ck of assistive devices to see what works, and to ck on imaging results.  Follow Up Recommendations  SNF     Equipment Recommendations  Cane    Recommendations for Other Services       Precautions / Restrictions Precautions Precautions: Fall Restrictions Weight Bearing Restrictions: No    Mobility  Bed Mobility               General bed mobility comments: up when PT entered  Transfers Overall transfer level: Needs assistance Equipment used: Rolling walker (2 wheeled);1 person hand held assist Transfers: Sit to/from [facility] Sit to Stand: Mod assist Stand pivot transfers: Min assist;Mod assist       General transfer comment: assist under UE and on chair with cues, pt is complaining of R hip pain and cannot tolerate standing without help  Ambulation/Gait Ambulation/Gait assistance: Min assist Ambulation Distance (Feet): 300 Feet Assistive device: Rolling walker (2 wheeled);1 person hand held assist Gait Pattern/deviations: Step-to pattern;Step-through pattern;Decreased weight shift to right;Decreased stride length;Antalgic;Wide base of support;Trunk flexed Gait velocity: reduced Gait velocity interpretation: Below normal speed for age/gender General Gait Details: Pt was too unsteady on RLE to attempt gait without RW.  With RW  was slow and careful but with min assist could turn with walker to make trip on unit   Stairs            Wheelchair Mobility    Modified Rankin (Stroke Patients Only)       Balance Overall balance assessment: Needs assistance Sitting-balance support: Feet supported Sitting balance-Leahy Scale: Fair   Postural control: Posterior lean Standing balance support: Bilateral upper extremity supported Standing balance-Leahy Scale: Poor                      Cognition Arousal/Alertness: Awake/alert Behavior During Therapy: WFL for tasks assessed/performed Overall Cognitive Status: History of cognitive impairments - at baseline (confusion)       Memory: Decreased recall of precautions;Decreased short-term memory              Exercises      General Comments General comments (skin integrity, edema, etc.): Pt is up in chair with slow responses to questions and slow to follow commands, but is motivated and tries to walk with PT      Pertinent Vitals/Pain Pain Assessment: Faces Faces Pain Scale: Hurts even more Pain Location: R hip Pain Intervention(s): Limited activity within patient's tolerance;Monitored during session;Repositioned;Other (comment) (spoke to MD and nursing about the pain)    Home Living                      Prior Function  PT Goals (current goals can now be found in the care plan section) Acute Rehab PT Goals Patient Stated Goal: not stated Progress towards PT goals: Progressing toward goals    Frequency  Min 2X/week    PT Plan Current plan remains appropriate    Co-evaluation             End of Session   Activity Tolerance: Patient tolerated treatment well Patient left: in chair;with call bell/phone within reach;with chair alarm set     Time: 1300-1325 PT Time Calculation (min) (ACUTE ONLY): 25 min  Charges:  $Gait Training: 8-22 mins $Therapeutic Exercise: 8-22 mins                    G Codes:       Ramond Dial 2014/07/30, 3:49 PM   Mee Hives, PT MS Acute Rehab Dept. Number: [phone] [phone] and [place] [phone]

## 2014-07-12 NOTE — Progress Notes (Signed)
Nutrition Follow-up  DOCUMENTATION CODES:  Severe malnutrition in context of chronic illness  INTERVENTION:   (Medical Nutrition Supplement: Recommend ensure enlive BID for added nutrition, Meals and snacks: cater to pt prefences)  Will also send Magic Cup BID on trays.  NUTRITION DIAGNOSIS:  Inadequate oral intake related to chronic illness as evidenced by per patient/family report.  GOAL:  Patient will meet greater than or equal to 90% of their needs  MONITOR:   (Energy intake, Electrolyte and renal profile, Anthropometric)  REASON FOR ASSESSMENT:   (Follow-Up)    ASSESSMENT:   Pt sitting in chair on visit, pleasantly confused at times. Per MD note, no current GI interventions given advanced dementia.  PO Intake: Recorded po intake 50% of breakfast this am and on average since admission 58% of meals. Per CNA Brittney some meals pt gets confused and has reported that she needs to keep some food for 'tomorrow.'  Pt reports to Probation officer that she likes the Ensures.  Medications:  Remeron, KCl, Colace Labs: Electrolyte and Renal Profile:    Recent Labs Lab 07/07/14 0759 07/07/14 2309 07/08/14 0430 07/09/14 0555  BUN 8  --  11  --   CREATININE 0.54  --  0.77 0.63  NA 134*  --  135  --   K 3.1*  --  3.5  --   MG  --  1.6*  --   --    Glucose Profile: No results for input(s): GLUCAP in the last 72 hours. Protein Profile:  Recent Labs Lab 07/07/14 0759  ALBUMIN 3.6    Weight trend since admission: [facility] Weights   07/07/14 0722 07/07/14 1610  Weight: 97 lb (43.999 kg) 88 lb (39.917 kg)    BMI:  Body mass index is 14.64 kg/(m^2).  Estimated Nutritional Needs:  Kcal:  Using IBW of 57kg BEE 985 kcals (IF 1.0-1.2, AF 1.3) 1280-1536 kcals/d  Protein:  (1.2-1.5 g/d) 68-86 g/d   Fluid:  (25-57ml/kg) 1425-1773ml/d  Skin:  Reviewed, no issues  Diet Order:  Diet 2 gram sodium Room service appropriate?: Yes; Fluid consistency:: Thin  EDUCATION NEEDS:  No  education needs identified at this time   Intake/Output Summary (Last 24 hours) at 07/12/14 1417 Last data filed at 07/12/14 1300  Gross per 24 hour  Intake    360 ml  Output    120 ml  Net    240 ml    Last BM:  5/30  MODERATE Care Level  Dwyane Luo, RD, LDN Pager [phone]

## 2014-07-13 MED ORDER — LEVOFLOXACIN 500 MG PO TABS: 500.0000 mg | ORAL_TABLET | Freq: Every day | ORAL | Status: DC

## 2014-07-13 NOTE — Plan of Care (Signed)
Problem: Discharge Progression Outcomes Goal: Other Discharge Outcomes/Goals Plan of care progress to goa:  Pt rested well during the night-up to bsc with standby assist Planning discharge to [facility] today

## 2014-07-13 NOTE — Progress Notes (Signed)
   07/13/14 1105  Clinical Encounter Type  Visited With Patient  Visit Type Initial  Spiritual Encounters  Spiritual Needs Emotional  Visited with patient.  Patient was calm, but appeared to be confused as to how she was doing.  I asked patient to let me know if she needed anything.  Patient thanked me for visiting her.  [place] [phone]

## 2014-07-13 NOTE — Progress Notes (Signed)
[facility] NAME: Tonya Snyder    MR#:  [id number]  DATE OF BIRTH:  04/18/1920  SUBJECTIVE:  CHIEF COMPLAINT:   Chief Complaint  Patient presents with  . Fall   Pt. Here s/p fall and incidentally noted to have pneumonia.  No acute events overnight.   REVIEW OF SYSTEMS:    Review of Systems  Constitutional: Negative for fever, weight loss, malaise/fatigue and diaphoresis.  HENT: Negative for ear discharge, ear pain, hearing loss, nosebleeds, sore throat and tinnitus.   Eyes: Negative for blurred vision and pain.  Respiratory: Negative for cough, hemoptysis, shortness of breath and wheezing.   Cardiovascular: Negative for chest pain, palpitations, orthopnea and leg swelling.  Gastrointestinal: Negative for heartburn, nausea, vomiting, abdominal pain, diarrhea, constipation and blood in stool.  Genitourinary: Negative for dysuria, urgency and frequency.  Musculoskeletal: Negative for myalgias and back pain.  Skin: Negative for itching and rash.  Neurological: Negative for dizziness, tingling, tremors, focal weakness, seizures, weakness and headaches.  Psychiatric/Behavioral: Negative for depression. The patient is not nervous/anxious.     Nutrition: Regular Tolerating Diet: Yes Tolerating PT: seen by PT and they recommend SNF/STR  DRUG ALLERGIES:  No Known Allergies  VITALS:  Blood pressure 116/63, pulse 95, temperature 98.7 F (37.1 C), temperature source Oral, resp. rate 18, height 5\' 5"  (1.651 m), weight 39.917 kg (88 lb), SpO2 98 %.  PHYSICAL EXAMINATION:   Physical Exam   GENERAL:  81 y.o.-year-old patient lying in bed in no acute distress.  EYES: Pupils equal, round, reactive to light and accommodation. No scleral icterus. Extraocular muscles intact.  HEENT: Head atraumatic, normocephalic. Oropharynx and nasopharynx clear.  NECK:  Supple, no jugular venous distention. No thyroid enlargement, no tenderness.   LUNGS: Normal breath sounds bilaterally, no wheezing, rales, rhonchi. No use of accessory muscles of respiration.  CARDIOVASCULAR: S1, S2 normal. No murmurs, rubs, or gallops.  ABDOMEN: Soft, nontender, nondistended. Bowel sounds present. No organomegaly or mass.  EXTREMITIES: No cyanosis, clubbing or edema b/l.    NEUROLOGIC: Cranial nerves II through XII are intact. No focal Motor or sensory deficits b/l.  Globally weak PSYCHIATRIC: The patient is alert and oriented x 1. Good affect.  SKIN: No obvious rash, lesion, or ulcer.    LABORATORY PANEL:   CBC  Recent Labs Lab 07/08/14 0430  WBC 4.3  HGB 12.1  HCT 34.9*  PLT 166   ------------------------------------------------------------------------------------------------------------------  Chemistries   Recent Labs Lab 07/07/14 0759 07/07/14 2309 07/08/14 0430 07/09/14 0555  NA 134*  --  135  --   K 3.1*  --  3.5  --   CL 100*  --  104  --   CO2 25  --  24  --   GLUCOSE 103*  --  81  --   BUN 8  --  11  --   CREATININE 0.54  --  0.77 0.63  CALCIUM 8.8*  --  8.2*  --   MG  --  1.6*  --   --   AST 35  --   --   --   ALT 19  --   --   --   ALKPHOS 77  --   --   --   BILITOT 0.6  --   --   --    ------------------------------------------------------------------------------------------------------------------  Cardiac Enzymes  Recent Labs Lab 07/07/14 0945  TROPONINI 0.04*   ------------------------------------------------------------------------------------------------------------------  RADIOLOGY:  Dg Hip Unilat With  Pelvis 2-3 Views Right  07/12/2014   CLINICAL DATA:  Right hip pain, status post right hip replacement  EXAM: RIGHT HIP (WITH PELVIS) 2-3 VIEWS  COMPARISON:  None.  FINDINGS: Status post right hip arthroplasty, without evidence of hardware loosening.  No fracture or dislocation is seen.  Visualized bony pelvis appears intact.  Degenerative changes of the lower lumbar spine.  IMPRESSION: Status post  right hip arthroplasty, without evidence of complication.  No fracture or dislocation is seen.   Electronically Signed   By: Julian Hy M.D.   On: 07/12/2014 18:10     ASSESSMENT AND PLAN:   81 yo female w/ hx of dementia, Hypertension, hyperlipidemia, came into hospital due to a fall and incidentally noted to have pneumonia on CXR.   1. Pneumonia - incidentally noted ton CXR on admission.  - clinically pt. Has had no shortness of breath, cough.  Afebrile, WBC count normal.  - cont. Levaquin X 1 more days.   2. S/p Fall - hx of dementia.  - Seen by PT and he recommended short-term rehabilitation. - CM aware and awaiting insurance authorization for SNF placement.   3. Heme + stools - Hg. Stable.  Seen by GI and no plans on intervention given advanced dementia.  - GI discussed w/ pt's son and he does not want any aggressive intervention.   4. HTN - cont. Benazepril, norvasc. - cont. PRN hydralazine.   5. Dementia - cont. Remeron  6. Hyperlipidemia - cont. Simvastatin  All the records are reviewed and case discussed with Care Management/Social Workerr. Management plans discussed with the patient, family and they are in agreement.  CODE STATUS: Full  DVT Prophylaxis: Lovenox.   TOTAL TIME TAKING CARE OF THIS PATIENT: 25 minutes.   PLAN WAS TO D/C to short-term rehab on Today on 5/31 but per SW @ 5:30 still no insurance approval.    Max Sane M.D on 07/13/2014 at 7:29 PM  Between 7am to 6pm - Pager - [phone]  After 6pm go to [email or web address] - password [email or web address] [facility]  Office  [phone]  CC: Primary care physician; Lavera Guise, MD

## 2014-07-14 DIAGNOSIS — M25551 Pain in right hip: Secondary | ICD-10-CM | POA: Diagnosis not present

## 2014-07-14 DIAGNOSIS — E43 Unspecified severe protein-calorie malnutrition: Secondary | ICD-10-CM | POA: Diagnosis not present

## 2014-07-14 DIAGNOSIS — R296 Repeated falls: Secondary | ICD-10-CM | POA: Diagnosis not present

## 2014-07-14 DIAGNOSIS — Z681 Body mass index (BMI) 19 or less, adult: Secondary | ICD-10-CM | POA: Diagnosis not present

## 2014-07-14 DIAGNOSIS — Z96641 Presence of right artificial hip joint: Secondary | ICD-10-CM | POA: Diagnosis not present

## 2014-07-14 DIAGNOSIS — R195 Other fecal abnormalities: Secondary | ICD-10-CM | POA: Diagnosis not present

## 2014-07-14 DIAGNOSIS — E876 Hypokalemia: Secondary | ICD-10-CM | POA: Diagnosis not present

## 2014-07-14 DIAGNOSIS — Z9181 History of falling: Secondary | ICD-10-CM | POA: Diagnosis not present

## 2014-07-14 DIAGNOSIS — B370 Candidal stomatitis: Secondary | ICD-10-CM | POA: Diagnosis not present

## 2014-07-14 DIAGNOSIS — E785 Hyperlipidemia, unspecified: Secondary | ICD-10-CM | POA: Diagnosis not present

## 2014-07-14 DIAGNOSIS — F329 Major depressive disorder, single episode, unspecified: Secondary | ICD-10-CM | POA: Diagnosis not present

## 2014-07-14 DIAGNOSIS — I10 Essential (primary) hypertension: Secondary | ICD-10-CM | POA: Diagnosis not present

## 2014-07-14 DIAGNOSIS — J189 Pneumonia, unspecified organism: Secondary | ICD-10-CM | POA: Diagnosis not present

## 2014-07-14 DIAGNOSIS — R531 Weakness: Secondary | ICD-10-CM | POA: Diagnosis not present

## 2014-07-14 LAB — CREATININE, SERUM
Creatinine, Ser: 0.59 mg/dL (ref 0.44–1.00)
GFR calc Af Amer: >60 mL/min (ref >60)
GFR calc non Af Amer: >60 mL/min (ref >60)

## 2014-07-14 NOTE — Plan of Care (Signed)
Problem: Discharge Progression Outcomes Goal: Discharge plan in place and appropriate Individualization of Care Pt is a high fall risk. Offer toileting qx1hr with safety checks. Assist with bsc. H/O HTN, dementia, hyperlipidemia controlled with meds. Goal: Other Discharge Outcomes/Goals Plan of care progress to goal for: Pneumonia - Continues on PO ABX. - No complaints of pain. - Assist to [facility]. - Possible d/c today.

## 2014-07-14 NOTE — Progress Notes (Signed)
CSW was notified by [facility] that they have received authorization from [facility] for SNF. MD states that Pt is medically cleared for dc to SNF. CSW sent updated dc summary to [facility]. Pt's granddaughter will transport Pt at dc.    No further CSW needs at this time.   Toma Copier, [place]

## 2014-07-14 NOTE — Clinical Social Work Placement (Signed)
   CLINICAL SOCIAL WORK PLACEMENT  NOTE  Date:  07/14/2014  Patient Details  Name: Tonya Snyder MRN: [id number] Date of Birth: February 27, 1920  Clinical Social Work is seeking post-discharge placement for this patient at the [place] level of care (*CSW will initial, date and re-position this form in  chart as items are completed):  Yes   Patient/family provided with [place] Work Department's list of facilities offering this level of care within the geographic area requested by the patient (or if unable, by the patient's family).  Yes   Patient/family informed of their freedom to choose among providers that offer the needed level of care, that participate in Medicare, Medicaid or managed care program needed by the patient, have an available bed and are willing to accept the patient.  Yes   Patient/family informed of [facility]'s ownership interest in [facility] and [facility], as well as of the fact that they are under no obligation to receive care at these facilities.  PASRR submitted to EDS on       PASRR number received on       Existing PASRR number confirmed on 07/08/14     FL2 transmitted to all facilities in geographic area requested by pt/family on 07/08/14     FL2 transmitted to all facilities within larger geographic area on       Patient informed that his/her managed care company has contracts with or will negotiate with certain facilities, including the following:        Yes   Patient/family informed of bed offers received.  Patient chooses bed at [facility]     Physician recommends and patient chooses bed at      Patient to be transferred to [facility] on 07/14/14.  Patient to be transferred to facility by granddaughter     Patient family notified on 07/14/14 of transfer.  Name of family member notified:  granddaughter     PHYSICIAN       Additional Comment:     _______________________________________________ Alonna Buckler, LCSW 07/14/2014, 1:18 PM

## 2014-07-14 NOTE — Progress Notes (Signed)
Physical Therapy Treatment Patient Details Name: Tonya Snyder MRN: [id number] DOB: 08/15/1920 Today's Date: 07/14/2014    History of Present Illness Fell at home with new R hip pain today, weak and cannot stand on R hip to take a step.  Pt has PNA, heme + stool,     PT Comments    Pt is making good progress towards goals with good endurance noted during there-ex this date. Pt still requires assist for ambulation secondary to balance deficits and cues for safe technique.  Follow Up Recommendations  SNF     Equipment Recommendations       Recommendations for Other Services       Precautions / Restrictions Precautions Precautions: Fall Restrictions Weight Bearing Restrictions: No    Mobility  Bed Mobility Overal bed mobility: Needs Assistance Bed Mobility: Supine to Sit     Supine to sit: Min assist     General bed mobility comments: supine->sit with min assist including assist for scooting towards EOB.  Transfers Overall transfer level: Needs assistance Equipment used: Rolling walker (2 wheeled) Transfers: Sit to/from Stand Sit to Stand: Min assist         General transfer comment: cues for safe technique and cues for upright posture. RW used for transfer  Ambulation/Gait Ambulation/Gait assistance: Min Wellsite geologist (Feet): 80 Feet Assistive device: Rolling walker (2 wheeled) Gait Pattern/deviations: Step-to pattern     General Gait Details: ambulation performed with min assist with cues for increased B foot width to improve balance. Pt with forward flexed posture.   Stairs            Wheelchair Mobility    Modified Rankin (Stroke Patients Only)       Balance                                    Cognition Arousal/Alertness: Awake/alert Behavior During Therapy: WFL for tasks assessed/performed Overall Cognitive Status: History of cognitive impairments - at baseline                      Exercises Other  Exercises Other Exercises: Pt performed B LE ther-ex in recliner including hip abd/add, SLRs, SAQ, hip add squeezes, and knee flexion. All therex performed x 10 reps with cues for sequencing and cga for correct technique.    General Comments        Pertinent Vitals/Pain Pain Assessment: No/denies pain    Home Living                      Prior Function            PT Goals (current goals can now be found in the care plan section) Acute Rehab PT Goals Patient Stated Goal: not stated PT Goal Formulation: With patient Time For Goal Achievement: 07/22/14 Potential to Achieve Goals: Good Progress towards PT goals: Progressing toward goals    Frequency  Min 2X/week    PT Plan Current plan remains appropriate    Co-evaluation             End of Session Equipment Utilized During Treatment: Gait belt Activity Tolerance: Patient tolerated treatment well Patient left: in chair;with call bell/phone within reach;with chair alarm set     Time: 9562-1308 PT Time Calculation (min) (ACUTE ONLY): 18 min  Charges:  $Gait Training: 8-22 mins  G Codes:      Elbert Polyakov 07/14/2014, 10:19 AM  Greggory Stallion, PT, DPT [phone]

## 2014-07-14 NOTE — Clinical Social Work Note (Signed)
Tonya Snyder is still pending. CSW spoke with admissions coordinator at [facility] regarding [place]. CSW attempted to reach [facility] to assist in expediting process. CSW updated MD, RN, and pt's granddaughter. CSW will continue to follow.   Darden Dates, MSW, LCSW  Clinical Social Worker [phone]

## 2014-07-16 DIAGNOSIS — B370 Candidal stomatitis: Secondary | ICD-10-CM | POA: Diagnosis not present

## 2014-08-10 ENCOUNTER — Ambulatory Visit
Admission: RE | Admit: 2014-08-10 | Discharge: 2014-08-10 | Disposition: A | Discharge status: Home / Self Care | Payer: Medicare PPO | Source: Ambulatory Visit | Attending: Nurse Practitioner | Admitting: Nurse Practitioner

## 2014-08-10 ENCOUNTER — Other Ambulatory Visit: Payer: Self-pay | Admitting: Nurse Practitioner

## 2014-08-10 DIAGNOSIS — M25551 Pain in right hip: Secondary | ICD-10-CM | POA: Diagnosis not present

## 2014-08-10 DIAGNOSIS — L309 Dermatitis, unspecified: Secondary | ICD-10-CM | POA: Diagnosis not present

## 2014-08-10 DIAGNOSIS — R262 Difficulty in walking, not elsewhere classified: Secondary | ICD-10-CM | POA: Diagnosis not present

## 2014-08-10 DIAGNOSIS — S79911A Unspecified injury of right hip, initial encounter: Secondary | ICD-10-CM | POA: Diagnosis not present

## 2014-08-10 DIAGNOSIS — S3993XA Unspecified injury of pelvis, initial encounter: Secondary | ICD-10-CM | POA: Diagnosis not present

## 2014-08-10 DIAGNOSIS — W19XXXD Unspecified fall, subsequent encounter: Secondary | ICD-10-CM | POA: Diagnosis not present

## 2014-08-10 DIAGNOSIS — N393 Stress incontinence (female) (male): Secondary | ICD-10-CM | POA: Diagnosis not present

## 2014-08-10 DIAGNOSIS — Z96641 Presence of right artificial hip joint: Secondary | ICD-10-CM | POA: Insufficient documentation

## 2014-08-10 DIAGNOSIS — M8580 Other specified disorders of bone density and structure, unspecified site: Secondary | ICD-10-CM | POA: Insufficient documentation

## 2014-08-10 DIAGNOSIS — I10 Essential (primary) hypertension: Secondary | ICD-10-CM | POA: Diagnosis not present

## 2014-08-23 DIAGNOSIS — Z96641 Presence of right artificial hip joint: Secondary | ICD-10-CM | POA: Diagnosis not present

## 2014-08-23 DIAGNOSIS — M25551 Pain in right hip: Secondary | ICD-10-CM | POA: Diagnosis not present

## 2014-09-01 DIAGNOSIS — D649 Anemia, unspecified: Secondary | ICD-10-CM | POA: Diagnosis not present

## 2014-09-01 DIAGNOSIS — F0280 Dementia in other diseases classified elsewhere without behavioral disturbance: Secondary | ICD-10-CM | POA: Diagnosis not present

## 2014-09-01 DIAGNOSIS — I10 Essential (primary) hypertension: Secondary | ICD-10-CM | POA: Diagnosis not present

## 2014-09-01 DIAGNOSIS — G309 Alzheimer's disease, unspecified: Secondary | ICD-10-CM | POA: Diagnosis not present

## 2014-09-02 LAB — LEGIONELLA ANTIGEN, URINE

## 2014-09-13 DIAGNOSIS — M25551 Pain in right hip: Secondary | ICD-10-CM | POA: Diagnosis not present

## 2014-09-13 DIAGNOSIS — E860 Dehydration: Secondary | ICD-10-CM | POA: Diagnosis not present

## 2014-09-13 DIAGNOSIS — F0280 Dementia in other diseases classified elsewhere without behavioral disturbance: Secondary | ICD-10-CM | POA: Diagnosis not present

## 2014-09-13 DIAGNOSIS — W19XXXD Unspecified fall, subsequent encounter: Secondary | ICD-10-CM | POA: Diagnosis not present

## 2014-09-13 DIAGNOSIS — I10 Essential (primary) hypertension: Secondary | ICD-10-CM | POA: Diagnosis not present

## 2014-11-01 DIAGNOSIS — I739 Peripheral vascular disease, unspecified: Secondary | ICD-10-CM | POA: Diagnosis not present

## 2014-11-01 DIAGNOSIS — R2689 Other abnormalities of gait and mobility: Secondary | ICD-10-CM | POA: Diagnosis not present

## 2014-11-01 DIAGNOSIS — G309 Alzheimer's disease, unspecified: Secondary | ICD-10-CM | POA: Diagnosis not present

## 2014-11-01 DIAGNOSIS — F0280 Dementia in other diseases classified elsewhere without behavioral disturbance: Secondary | ICD-10-CM | POA: Diagnosis not present

## 2014-11-01 DIAGNOSIS — R269 Unspecified abnormalities of gait and mobility: Secondary | ICD-10-CM | POA: Diagnosis not present

## 2014-11-01 DIAGNOSIS — M6281 Muscle weakness (generalized): Secondary | ICD-10-CM | POA: Diagnosis not present

## 2014-11-08 DIAGNOSIS — R269 Unspecified abnormalities of gait and mobility: Secondary | ICD-10-CM | POA: Diagnosis not present

## 2014-11-08 DIAGNOSIS — G309 Alzheimer's disease, unspecified: Secondary | ICD-10-CM | POA: Diagnosis not present

## 2014-11-08 DIAGNOSIS — F0280 Dementia in other diseases classified elsewhere without behavioral disturbance: Secondary | ICD-10-CM | POA: Diagnosis not present

## 2014-11-08 DIAGNOSIS — R2689 Other abnormalities of gait and mobility: Secondary | ICD-10-CM | POA: Diagnosis not present

## 2014-11-08 DIAGNOSIS — I739 Peripheral vascular disease, unspecified: Secondary | ICD-10-CM | POA: Diagnosis not present

## 2014-11-08 DIAGNOSIS — M6281 Muscle weakness (generalized): Secondary | ICD-10-CM | POA: Diagnosis not present

## 2014-11-15 DIAGNOSIS — M25551 Pain in right hip: Secondary | ICD-10-CM | POA: Diagnosis not present

## 2014-11-15 DIAGNOSIS — R262 Difficulty in walking, not elsewhere classified: Secondary | ICD-10-CM | POA: Diagnosis not present

## 2014-11-15 DIAGNOSIS — N390 Urinary tract infection, site not specified: Secondary | ICD-10-CM | POA: Diagnosis not present

## 2014-11-15 DIAGNOSIS — I10 Essential (primary) hypertension: Secondary | ICD-10-CM | POA: Diagnosis not present

## 2014-11-16 DIAGNOSIS — M6281 Muscle weakness (generalized): Secondary | ICD-10-CM | POA: Diagnosis not present

## 2014-11-16 DIAGNOSIS — F0280 Dementia in other diseases classified elsewhere without behavioral disturbance: Secondary | ICD-10-CM | POA: Diagnosis not present

## 2014-11-16 DIAGNOSIS — G309 Alzheimer's disease, unspecified: Secondary | ICD-10-CM | POA: Diagnosis not present

## 2014-11-16 DIAGNOSIS — I739 Peripheral vascular disease, unspecified: Secondary | ICD-10-CM | POA: Diagnosis not present

## 2014-11-16 DIAGNOSIS — R269 Unspecified abnormalities of gait and mobility: Secondary | ICD-10-CM | POA: Diagnosis not present

## 2014-11-16 DIAGNOSIS — R2689 Other abnormalities of gait and mobility: Secondary | ICD-10-CM | POA: Diagnosis not present

## 2014-11-22 DIAGNOSIS — M6281 Muscle weakness (generalized): Secondary | ICD-10-CM | POA: Diagnosis not present

## 2014-11-22 DIAGNOSIS — R2689 Other abnormalities of gait and mobility: Secondary | ICD-10-CM | POA: Diagnosis not present

## 2014-11-22 DIAGNOSIS — R269 Unspecified abnormalities of gait and mobility: Secondary | ICD-10-CM | POA: Diagnosis not present

## 2014-11-22 DIAGNOSIS — I739 Peripheral vascular disease, unspecified: Secondary | ICD-10-CM | POA: Diagnosis not present

## 2014-11-22 DIAGNOSIS — G309 Alzheimer's disease, unspecified: Secondary | ICD-10-CM | POA: Diagnosis not present

## 2014-11-22 DIAGNOSIS — F0280 Dementia in other diseases classified elsewhere without behavioral disturbance: Secondary | ICD-10-CM | POA: Diagnosis not present

## 2014-11-29 DIAGNOSIS — I739 Peripheral vascular disease, unspecified: Secondary | ICD-10-CM | POA: Diagnosis not present

## 2014-11-29 DIAGNOSIS — F0280 Dementia in other diseases classified elsewhere without behavioral disturbance: Secondary | ICD-10-CM | POA: Diagnosis not present

## 2014-11-29 DIAGNOSIS — G309 Alzheimer's disease, unspecified: Secondary | ICD-10-CM | POA: Diagnosis not present

## 2014-11-29 DIAGNOSIS — R2689 Other abnormalities of gait and mobility: Secondary | ICD-10-CM | POA: Diagnosis not present

## 2014-11-29 DIAGNOSIS — R269 Unspecified abnormalities of gait and mobility: Secondary | ICD-10-CM | POA: Diagnosis not present

## 2014-11-29 DIAGNOSIS — M6281 Muscle weakness (generalized): Secondary | ICD-10-CM | POA: Diagnosis not present

## 2014-12-06 DIAGNOSIS — F0280 Dementia in other diseases classified elsewhere without behavioral disturbance: Secondary | ICD-10-CM | POA: Diagnosis not present

## 2014-12-06 DIAGNOSIS — I739 Peripheral vascular disease, unspecified: Secondary | ICD-10-CM | POA: Diagnosis not present

## 2014-12-06 DIAGNOSIS — R2689 Other abnormalities of gait and mobility: Secondary | ICD-10-CM | POA: Diagnosis not present

## 2014-12-06 DIAGNOSIS — M6281 Muscle weakness (generalized): Secondary | ICD-10-CM | POA: Diagnosis not present

## 2014-12-06 DIAGNOSIS — R269 Unspecified abnormalities of gait and mobility: Secondary | ICD-10-CM | POA: Diagnosis not present

## 2014-12-06 DIAGNOSIS — G309 Alzheimer's disease, unspecified: Secondary | ICD-10-CM | POA: Diagnosis not present

## 2014-12-21 DIAGNOSIS — M6281 Muscle weakness (generalized): Secondary | ICD-10-CM | POA: Diagnosis not present

## 2014-12-21 DIAGNOSIS — F0280 Dementia in other diseases classified elsewhere without behavioral disturbance: Secondary | ICD-10-CM | POA: Diagnosis not present

## 2014-12-21 DIAGNOSIS — R2689 Other abnormalities of gait and mobility: Secondary | ICD-10-CM | POA: Diagnosis not present

## 2014-12-21 DIAGNOSIS — I10 Essential (primary) hypertension: Secondary | ICD-10-CM | POA: Diagnosis not present

## 2014-12-21 DIAGNOSIS — I739 Peripheral vascular disease, unspecified: Secondary | ICD-10-CM | POA: Diagnosis not present

## 2014-12-21 DIAGNOSIS — D509 Iron deficiency anemia, unspecified: Secondary | ICD-10-CM | POA: Diagnosis not present

## 2014-12-21 DIAGNOSIS — G309 Alzheimer's disease, unspecified: Secondary | ICD-10-CM | POA: Diagnosis not present

## 2014-12-21 DIAGNOSIS — R269 Unspecified abnormalities of gait and mobility: Secondary | ICD-10-CM | POA: Diagnosis not present

## 2015-03-03 DIAGNOSIS — F0390 Unspecified dementia without behavioral disturbance: Secondary | ICD-10-CM | POA: Diagnosis not present

## 2015-03-03 DIAGNOSIS — Z022 Encounter for examination for admission to residential institution: Secondary | ICD-10-CM | POA: Diagnosis not present

## 2015-03-03 DIAGNOSIS — I10 Essential (primary) hypertension: Secondary | ICD-10-CM | POA: Diagnosis not present

## 2015-03-03 DIAGNOSIS — R262 Difficulty in walking, not elsewhere classified: Secondary | ICD-10-CM | POA: Diagnosis not present

## 2015-04-18 ENCOUNTER — Emergency Department
Admission: EM | Admit: 2015-04-18 | Discharge: 2015-04-18 | Disposition: A | Discharge status: Home / Self Care | Payer: Medicare PPO | Attending: Emergency Medicine | Admitting: Emergency Medicine

## 2015-04-18 DIAGNOSIS — Z7982 Long term (current) use of aspirin: Secondary | ICD-10-CM | POA: Insufficient documentation

## 2015-04-18 DIAGNOSIS — Z79899 Other long term (current) drug therapy: Secondary | ICD-10-CM | POA: Insufficient documentation

## 2015-04-18 DIAGNOSIS — R319 Hematuria, unspecified: Secondary | ICD-10-CM | POA: Diagnosis not present

## 2015-04-18 DIAGNOSIS — N289 Disorder of kidney and ureter, unspecified: Secondary | ICD-10-CM | POA: Insufficient documentation

## 2015-04-18 DIAGNOSIS — I10 Essential (primary) hypertension: Secondary | ICD-10-CM | POA: Insufficient documentation

## 2015-04-18 DIAGNOSIS — N390 Urinary tract infection, site not specified: Secondary | ICD-10-CM

## 2015-04-18 DIAGNOSIS — Z87891 Personal history of nicotine dependence: Secondary | ICD-10-CM | POA: Insufficient documentation

## 2015-04-18 DIAGNOSIS — R300 Dysuria: Secondary | ICD-10-CM | POA: Diagnosis present

## 2015-04-18 LAB — URINALYSIS COMPLETE WITH MICROSCOPIC (ARMC ONLY)
BILIRUBIN URINE: NEGATIVE
Glucose, UA: NEGATIVE mg/dL
KETONES UR: NEGATIVE mg/dL
Nitrite: NEGATIVE
Protein, ur: 100 mg/dL — AB
SPECIFIC GRAVITY, URINE: 1.015 (ref 1.005–1.030)
TRANS EPITHEL UA: 3
pH: 7 (ref 5.0–8.0)

## 2015-04-18 LAB — COMPREHENSIVE METABOLIC PANEL
ALK PHOS: 64 U/L (ref 38–126)
ALT: 16 U/L (ref 14–54)
AST: 25 U/L (ref 15–41)
Albumin: 3.5 g/dL (ref 3.5–5.0)
Anion gap: 10 (ref 5–15)
BUN: 33 mg/dL — ABNORMAL HIGH (ref 6–20)
CHLORIDE: 104 mmol/L (ref 101–111)
CO2: 25 mmol/L (ref 22–32)
Calcium: 9.1 mg/dL (ref 8.9–10.3)
Creatinine, Ser: 1.09 mg/dL — ABNORMAL HIGH (ref 0.44–1.00)
GFR calc non Af Amer: 43 mL/min — ABNORMAL LOW (ref >60)
GFR, EST AFRICAN AMERICAN: 49 mL/min — AB (ref >60)
Glucose, Bld: 77 mg/dL (ref 65–99)
Potassium: 3.5 mmol/L (ref 3.5–5.1)
SODIUM: 139 mmol/L (ref 135–145)
Total Bilirubin: 0.5 mg/dL (ref 0.3–1.2)
Total Protein: 7.3 g/dL (ref 6.5–8.1)

## 2015-04-18 LAB — CBC
HCT: 37.1 % (ref 35.0–47.0)
HEMOGLOBIN: 12.6 g/dL (ref 12.0–16.0)
MCH: 29.6 pg (ref 26.0–34.0)
MCHC: 33.9 g/dL (ref 32.0–36.0)
MCV: 87.4 fL (ref 80.0–100.0)
Platelets: 188 10*3/uL (ref 150–440)
RBC: 4.25 MIL/uL (ref 3.80–5.20)
RDW: 15 % — ABNORMAL HIGH (ref 11.5–14.5)
WBC: 5.5 10*3/uL (ref 3.6–11.0)

## 2015-04-18 LAB — LIPASE, BLOOD: LIPASE: 14 U/L (ref 11–51)

## 2015-04-18 MED ORDER — CEPHALEXIN 500 MG PO CAPS: 500.0000 mg | ORAL_CAPSULE | Freq: Four times a day (QID) | ORAL | Status: AC

## 2015-04-18 MED ORDER — SODIUM CHLORIDE 0.9 % IV BOLUS (SEPSIS)
1000.0000 mL | Freq: Once | INTRAVENOUS | Status: AC
  Administered 2015-04-18: 1000 mL via INTRAVENOUS

## 2015-04-18 MED ORDER — DEXTROSE 5 % IV SOLN
1.0000 g | Freq: Once | INTRAVENOUS | Status: AC
  Administered 2015-04-18: 1 g via INTRAVENOUS
  Filled 2015-04-18: qty 10

## 2015-04-18 NOTE — ED Notes (Signed)
Pt and family reports 4 days of lower abdominal swelling. Pt reports dysuria and urinary retention.

## 2015-04-18 NOTE — ED Notes (Signed)
Assisted patient ambulation to BR.  Patient tolerated well.Marland Kitchen

## 2015-04-18 NOTE — Discharge Instructions (Signed)
Please take the entire course of antibiotics, even if you're feeling better. Please make a follow-up appointment your primary care physician for reevaluation and to have her kidney function rechecked, as it was slightly abnormal today.  Drink plenty of fluids to stay well hydrated and keep your kidneys working well.  Return to the emergency department if you develop severe pain, fever, vomiting, or any other symptoms concerning to you.

## 2015-04-18 NOTE — ED Notes (Signed)
AAOx3.  Skin warm and dry.  NAD 

## 2015-04-18 NOTE — ED Provider Notes (Signed)
[facility] Emergency Department Provider Note  ____________________________________________  Time seen: Approximately 1:07 PM  I have reviewed the triage vital signs and the nursing notes.   HISTORY  Chief Complaint Abdominal Pain    HPI Tonya Snyder is a 82 y.o. female presenting with dysuria and a sensation of incomplete voiding. Patient reports that for the last several days she has been having some very mild burning with urination. She has also felt like when she urinates, that she still needs to go but is unable to produce more urine. She reports suprapubic fullness. No fever, chills, nausea or vomiting, diarrhea. Flank pain.   Past Medical History  Diagnosis Date  . Hypertension   . Dementia   . Hyperlipidemia     Patient Active Problem List   Diagnosis Date Noted  . Protein-calorie malnutrition, severe ([place]) 07/09/2014  . Pneumonia 07/07/2014  . Fall 07/07/2014  . Hypokalemia 07/07/2014  . Lipoma of arm 10/14/2013    Past Surgical History  Procedure Laterality Date  . Abdominal hysterectomy    . Hip surgery      Current Outpatient Rx  Name  Route  Sig  Dispense  Refill  . amLODipine (NORVASC) 5 MG tablet   Oral   Take 1 tablet by mouth daily.         Marland Kitchen aspirin EC 81 MG tablet   Oral   Take 81 mg by mouth daily.         . benazepril (LOTENSIN) 10 MG tablet   Oral   Take 1 tablet by mouth daily.         Marland Kitchen docusate sodium (COLACE) 100 MG capsule   Oral   Take 100 mg by mouth at bedtime.         . hydrochlorothiazide (HYDRODIURIL) 25 MG tablet   Oral   Take 1 tablet by mouth daily.         . hydrocortisone cream 1 %   Topical   Apply 1 application topically 4 (four) times daily.         . mirtazapine (REMERON) 15 MG tablet   Oral   Take 0.5 tablets by mouth at bedtime.          . Multiple Vitamins-Minerals (MULTIVITAMIN PO)   Oral   Take 1 tablet by mouth daily.         . potassium chloride  (K-DUR,KLOR-CON) 10 MEQ tablet   Oral   Take 1 tablet by mouth daily.         . simvastatin (ZOCOR) 20 MG tablet   Oral   Take 1 tablet by mouth at bedtime.            Allergies Review of patient's allergies indicates no known allergies.  No family history on file.  Social History Social History  Substance Use Topics  . Smoking status: Former Research scientist (life sciences)  . Smokeless tobacco: Current User    Types: Snuff  . Alcohol Use: No    Review of Systems Constitutional: No fever/chills. No lightheadedness or syncope. Eyes: No visual changes. ENT: No sore throat. Cardiovascular: Denies chest pain, palpitations. Respiratory: Denies shortness of breath.  No cough. Gastrointestinal: No abdominal pain but sensation of suprapubic fullness..  No nausea, no vomiting.  No diarrhea.  No constipation. Genitourinary: Positive for dysuria. Positive for sensation of incomplete voiding. Musculoskeletal: Negative for back pain. No flank pain. Skin: Negative for rash. Neurological: Negative for headaches, focal weakness or numbness.  10-point ROS otherwise negative.  ____________________________________________   PHYSICAL EXAM:  VITAL SIGNS: ED Triage Vitals  Enc Vitals Group     BP 04/18/15 0937 121/89 mmHg     Pulse Rate 04/18/15 0937 97     Resp 04/18/15 0937 18     Temp 04/18/15 0937 98.3 F (36.8 C)     Temp Source 04/18/15 0937 Oral     SpO2 04/18/15 0937 97 %     Weight 04/18/15 0937 100 lb (45.36 kg)     Height --      Head Cir --      Peak Flow --      Pain Score 04/18/15 0941 0     Pain Loc --      Pain Edu? --      Excl. in [place]? --     Constitutional: Alert and oriented. Well appearing and in no acute distress. Answer question appropriately. Eyes: Conjunctivae are normal.  EOMI. Head: Atraumatic. Nose: No congestion/rhinnorhea. Mouth/Throat: Mucous membranes are moist.  Neck: No stridor.  Supple.   Cardiovascular: Normal rate, regular rhythm. No murmurs, rubs or  gallops.  Respiratory: Normal respiratory effort.  No retractions. Lungs CTAB.  No wheezes, rales or ronchi. Gastrointestinal: Abdomen is soft, nondistended and nontender. No guarding or rebound. No peritoneal signs.. Musculoskeletal: No LE edema.  Neurologic:  Normal speech and language. No gross focal neurologic deficits are appreciated.  Skin:  Skin is warm, dry and intact. No rash noted. Psychiatric: Mood and affect are normal. Speech and behavior are normal.  Normal judgement.  ____________________________________________   LABS (all labs ordered are listed, but only abnormal results are displayed)  Labs Reviewed  COMPREHENSIVE METABOLIC PANEL - Abnormal; Notable for the following:    BUN 33 (*)    Creatinine, Ser 1.09 (*)    GFR calc non Af Amer 43 (*)    GFR calc Af Amer 49 (*)    All other components within normal limits  CBC - Abnormal; Notable for the following:    RDW 15.0 (*)    All other components within normal limits  URINALYSIS COMPLETEWITH MICROSCOPIC ([facility] ONLY) - Abnormal; Notable for the following:    Color, Urine RED (*)    APPearance CLOUDY (*)    Hgb urine dipstick 3+ (*)    Protein, ur 100 (*)    Leukocytes, UA 2+ (*)    Bacteria, UA RARE (*)    Squamous Epithelial / LPF 0-5 (*)    All other components within normal limits  LIPASE, BLOOD   ____________________________________________  EKG  Not indicated ____________________________________________  RADIOLOGY  No results found.  ____________________________________________   PROCEDURES  Procedure(s) performed: None  Critical Care performed: No ____________________________________________   INITIAL IMPRESSION / ASSESSMENT AND PLAN / ED COURSE  Pertinent labs & imaging results that were available during my care of the patient were reviewed by me and considered in my medical decision making (see chart for details).  82 y.o. female with dysuria and the sensation of incomplete voiding.  The patient is afebrile and otherwise has stable vital signs. Her physical exam is reassuring. We performed a postvoid residual which yielded very little urine, however, the patient did have visible hematuria. The patient has some minimal renal insufficiency with a baseline creatinine of 0.5 and today slightly greater than 1. His is likely due to dehydration and I will give her fluids for that. She does have a urinary tract infection and we will give her first dose of Rocephin in the emergency  department, then discharge her home with Keflex. She will need to follow up with her primary care physician both for reevaluation of her U TIA and her renal insufficiency. Plan discharge.  ____________________________________________  FINAL CLINICAL IMPRESSION(S) / ED DIAGNOSES  Final diagnoses:  UTI (lower urinary tract infection)  Acute renal insufficiency  Hematuria      NEW MEDICATIONS STARTED DURING THIS VISIT:  New Prescriptions   No medications on file     Eula Listen, MD 04/18/15 1311

## 2015-05-05 DIAGNOSIS — M25551 Pain in right hip: Secondary | ICD-10-CM | POA: Diagnosis not present

## 2015-05-05 DIAGNOSIS — F0280 Dementia in other diseases classified elsewhere without behavioral disturbance: Secondary | ICD-10-CM | POA: Diagnosis not present

## 2015-05-05 DIAGNOSIS — K5900 Constipation, unspecified: Secondary | ICD-10-CM | POA: Diagnosis not present

## 2015-05-05 DIAGNOSIS — R1030 Lower abdominal pain, unspecified: Secondary | ICD-10-CM | POA: Diagnosis not present

## 2015-05-05 DIAGNOSIS — I10 Essential (primary) hypertension: Secondary | ICD-10-CM | POA: Diagnosis not present

## 2015-05-05 DIAGNOSIS — N390 Urinary tract infection, site not specified: Secondary | ICD-10-CM | POA: Diagnosis not present

## 2015-05-09 ENCOUNTER — Other Ambulatory Visit: Payer: Self-pay | Admitting: Internal Medicine

## 2015-05-09 DIAGNOSIS — R102 Pelvic and perineal pain: Secondary | ICD-10-CM

## 2015-05-09 DIAGNOSIS — R1030 Lower abdominal pain, unspecified: Secondary | ICD-10-CM

## 2015-05-13 ENCOUNTER — Ambulatory Visit: Payer: Medicare PPO

## 2015-05-13 ENCOUNTER — Ambulatory Visit
Admission: RE | Admit: 2015-05-13 | Discharge: 2015-05-13 | Disposition: A | Discharge status: Home / Self Care | Payer: Medicare PPO | Source: Ambulatory Visit | Attending: Internal Medicine | Admitting: Internal Medicine

## 2015-05-13 DIAGNOSIS — N281 Cyst of kidney, acquired: Secondary | ICD-10-CM | POA: Insufficient documentation

## 2015-05-13 DIAGNOSIS — R102 Pelvic and perineal pain: Secondary | ICD-10-CM | POA: Insufficient documentation

## 2015-05-13 DIAGNOSIS — R1030 Lower abdominal pain, unspecified: Secondary | ICD-10-CM | POA: Diagnosis not present

## 2015-05-13 DIAGNOSIS — R109 Unspecified abdominal pain: Secondary | ICD-10-CM | POA: Diagnosis not present

## 2015-06-07 DIAGNOSIS — I10 Essential (primary) hypertension: Secondary | ICD-10-CM | POA: Diagnosis not present

## 2015-06-07 DIAGNOSIS — Z0001 Encounter for general adult medical examination with abnormal findings: Secondary | ICD-10-CM | POA: Diagnosis not present

## 2015-06-07 DIAGNOSIS — R1030 Lower abdominal pain, unspecified: Secondary | ICD-10-CM | POA: Diagnosis not present

## 2015-06-07 DIAGNOSIS — E860 Dehydration: Secondary | ICD-10-CM | POA: Diagnosis not present

## 2015-06-07 DIAGNOSIS — N390 Urinary tract infection, site not specified: Secondary | ICD-10-CM | POA: Diagnosis not present

## 2015-06-20 ENCOUNTER — Ambulatory Visit (INDEPENDENT_AMBULATORY_CARE_PROVIDER_SITE_OTHER): Payer: Medicare PPO | Admitting: Urology

## 2015-06-20 ENCOUNTER — Encounter: Payer: Self-pay | Admitting: Urology

## 2015-06-20 VITALS — BP 131/73 | HR 102 | Ht 63.0 in | Wt 102.0 lb

## 2015-06-20 DIAGNOSIS — N3289 Other specified disorders of bladder: Secondary | ICD-10-CM | POA: Diagnosis not present

## 2015-06-20 LAB — MICROSCOPIC EXAMINATION: RBC, UA: NONE SEEN /hpf (ref 0–?)

## 2015-06-20 LAB — URINALYSIS, COMPLETE
BILIRUBIN UA: NEGATIVE
Glucose, UA: NEGATIVE
Ketones, UA: NEGATIVE
Nitrite, UA: NEGATIVE
PH UA: 6 (ref 5.0–7.5)
PROTEIN UA: NEGATIVE
RBC UA: NEGATIVE
Specific Gravity, UA: 1.02 (ref 1.005–1.030)
UUROB: 1 mg/dL (ref 0.2–1.0)

## 2015-06-20 NOTE — Progress Notes (Signed)
06/20/2015 2:42 PM   Tonya Snyder 1920-11-22 [id number]  Referring provider: Lavera Guise, MD [address] [place], [place] [address]  Chief Complaint  Patient presents with  . New Patient (Initial Visit)    renal cyst,     HPI: 82 year old female presents today for further evaluation of an abnormal ultrasound. The patient was seen in the emergency department in March 2017 for lower abdominal pain as well as urinary frequency and dysuria. A urinalysis at that point demonstrated  Evidence of infection although no urine culture was obtained. She was treated for urinary tract infection and her symptoms simply resolved.as part of the workup the patient underwent a abdominal and pelvic ultrasound. This demonstrated a large upper pulses in the right kidney, simple in nature as well as a thickening of the posterior bladder wall. The patient was then seen in follow-up by her primary care provider and had a normal urinalysis. Today, the patient states that she has had some urinary frequency, but this is her baseline. She is also having some intermittent urinary incontinence which is baseline. She denies any dysuria. She denies any fevers or chills. She denies any flank pain. She has no history of recurrent urinary tract infections or kidney stones. The patient does have dementia and is accompanied by her son who helps as are her questions.   PMH: Past Medical History  Diagnosis Date  . Hypertension   . Dementia   . Hyperlipidemia     Surgical History: Past Surgical History  Procedure Laterality Date  . Abdominal hysterectomy    . Hip surgery      Home Medications:    Medication List       This list is accurate as of: 06/20/15  2:42 PM.  Always use your most recent med list.               amLODipine 5 MG tablet  Commonly known as:  NORVASC  Take 1 tablet by mouth daily.     aspirin EC 81 MG tablet  Take 81 mg by mouth daily.     benazepril 10 MG tablet  Commonly known as:   LOTENSIN  Take 1 tablet by mouth daily.     docusate sodium 100 MG capsule  Commonly known as:  COLACE  Take 100 mg by mouth at bedtime.     hydrochlorothiazide 25 MG tablet  Commonly known as:  HYDRODIURIL  Take 1 tablet by mouth daily.     hydrocortisone cream 1 %  Apply 1 application topically 4 (four) times daily.     mirtazapine 15 MG tablet  Commonly known as:  REMERON  Take 0.5 tablets by mouth at bedtime.     MULTIVITAMIN PO  Take 1 tablet by mouth daily.     potassium chloride 10 MEQ tablet  Commonly known as:  K-DUR,KLOR-CON  Take 1 tablet by mouth daily.     simvastatin 20 MG tablet  Commonly known as:  ZOCOR  Take 1 tablet by mouth at bedtime.        Allergies: No Known Allergies  Family History: Family History  Problem Relation Age of Onset  . Bladder Cancer Neg Hx   . Kidney cancer Neg Hx     Social History:  reports that she has quit smoking. Her smokeless tobacco use includes Snuff. She reports that she does not drink alcohol or use illicit drugs.  ROS: UROLOGY Frequent Urination?: Yes Hard to postpone urination?: No Burning/pain with urination?: No  Get up at night to urinate?: No Leakage of urine?: Yes Urine stream starts and stops?: No Trouble starting stream?: No Do you have to strain to urinate?: No Blood in urine?: No Urinary tract infection?: No Sexually transmitted disease?: No Injury to kidneys or bladder?: No Painful intercourse?: No Weak stream?: No Currently pregnant?: No Vaginal bleeding?: No Last menstrual period?: n  Gastrointestinal Nausea?: No Vomiting?: No Indigestion/heartburn?: No Diarrhea?: No Constipation?: No  Constitutional Fever: No Night sweats?: No Weight loss?: No Fatigue?: No  Skin Skin rash/lesions?: No Itching?: No  Eyes Blurred vision?: No Double vision?: No  Ears/Nose/Throat Sore throat?: No Sinus problems?: No  Hematologic/Lymphatic Swollen glands?: No Easy bruising?:  No  Cardiovascular Leg swelling?: No Chest pain?: No  Respiratory Cough?: No Shortness of breath?: No  Endocrine Excessive thirst?: No  Musculoskeletal Back pain?: No Joint pain?: No  Neurological Headaches?: No Dizziness?: No  Psychologic Depression?: No Anxiety?: No  Physical Exam: BP 131/73 mmHg  Pulse 102  Ht 5\' 3"  (1.6 m)  Wt 102 lb (46.267 kg)  BMI 18.07 kg/m2  Constitutional:  Alert and oriented, No acute distress. HEENT: [place] AT, moist mucus membranes.  Trachea midline, no masses. Cardiovascular: No clubbing, cyanosis, or edema. Respiratory: Normal respiratory effort, no increased work of breathing. GI: Abdomen is soft, nontender, nondistended, no abdominal masses GU: No CVA tenderness.  Skin: No rashes, bruises or suspicious lesions. Lymph: No cervical or inguinal adenopathy. Neurologic: Grossly intact, no focal deficits, moving all 4 extremities. Psychiatric: Normal mood and affect.  Laboratory Data: Lab Results  Component Value Date   WBC 5.5 04/18/2015   HGB 12.6 04/18/2015   HCT 37.1 04/18/2015   MCV 87.4 04/18/2015   PLT 188 04/18/2015    Lab Results  Component Value Date   CREATININE 1.09* 04/18/2015    No results found for: PSA  No results found for: TESTOSTERONE  No results found for: HGBA1C  Urinalysis    Component Value Date/Time   COLORURINE RED* 04/18/2015 1204   APPEARANCEUR CLOUDY* 04/18/2015 1204   LABSPEC 1.015 04/18/2015 1204   PHURINE 7.0 04/18/2015 1204   GLUCOSEU NEGATIVE 04/18/2015 1204   HGBUR 3+* 04/18/2015 [place] 04/18/2015 1204   [place] 04/18/2015 1204   PROTEINUR 100* 04/18/2015 1204   NITRITE NEGATIVE 04/18/2015 1204   LEUKOCYTESUR 2+* 04/18/2015 1204    Pertinent Imaging: I've independently reviewed the patient's abdominal ultrasound noting a large simple cyst in the right upper pole and a mild thickening of the posterior bladder wall.  Assessment & Plan:  The patient  presented to the emergency room on March 2017 with a urinary tract infection. She underwent incidental finding of an simple cyst in the right upper pole and some mild thickening of her posterior bladder wall. Her urinalyses after being treated for her infection has cleared and her symptoms have improved. She does have some baseline voiding symptoms.  1. Bladder wall thickening I went over the ultrasound with the family and reassured the patient that this was a simple appearing cyst with no significant or clinical implications. The patient's thickening of her posterior bladder wall is consistent with a chronic cystitis picture. Given the patient has no blood in her urine today nor did she have any at her most recent PCP evaluation there is no no real concern for malignancy and no reason for further evaluation. The patient does not have a history of recurrent urinary tract infections and as such does not need suppressive antibiotics  or any additional treatment for this. Our plan is to have the patient follow-up if she develops progressive voiding symptoms or urinary tract symptoms that need closer management. - Urinalysis, Complete   Return if symptoms worsen or fail to improve.  Ardis Hughs, [facility] [address] [place], [place] [address] [phone]

## 2015-08-31 DIAGNOSIS — R300 Dysuria: Secondary | ICD-10-CM | POA: Diagnosis not present

## 2015-08-31 DIAGNOSIS — E782 Mixed hyperlipidemia: Secondary | ICD-10-CM | POA: Diagnosis not present

## 2015-08-31 DIAGNOSIS — W19XXXD Unspecified fall, subsequent encounter: Secondary | ICD-10-CM | POA: Diagnosis not present

## 2015-08-31 DIAGNOSIS — E079 Disorder of thyroid, unspecified: Secondary | ICD-10-CM | POA: Diagnosis not present

## 2015-08-31 DIAGNOSIS — E559 Vitamin D deficiency, unspecified: Secondary | ICD-10-CM | POA: Diagnosis not present

## 2015-08-31 DIAGNOSIS — E860 Dehydration: Secondary | ICD-10-CM | POA: Diagnosis not present

## 2015-08-31 DIAGNOSIS — E0781 Sick-euthyroid syndrome: Secondary | ICD-10-CM | POA: Diagnosis not present

## 2015-08-31 DIAGNOSIS — I10 Essential (primary) hypertension: Secondary | ICD-10-CM | POA: Diagnosis not present

## 2015-09-05 DIAGNOSIS — J302 Other seasonal allergic rhinitis: Secondary | ICD-10-CM | POA: Diagnosis not present

## 2015-09-05 DIAGNOSIS — E860 Dehydration: Secondary | ICD-10-CM | POA: Diagnosis not present

## 2015-09-05 DIAGNOSIS — R262 Difficulty in walking, not elsewhere classified: Secondary | ICD-10-CM | POA: Diagnosis not present

## 2015-10-13 ENCOUNTER — Emergency Department: Payer: Medicare PPO

## 2015-10-13 ENCOUNTER — Emergency Department
Admission: EM | Admit: 2015-10-13 | Discharge: 2015-10-13 | Disposition: A | Discharge status: Home / Self Care | Payer: Medicare PPO | Attending: Emergency Medicine | Admitting: Emergency Medicine

## 2015-10-13 ENCOUNTER — Encounter: Payer: Self-pay | Admitting: Medical Oncology

## 2015-10-13 DIAGNOSIS — I10 Essential (primary) hypertension: Secondary | ICD-10-CM | POA: Diagnosis not present

## 2015-10-13 DIAGNOSIS — M25551 Pain in right hip: Secondary | ICD-10-CM | POA: Diagnosis not present

## 2015-10-13 DIAGNOSIS — Z96641 Presence of right artificial hip joint: Secondary | ICD-10-CM | POA: Diagnosis not present

## 2015-10-13 DIAGNOSIS — Z471 Aftercare following joint replacement surgery: Secondary | ICD-10-CM | POA: Diagnosis not present

## 2015-10-13 DIAGNOSIS — K5901 Slow transit constipation: Secondary | ICD-10-CM | POA: Diagnosis not present

## 2015-10-13 DIAGNOSIS — Z79899 Other long term (current) drug therapy: Secondary | ICD-10-CM | POA: Diagnosis not present

## 2015-10-13 DIAGNOSIS — Z7982 Long term (current) use of aspirin: Secondary | ICD-10-CM | POA: Insufficient documentation

## 2015-10-13 DIAGNOSIS — F17290 Nicotine dependence, other tobacco product, uncomplicated: Secondary | ICD-10-CM | POA: Insufficient documentation

## 2015-10-13 MED ORDER — NAPROXEN SODIUM 275 MG PO TABS: 275.0000 mg | ORAL_TABLET | Freq: Every day | ORAL | 2 refills | Status: AC

## 2015-10-13 NOTE — ED Triage Notes (Signed)
Per patient and family pt has been c/o rt hip pain for a while. Pt has had hip replacement 7-8 years ago. Pt denies new injury reports only pain. Pt in na

## 2015-10-13 NOTE — Discharge Instructions (Signed)
Advised to continue Colace constipation.

## 2015-10-13 NOTE — ED Notes (Signed)
See triage note  Has right hip pain for several days  Denies any injury

## 2015-10-13 NOTE — ED Provider Notes (Signed)
[facility] Emergency Department Provider Note   ____________________________________________   None    (approximate)  I have reviewed the triage vital signs and the nursing notes.   HISTORY  Chief Complaint Hip Pain    HPI Tonya Snyder is a 82 y.o. female patient were to complain of increasing right hip pain times one week. Patient denies any provocative incident for her pain. Patient status post 7-8 year right hip replacement.Patient's rate the pain as a 10 over 10. No palliative measures for this complaint. Family members are given supportive information from patient complaint.   Past Medical History:  Diagnosis Date  . Dementia   . Hyperlipidemia   . Hypertension     Patient Active Problem List   Diagnosis Date Noted  . Protein-calorie malnutrition, severe ([place]) 07/09/2014  . Pneumonia 07/07/2014  . Fall 07/07/2014  . Hypokalemia 07/07/2014  . Lipoma of arm 10/14/2013    Past Surgical History:  Procedure Laterality Date  . ABDOMINAL HYSTERECTOMY    . HIP SURGERY      Prior to Admission medications   Medication Sig Start Date End Date Taking? Authorizing Provider  amLODipine (NORVASC) 5 MG tablet Take 1 tablet by mouth daily. 10/05/13   Historical Provider, MD  aspirin EC 81 MG tablet Take 81 mg by mouth daily.    Historical Provider, MD  benazepril (LOTENSIN) 10 MG tablet Take 1 tablet by mouth daily. 10/05/13   Historical Provider, MD  docusate sodium (COLACE) 100 MG capsule Take 100 mg by mouth at bedtime.    Historical Provider, MD  hydrochlorothiazide (HYDRODIURIL) 25 MG tablet Take 1 tablet by mouth daily. 10/05/13   Historical Provider, MD  hydrocortisone cream 1 % Apply 1 application topically 4 (four) times daily.    Historical Provider, MD  mirtazapine (REMERON) 15 MG tablet Take 0.5 tablets by mouth at bedtime.     Historical Provider, MD  Multiple Vitamins-Minerals (MULTIVITAMIN PO) Take 1 tablet by mouth daily. 07/27/13    Historical Provider, MD  naproxen sodium (ANAPROX) 275 MG tablet Take 1 tablet (275 mg total) by mouth daily. 10/13/15 10/12/16  Sable Feil, PA-C  potassium chloride (K-DUR,KLOR-CON) 10 MEQ tablet Take 1 tablet by mouth daily. 10/05/13   Historical Provider, MD  simvastatin (ZOCOR) 20 MG tablet Take 1 tablet by mouth at bedtime.     Historical Provider, MD    Allergies Review of patient's allergies indicates no known allergies.  Family History  Problem Relation Age of Onset  . Bladder Cancer Neg Hx   . Kidney cancer Neg Hx     Social History Social History  Substance Use Topics  . Smoking status: Former Research scientist (life sciences)  . Smokeless tobacco: Current User    Types: Snuff  . Alcohol use No    Review of Systems Constitutional: No fever/chills Eyes: No visual changes. ENT: No sore throat. Cardiovascular: Denies chest pain. Respiratory: Denies shortness of breath. Gastrointestinal: No abdominal pain.  No nausea, no vomiting.  No diarrhea.  No constipation. Genitourinary: Negative for dysuria. Musculoskeletal: Right hip pain Skin: Negative for rash. Neurological: Negative for headaches, focal weakness or numbness. Psychiatric:Dementia Endocrine:Hypertension hyperlipidemia ____________________________________________   PHYSICAL EXAM:  VITAL SIGNS: ED Triage Vitals [10/13/15 1003]  Enc Vitals Group     BP (!) 150/59     Pulse Rate 88     Resp 18     Temp 98.3 F (36.8 C)     Temp Source Oral     SpO2  97 %     Weight 102 lb (46.3 kg)     Height 5' (1.524 m)     Head Circumference      Peak Flow      Pain Score 10     Pain Loc      Pain Edu?      Excl. in [place]?     Constitutional: Alert and oriented. Well appearing and in no acute distress. Eyes: Conjunctivae are normal. PERRL. EOMI. Head: Atraumatic. Nose: No congestion/rhinnorhea. Mouth/Throat: Mucous membranes are moist.  Oropharynx non-erythematous. Neck: No stridor.  No cervical spine tenderness to  palpation. Hematological/Lymphatic/Immunilogical: No cervical lymphadenopathy. Cardiovascular: Normal rate, regular rhythm. Grossly normal heart sounds.  Good peripheral circulation. Respiratory: Normal respiratory effort.  No retractions. Lungs CTAB. Gastrointestinal: Soft and nontender. No distention. No abdominal bruits. No CVA tenderness. Musculoskeletal: No obvious deformity of the right hip. No leg length discrepancy. Patient states pain with palpation of the greater trochanter. Patient not weightbearing at this time. . Neurologic:  Normal speech and language. No gross focal neurologic deficits are appreciated. No gait instability. Skin:  Skin is warm, dry and intact. No rash noted. Psychiatric: Mood and affect are normal. Speech and behavior are normal.  ____________________________________________   LABS (all labs ordered are listed, but only abnormal results are displayed)  Labs Reviewed - No data to display ____________________________________________  EKG   ____________________________________________  RADIOLOGY   ____________________________________________   PROCEDURES  Procedure(s) performed: None  Procedures  Critical Care performed: No  ____________________________________________   INITIAL IMPRESSION / ASSESSMENT AND PLAN / ED COURSE  Pertinent labs & imaging results that were available during my care of the patient were reviewed by me and considered in my medical decision making (see chart for details). Right hip pain. Discussed negative findings x-ray of the right hip. Also discussed incidental finding of constipation. Advised to continue previous medications and to consider a mild stool softener to relieve constipation.   Clinical Course     ____________________________________________   FINAL CLINICAL IMPRESSION(S) / ED DIAGNOSES  Final diagnoses:  Right hip pain  Constipation by delayed colonic transit      NEW MEDICATIONS STARTED  DURING THIS VISIT:  New Prescriptions   NAPROXEN SODIUM (ANAPROX) 275 MG TABLET    Take 1 tablet (275 mg total) by mouth daily.     Note:  This document was prepared using Dragon voice recognition software and may include unintentional dictation errors.    Sable Feil, PA-C 10/13/15 [place], MD 10/13/15 [phone]

## 2015-10-25 DIAGNOSIS — N390 Urinary tract infection, site not specified: Secondary | ICD-10-CM | POA: Diagnosis not present

## 2015-10-25 DIAGNOSIS — E860 Dehydration: Secondary | ICD-10-CM | POA: Diagnosis not present

## 2015-10-25 DIAGNOSIS — I10 Essential (primary) hypertension: Secondary | ICD-10-CM | POA: Diagnosis not present

## 2015-10-25 DIAGNOSIS — M25551 Pain in right hip: Secondary | ICD-10-CM | POA: Diagnosis not present

## 2015-10-25 DIAGNOSIS — K5900 Constipation, unspecified: Secondary | ICD-10-CM | POA: Diagnosis not present

## 2015-12-11 ENCOUNTER — Emergency Department: Payer: Medicare PPO

## 2015-12-11 ENCOUNTER — Encounter: Payer: Self-pay | Admitting: Emergency Medicine

## 2015-12-11 ENCOUNTER — Emergency Department
Admission: EM | Admit: 2015-12-11 | Discharge: 2015-12-12 | Disposition: A | Discharge status: Home / Self Care | Payer: Medicare PPO | Attending: Student in an Organized Health Care Education/Training Program | Admitting: Student in an Organized Health Care Education/Training Program

## 2015-12-11 DIAGNOSIS — G308 Other Alzheimer's disease: Secondary | ICD-10-CM

## 2015-12-11 DIAGNOSIS — Z79899 Other long term (current) drug therapy: Secondary | ICD-10-CM | POA: Insufficient documentation

## 2015-12-11 DIAGNOSIS — F17290 Nicotine dependence, other tobacco product, uncomplicated: Secondary | ICD-10-CM | POA: Diagnosis not present

## 2015-12-11 DIAGNOSIS — Y939 Activity, unspecified: Secondary | ICD-10-CM | POA: Diagnosis not present

## 2015-12-11 DIAGNOSIS — G309 Alzheimer's disease, unspecified: Secondary | ICD-10-CM | POA: Diagnosis not present

## 2015-12-11 DIAGNOSIS — I10 Essential (primary) hypertension: Secondary | ICD-10-CM | POA: Diagnosis not present

## 2015-12-11 DIAGNOSIS — Y929 Unspecified place or not applicable: Secondary | ICD-10-CM | POA: Diagnosis not present

## 2015-12-11 DIAGNOSIS — R262 Difficulty in walking, not elsewhere classified: Secondary | ICD-10-CM

## 2015-12-11 DIAGNOSIS — M25551 Pain in right hip: Secondary | ICD-10-CM | POA: Insufficient documentation

## 2015-12-11 DIAGNOSIS — F0280 Dementia in other diseases classified elsewhere without behavioral disturbance: Secondary | ICD-10-CM

## 2015-12-11 DIAGNOSIS — Y999 Unspecified external cause status: Secondary | ICD-10-CM | POA: Diagnosis not present

## 2015-12-11 DIAGNOSIS — W1839XA Other fall on same level, initial encounter: Secondary | ICD-10-CM | POA: Diagnosis not present

## 2015-12-11 DIAGNOSIS — R4182 Altered mental status, unspecified: Secondary | ICD-10-CM | POA: Diagnosis not present

## 2015-12-11 DIAGNOSIS — S79911A Unspecified injury of right hip, initial encounter: Secondary | ICD-10-CM | POA: Diagnosis not present

## 2015-12-11 DIAGNOSIS — R911 Solitary pulmonary nodule: Secondary | ICD-10-CM | POA: Insufficient documentation

## 2015-12-11 DIAGNOSIS — Z7982 Long term (current) use of aspirin: Secondary | ICD-10-CM | POA: Insufficient documentation

## 2015-12-11 DIAGNOSIS — S0990XA Unspecified injury of head, initial encounter: Secondary | ICD-10-CM | POA: Diagnosis not present

## 2015-12-11 LAB — URINALYSIS COMPLETE WITH MICROSCOPIC (ARMC ONLY)
BACTERIA UA: NONE SEEN
Bilirubin Urine: NEGATIVE
GLUCOSE, UA: NEGATIVE mg/dL
HGB URINE DIPSTICK: NEGATIVE
Ketones, ur: NEGATIVE mg/dL
LEUKOCYTES UA: NEGATIVE
NITRITE: NEGATIVE
Protein, ur: NEGATIVE mg/dL
SPECIFIC GRAVITY, URINE: 1.009 (ref 1.005–1.030)
pH: 7 (ref 5.0–8.0)

## 2015-12-11 LAB — COMPREHENSIVE METABOLIC PANEL
ALT: 15 U/L (ref 14–54)
ANION GAP: 7 (ref 5–15)
AST: 24 U/L (ref 15–41)
Albumin: 3.9 g/dL (ref 3.5–5.0)
Alkaline Phosphatase: 68 U/L (ref 38–126)
BUN: 14 mg/dL (ref 6–20)
CALCIUM: 9.2 mg/dL (ref 8.9–10.3)
CHLORIDE: 100 mmol/L — AB (ref 101–111)
CO2: 28 mmol/L (ref 22–32)
Creatinine, Ser: 0.79 mg/dL (ref 0.44–1.00)
GFR calc non Af Amer: >60 mL/min (ref >60)
Glucose, Bld: 92 mg/dL (ref 65–99)
POTASSIUM: 4 mmol/L (ref 3.5–5.1)
SODIUM: 135 mmol/L (ref 135–145)
Total Bilirubin: 0.6 mg/dL (ref 0.3–1.2)
Total Protein: 7.7 g/dL (ref 6.5–8.1)

## 2015-12-11 LAB — CBC WITH DIFFERENTIAL/PLATELET
Basophils Absolute: 0 10*3/uL (ref 0–0.1)
Basophils Relative: 1 %
EOS ABS: 0.2 10*3/uL (ref 0–0.7)
EOS PCT: 3 %
HCT: 39.4 % (ref 35.0–47.0)
Hemoglobin: 13.7 g/dL (ref 12.0–16.0)
LYMPHS ABS: 1 10*3/uL (ref 1.0–3.6)
Lymphocytes Relative: 17 %
MCH: 30.3 pg (ref 26.0–34.0)
MCHC: 34.7 g/dL (ref 32.0–36.0)
MCV: 87.3 fL (ref 80.0–100.0)
MONOS PCT: 13 %
Monocytes Absolute: 0.8 10*3/uL (ref 0.2–0.9)
Neutro Abs: 3.8 10*3/uL (ref 1.4–6.5)
Neutrophils Relative %: 66 %
PLATELETS: 212 10*3/uL (ref 150–440)
RBC: 4.51 MIL/uL (ref 3.80–5.20)
RDW: 14.7 % — AB (ref 11.5–14.5)
WBC: 5.8 10*3/uL (ref 3.6–11.0)

## 2015-12-11 LAB — GLUCOSE, CAPILLARY: Glucose-Capillary: 95 mg/dL (ref 65–99)

## 2015-12-11 LAB — TROPONIN I

## 2015-12-11 MED ORDER — SIMVASTATIN 20 MG PO TABS
20.0000 mg | ORAL_TABLET | Freq: Every day | ORAL | Status: DC
  Administered 2015-12-11: 20 mg via ORAL
  Filled 2015-12-11 (×2): qty 1

## 2015-12-11 MED ORDER — AMLODIPINE BESYLATE 5 MG PO TABS: 10.0000 mg | ORAL_TABLET | Freq: Once | ORAL | Status: DC

## 2015-12-11 MED ORDER — MIRTAZAPINE 15 MG PO TABS
7.5000 mg | ORAL_TABLET | Freq: Every day | ORAL | Status: DC
  Administered 2015-12-11: 7.5 mg via ORAL
  Filled 2015-12-11: qty 1

## 2015-12-11 MED ORDER — DOCUSATE SODIUM 100 MG PO CAPS
100.0000 mg | ORAL_CAPSULE | Freq: Every day | ORAL | Status: DC
  Administered 2015-12-11: 100 mg via ORAL
  Filled 2015-12-11: qty 1

## 2015-12-11 MED ORDER — ASPIRIN EC 81 MG PO TBEC
81.0000 mg | DELAYED_RELEASE_TABLET | Freq: Every day | ORAL | Status: DC
  Administered 2015-12-11 – 2015-12-12 (×2): 81 mg via ORAL
  Filled 2015-12-11 (×2): qty 1

## 2015-12-11 MED ORDER — BENAZEPRIL HCL 10 MG PO TABS
10.0000 mg | ORAL_TABLET | Freq: Every day | ORAL | Status: DC
  Administered 2015-12-11 – 2015-12-12 (×2): 10 mg via ORAL
  Filled 2015-12-11 (×3): qty 1

## 2015-12-11 MED ORDER — AMLODIPINE BESYLATE 5 MG PO TABS
5.0000 mg | ORAL_TABLET | Freq: Every day | ORAL | Status: DC
  Administered 2015-12-11 – 2015-12-12 (×2): 5 mg via ORAL
  Filled 2015-12-11 (×2): qty 1

## 2015-12-11 MED ORDER — HYDROCHLOROTHIAZIDE 25 MG PO TABS
25.0000 mg | ORAL_TABLET | Freq: Every day | ORAL | Status: DC
Start: 2015-12-11 — End: 2015-12-12
  Administered 2015-12-11 – 2015-12-12 (×2): 25 mg via ORAL
  Filled 2015-12-11 (×2): qty 1
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
## 2015-12-11 NOTE — Progress Notes (Signed)
LCSW met with family and completed assessment and Fl2 and obtained patient Passr #. LCSW placed information via the HUB for potential bed offers. It was explained at length of in home supports and that we are awaitng a PT consult.  In consultation with other LCSW authorization can only be attained once bed offers made, SNF is chosen and PT consult is done. This information will be forwarded to LCSW weekdays.  [employer] LCSW [phone]
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
## 2015-12-11 NOTE — ED Notes (Signed)
Patient transported to X-ray and then CT 

## 2015-12-11 NOTE — Clinical Social Work Note (Signed)
Clinical Social Work Assessment  Patient Details  Name: Tonya Snyder MRN: [id number] Date of Birth: 05/13/1920  Date of referral:  12/11/15               Reason for consult:  Facility Placement                Permission sought to share information with:  Family Supports, Customer service manager Permission granted to share information::  Yes, Verbal Permission Granted  Name::        Agency::     Relationship::     Contact Information:  Talaya Wiebke [id number] HCPOA  Laurene Footman [phone] Grandson  Housing/Transportation Living arrangements for the past 2 months:  No permanent address (Floated between family members) Source of Information:  Adult Children Patient Interpreter Needed:  None Criminal Activity/Legal Involvement Pertinent to Current Situation/Hospitalization:  No - Comment as needed Significant Relationships:  Adult Children, Other Family Members, Church Lives with:  Adult Children, Relatives Do you feel safe going back to the place where you live?  No Need for family participation in patient care:  Yes (Comment)  Care giving concerns: Family unable to provide essential care for her at this time, and feel she needs to be placed in SNF rehab as she has alzeihmers and fell recently   Facilities manager / plan: LCSW introduced myself to patient and grandson and son ( POA) and explained the purpose of my visit. Family reported that they have been providing care through varying family members and patient has been going  Between 3 separate places for the last while to provide 24 support. He primary care givers unable to manage patient. Her family would like her placed in SNF/ALF. It was explained 3 day qualifying period for rehab and they understood she will not meet medicare requirement and will have to pay privately. Patients son agreed for LCSW to send out information to different facilities. Patient is incontinent and can use a walker and requires  limited assist  With her ADLs. She is oriented to herself and has fluctuating orientation and was diagnosed with alzheimer  Her insurance is  [employer]/Medicare/ Family will apply for medicaid.  Employment status:  Retired Forensic scientist:  Medicare PT Recommendations:  Not assessed at this time Central / Referral to community resources:  [place]  Patient/Family's Response to care: She needs assistance now   Patient/Family's Understanding of and Emotional Response to Diagnosis, Current Treatment, and Prognosis: They love this women but simply have no one to care for her and even with paid private care it has become to much for her current cargiver's.  Emotional Assessment Appearance:  Appears stated age Attitude/Demeanor/Rapport:   (Patient is kind and polite) Affect (typically observed):  Adaptable, Accepting, Calm, Defensive, Explosive Orientation:  Oriented to Self, Fluctuating Orientation (Suspected and/or reported Sundowners) Alcohol / Substance use:  Never Used Psych involvement (Current and /or in the community):  No (Comment)  Discharge Needs  Concerns to be addressed:  Cognitive Concerns, Home Safety Concerns Readmission within the last 30 days:  No Current discharge risk:  Cognitively Impaired, Lack of support system Barriers to Discharge:  Continued Medical Work up   [place], LCSW 12/11/2015, 1:57 PM

## 2015-12-11 NOTE — ED Notes (Signed)
PT at bedside working with pt.

## 2015-12-11 NOTE — NC FL2 (Signed)
  [place] LEVEL OF CARE SCREENING TOOL     IDENTIFICATION  Patient Name: Tonya Snyder Birthdate: [birth year] Sex: female Admission Date (Current Location): 12/11/2015  [place] and [place] Number:  Engineering geologist and Address:  [facility], [address], [place], [place] [address]      Provider Number: [id number]  Attending Physician Name and Address:  Merlyn Lot, MD  Relative Name and Phone Number:       Current Level of Care: Hospital Recommended Level of Care: [place] Prior Approval Number:    Date Approved/Denied:   PASRR Number:  [id number]  Discharge Plan: SNF    Current Diagnoses: Patient Active Problem List   Diagnosis Date Noted  . Protein-calorie malnutrition, severe ([place]) 07/09/2014  . Pneumonia 07/07/2014  . Fall 07/07/2014  . Hypokalemia 07/07/2014  . Lipoma of arm 10/14/2013    Orientation RESPIRATION BLADDER Height & Weight     Self (Cognition fluctuates/alzeihmers)  Normal Incontinent Weight: 97 lb (44 kg) Height:  5\' 2"  (157.5 cm)  BEHAVIORAL SYMPTOMS/MOOD NEUROLOGICAL BOWEL NUTRITION STATUS      Incontinent Diet (Normal)  AMBULATORY STATUS COMMUNICATION OF NEEDS Skin   Limited Assist Verbally Normal                       Personal Care Assistance Level of Assistance  Bathing, Dressing, Total care, Feeding Bathing Assistance: Limited assistance Feeding assistance: Limited assistance Dressing Assistance: Limited assistance Total Care Assistance: Limited assistance   Functional Limitations Info  Sight, Hearing, Speech Sight Info: Adequate Hearing Info: Impaired (Hard of hearing) Speech Info: Adequate    SPECIAL CARE FACTORS FREQUENCY  PT (By licensed PT), OT (By licensed OT)     PT Frequency: x5 OT Frequency: x5            Contractures      Additional Factors Info                  Current Medications (12/11/2015):  This is the current hospital  active medication list No current facility-administered medications for this encounter.    Current Outpatient Prescriptions  Medication Sig Dispense Refill  . amLODipine (NORVASC) 5 MG tablet Take 1 tablet by mouth daily.    Marland Kitchen aspirin EC 81 MG tablet Take 81 mg by mouth daily.    . benazepril (LOTENSIN) 10 MG tablet Take 1 tablet by mouth daily.    Marland Kitchen docusate sodium (COLACE) 100 MG capsule Take 100 mg by mouth at bedtime.    . hydrochlorothiazide (HYDRODIURIL) 25 MG tablet Take 1 tablet by mouth daily.    . hydrocortisone cream 1 % Apply 1 application topically 4 (four) times daily.    . mirtazapine (REMERON) 15 MG tablet Take 0.5 tablets by mouth at bedtime.     . Multiple Vitamins-Minerals (MULTIVITAMIN PO) Take 1 tablet by mouth daily.    . naproxen sodium (ANAPROX) 275 MG tablet Take 1 tablet (275 mg total) by mouth daily. 10 tablet 2  . potassium chloride (K-DUR,KLOR-CON) 10 MEQ tablet Take 1 tablet by mouth daily.    . simvastatin (ZOCOR) 20 MG tablet Take 1 tablet by mouth at bedtime.        Discharge Medications: Please see discharge summary for a list of discharge medications.  Relevant Imaging Results:  Relevant Lab Results:   Additional Information SSN [id number]  [name], Media

## 2015-12-11 NOTE — ED Triage Notes (Signed)
Fell last night - rt hip pain. Able to bear some weight

## 2015-12-11 NOTE — Evaluation (Signed)
Physical Therapy Evaluation Patient Details Name: Tonya Snyder MRN: [id number] DOB: [birth year] Today's Date: 12/11/2015   History of Present Illness  Patient presents with fall at home and R hip pain, leading to antalgic gait. Per notes it appears family is increasingly concerned they cannot manage her in the home and are seeking long term care. X-rays negative of R hip.   Clinical Impression  Patient presents after complaints of R hip pain after fall at home. She has a history of a fall on her R side leading to fx and replacement several years ago, imaging on this admission is negative for acute changes. From family present, patient has been growing increasingly difficult to manage in the home and has been falling more frequently. She has also been complaining of R hip pain (appears chronic as several previous PT notes have noted her antalgic gait pattern and R hip pain). Today however, she denies pain and is able to stand without PT assistance with RW and ambulate in the hallway with symmetrical gait pattern, no antalgia noted. She does scissor and have flexed posture, indicative of falls risk but no overt loss of balance noted. After reviewing several previous notes of therapists at this facility, this seems to be an anomaly as she has previously been quite limited by her hip and mobility status. From this session alone she appears at or above her recent baseline, however given the family's concern and her previous documented history will recommend additional therapy session at a different point in the day for a more appropriate representation of patient's mobility. She may be more appropriate for long term care from the discussion therapist had in the room with family.     Follow Up Recommendations Home health PT/24 Hour assistance     Equipment Recommendations       Recommendations for Other Services       Precautions / Restrictions Precautions Precautions: Fall Restrictions Weight  Bearing Restrictions: No      Mobility  Bed Mobility Overal bed mobility: Needs Assistance Bed Mobility: Sit to Supine;Supine to Sit     Supine to sit: Supervision;Min guard Sit to supine: Mod assist   General bed mobility comments: Patient had difficulty bringing LEs over the edge of the bed, did not require assistance with transitioning supine to sit.   Transfers Overall transfer level: Modified independent Equipment used: Rolling walker (2 wheeled)             General transfer comment: Patient able to stand without physical assistance for balance or strength, no loss of balance or side shifting noted.   Ambulation/Gait Ambulation/Gait assistance: Supervision Ambulation Distance (Feet): 100 Feet Assistive device: Rolling walker (2 wheeled) Gait Pattern/deviations: Step-through pattern;Narrow base of support;Trunk flexed   Gait velocity interpretation: Below normal speed for age/gender General Gait Details: Patient ambulates without antalgic gait pattern, noted to have intermittent scissoring in gait. She does have RW anterior to her COM, though able to correct temporarily with cuing from therapist.   Stairs            Wheelchair Mobility    Modified Rankin (Stroke Patients Only)       Balance Overall balance assessment: History of Falls;Needs assistance Sitting-balance support: No upper extremity supported Sitting balance-Leahy Scale: Good     Standing balance support: Bilateral upper extremity supported Standing balance-Leahy Scale: Good  Pertinent Vitals/Pain Pain Assessment: No/denies pain    Home Living Family/patient expects to be discharged to:: Private residence Living Arrangements: Alone Available Help at Discharge: Family;Available PRN/intermittently Type of Home: House         Home Equipment: Walker - 2 wheels;Cane - single point      Prior Function Level of Independence: Independent with  assistive device(s)         Comments: Patient has been ambulating in the home with a device primarily, but appears forgetful and falls when she does not have device. She has dementia and does not seem to remember her dependence on device.      Hand Dominance        Extremity/Trunk Assessment   Upper Extremity Assessment: Overall WFL for tasks assessed           Lower Extremity Assessment: Overall WFL for tasks assessed         Communication   Communication: No difficulties  Cognition Arousal/Alertness: Awake/alert Behavior During Therapy: WFL for tasks assessed/performed Overall Cognitive Status: History of cognitive impairments - at baseline                      General Comments      Exercises     Assessment/Plan    PT Assessment Patient needs continued PT services  PT Problem List Decreased strength;Decreased balance;Decreased safety awareness;Decreased knowledge of use of DME          PT Treatment Interventions DME instruction;Gait training;Stair training;Therapeutic activities;Therapeutic exercise;Balance training    PT Goals (Current goals can be found in the Care Plan section)  Acute Rehab PT Goals Patient Stated Goal: To improve her independence and safety with mobility.  PT Goal Formulation: With family Time For Goal Achievement: 12/25/15 Potential to Achieve Goals: Good    Frequency Min 2X/week   Barriers to discharge Decreased caregiver support Patient has been living alone with caregiver support, though it appears they are not able to manage her safely.     Co-evaluation               End of Session   Activity Tolerance: Patient tolerated treatment well Patient left: in bed;with family/visitor present Nurse Communication: Mobility status    Functional Assessment Tool Used: Clinical judgement  Functional Limitation: Mobility: Walking and moving around Mobility: Walking and Moving Around Current Status [id number]): At least  1 percent but less than 20 percent impaired, limited or restricted Mobility: Walking and Moving Around Goal Status [phone]): At least 1 percent but less than 20 percent impaired, limited or restricted    Time: [id number] PT Time Calculation (min) (ACUTE ONLY): 15 min   Charges:   PT Evaluation $PT Eval Low Complexity: 1 Procedure     PT G Codes:   PT G-Codes **NOT FOR INPATIENT CLASS** Functional Assessment Tool Used: Clinical judgement  Functional Limitation: Mobility: Walking and moving around Mobility: Walking and Moving Around Current Status [id number]): At least 1 percent but less than 20 percent impaired, limited or restricted Mobility: Walking and Moving Around Goal Status [phone]): At least 1 percent but less than 20 percent impaired, limited or restricted   Kerman Passey, PT, DPT    12/11/2015, 5:29 PM

## 2015-12-11 NOTE — Discharge Instructions (Addendum)
Chest XR results.   FINDINGS: There is a poorly defined 3.6 cm mass in the left lung base, most likely representing of malignancy. The lungs are otherwise clear. Heart size and pulmonary vascularity are normal. Extensive calcification in the thoracic aorta. There is a compression fracture of T9 but this appears to be only minimally more prominent than on the prior study of 07/07/2014.   IMPRESSION: 1. 3.6 cm mass in the left lung base  2. Aortic atherosclerosis.

## 2015-12-11 NOTE — ED Notes (Addendum)
Son states has alzheimers with multiple falls. Would like to speak with a Education officer, museum. Pt in nad but hip is sore with palpation. No other injuries

## 2015-12-12 NOTE — ED Notes (Signed)
Pt given breakfast tray and juice, eating at this time.

## 2015-12-12 NOTE — ED Notes (Signed)
Pt alert and cooperative upon discharge. Left with daughter and son.

## 2015-12-12 NOTE — ED Notes (Addendum)
Pt left with her purse. No belongings left in room. Gilford Rile that was in patient room was hospital property, and for use while in the hospital only. Not sent home with patient. Pt has one at her living places per family member. Daughter signed e-signature for discharge paperwork.

## 2015-12-12 NOTE — ED Notes (Signed)
Pt sitting on bench, given remote, coffee and pillow. Denies further nees.

## 2015-12-12 NOTE — Progress Notes (Signed)
CSW met with pt and pt's family at pt's bedside. Pt's family had several questions about why pt could not be placed from the ED. CSW explained that pt's insurance would not authorize a SNF stay because SNF is not the recommended level of care per PT evaluation. CSW suggested that the family apply for Medicaid for the pt as Medicaid would cover long term placement for pt. Pt will also not be able to receive home health services as pt will be living in several different cities and home health will not be able to follow her. Pt's family will share in caring for pt and pt will continue to follow up with her PCP.   Georga Kaufmann, MSW, [place]

## 2015-12-12 NOTE — ED Provider Notes (Signed)
Family has opted to care for the patient. They live in 4 states hence we are unable to provide consistent home health services, they are aware of this.    Lavonia Drafts, MD 12/12/15 [phone]

## 2015-12-12 NOTE — ED Notes (Signed)
Social worker called this RN to inform that patient family is on the way and patient will be discharged home instead of placement to a facility.

## 2015-12-12 NOTE — ED Notes (Signed)
CSW at bedside.

## 2015-12-12 NOTE — Care Management Note (Signed)
Case Management Note  Patient Details  Name: Tonya Snyder MRN: [id number] Date of Birth: February 16, 1920  Subjective/Objective:   Patient family here at bedside. They describe a situation where the patient is going to be cared  For by family members, who   Will care for her in 4 separate cities in their homes.Therefore the [facility] has been cancelled because they will  Share the duty of caring a week at a time.               Action/Plan:   Expected Discharge Date:                  Expected Discharge Plan:     In-House Referral:     Discharge planning Services     Post Acute Care Choice:    Choice offered to:     DME Arranged:    DME Agency:     HH Arranged:    HH Agency:     Status of Service:     If discussed at [employer] of Stay Meetings, dates discussed:    Additional Comments:  Beau Fanny, RN 12/12/2015, 10:49 AM

## 2015-12-12 NOTE — ED Notes (Signed)
Family at bedside. States that patient does not live at one particular address and goes back and forth between children's home. Case Manager, Malachy Mood in room to speak with patient. Pt family wondering why patient cannot be placed; spoke with Education officer, museum and requested that she come speak with family. She states that she will. Family informed. Family at beside currently.

## 2015-12-12 NOTE — Progress Notes (Signed)
CSW called pt's son, Jenny Reichmann [phone]. CSW stated that PT has evaluated pt and is recommending home health PT. CSW will be unable to pt placement in a facility as it has not been recommended as the appropriate level of care. CSW stated that pt will likely need to d/c today and inquired about when the family will be available to pick the pt up. John expressed his understanding and states that he will be on the way to the hospital shortly. RN notified. CSW also contacted RNCM about pt's home health needs. No further social work needs at this time, [place] signing off. However, CSW is available should another need arise.  Georga Kaufmann, MSW, [place]

## 2015-12-12 NOTE — ED Provider Notes (Signed)
-----------------------------------------   6:46 AM on 12/12/2015 -----------------------------------------   Blood pressure (!) 183/102, pulse 86, temperature 98.1 F (36.7 C), temperature source Oral, resp. rate 18, height 5\' 2"  (1.575 m), weight 97 lb (44 kg), SpO2 100 %.  The patient had no acute events since last update.  Calm and cooperative at this time.  Disposition is pending PT/CSW team recommendations.     Paulette Blanch, MD 12/12/15 [phone]

## 2015-12-12 NOTE — Care Management Note (Addendum)
Case Management Note  Patient Details  Name: Tonya Snyder MRN: [id number] Date of Birth: 05/26/1920  Subjective/Objective:   I have started to set up [facility] services for this patient , who is pleasant but appears demented. She has a son who is enroute, so I will verify address and phone info with him.     CSW aware . Dr Corky Downs is completing the face to face right now. Referral called to [facility] with [facility], and accepted. Action/Plan:   Expected Discharge Date:                  Expected Discharge Plan:     In-House Referral:     Discharge planning Services     Post Acute Care Choice:    Choice offered to:     DME Arranged:    DME Agency:     HH Arranged:    HH Agency:     Status of Service:     If discussed at [employer] of Stay Meetings, dates discussed:    Additional Comments:  Beau Fanny, RN 12/12/2015, 10:38 AM

## 2015-12-28 DIAGNOSIS — Z515 Encounter for palliative care: Secondary | ICD-10-CM | POA: Diagnosis not present

## 2015-12-28 DIAGNOSIS — S32019A Unspecified fracture of first lumbar vertebra, initial encounter for closed fracture: Secondary | ICD-10-CM | POA: Diagnosis not present

## 2015-12-28 DIAGNOSIS — F0280 Dementia in other diseases classified elsewhere without behavioral disturbance: Secondary | ICD-10-CM | POA: Diagnosis not present

## 2015-12-28 DIAGNOSIS — K7689 Other specified diseases of liver: Secondary | ICD-10-CM | POA: Diagnosis not present

## 2015-12-28 DIAGNOSIS — M85859 Other specified disorders of bone density and structure, unspecified thigh: Secondary | ICD-10-CM | POA: Diagnosis not present

## 2015-12-28 DIAGNOSIS — S3993XA Unspecified injury of pelvis, initial encounter: Secondary | ICD-10-CM | POA: Diagnosis not present

## 2015-12-28 DIAGNOSIS — I6529 Occlusion and stenosis of unspecified carotid artery: Secondary | ICD-10-CM | POA: Diagnosis not present

## 2015-12-28 DIAGNOSIS — S79911A Unspecified injury of right hip, initial encounter: Secondary | ICD-10-CM | POA: Diagnosis not present

## 2015-12-28 DIAGNOSIS — R9431 Abnormal electrocardiogram [ECG] [EKG]: Secondary | ICD-10-CM | POA: Diagnosis not present

## 2015-12-28 DIAGNOSIS — G309 Alzheimer's disease, unspecified: Secondary | ICD-10-CM | POA: Diagnosis not present

## 2015-12-28 DIAGNOSIS — I252 Old myocardial infarction: Secondary | ICD-10-CM | POA: Diagnosis not present

## 2015-12-28 DIAGNOSIS — M85851 Other specified disorders of bone density and structure, right thigh: Secondary | ICD-10-CM | POA: Diagnosis not present

## 2015-12-28 DIAGNOSIS — Z96641 Presence of right artificial hip joint: Secondary | ICD-10-CM | POA: Diagnosis not present

## 2015-12-28 DIAGNOSIS — N2889 Other specified disorders of kidney and ureter: Secondary | ICD-10-CM | POA: Diagnosis not present

## 2015-12-28 DIAGNOSIS — W19XXXA Unspecified fall, initial encounter: Secondary | ICD-10-CM | POA: Diagnosis not present

## 2015-12-28 DIAGNOSIS — K449 Diaphragmatic hernia without obstruction or gangrene: Secondary | ICD-10-CM | POA: Diagnosis not present

## 2015-12-28 DIAGNOSIS — C649 Malignant neoplasm of unspecified kidney, except renal pelvis: Secondary | ICD-10-CM | POA: Diagnosis not present

## 2015-12-28 DIAGNOSIS — I499 Cardiac arrhythmia, unspecified: Secondary | ICD-10-CM | POA: Diagnosis not present

## 2015-12-28 DIAGNOSIS — M25552 Pain in left hip: Secondary | ICD-10-CM | POA: Diagnosis not present

## 2015-12-28 DIAGNOSIS — S22070A Wedge compression fracture of T9-T10 vertebra, initial encounter for closed fracture: Secondary | ICD-10-CM | POA: Diagnosis not present

## 2015-12-28 DIAGNOSIS — M5137 Other intervertebral disc degeneration, lumbosacral region: Secondary | ICD-10-CM | POA: Diagnosis not present

## 2015-12-28 DIAGNOSIS — R102 Pelvic and perineal pain: Secondary | ICD-10-CM | POA: Diagnosis not present

## 2015-12-28 DIAGNOSIS — S32009A Unspecified fracture of unspecified lumbar vertebra, initial encounter for closed fracture: Secondary | ICD-10-CM | POA: Diagnosis not present

## 2015-12-28 DIAGNOSIS — S0990XA Unspecified injury of head, initial encounter: Secondary | ICD-10-CM | POA: Diagnosis not present

## 2015-12-28 DIAGNOSIS — I714 Abdominal aortic aneurysm, without rupture: Secondary | ICD-10-CM | POA: Diagnosis not present

## 2015-12-28 DIAGNOSIS — R911 Solitary pulmonary nodule: Secondary | ICD-10-CM | POA: Diagnosis not present

## 2015-12-28 DIAGNOSIS — M47816 Spondylosis without myelopathy or radiculopathy, lumbar region: Secondary | ICD-10-CM | POA: Diagnosis not present

## 2015-12-28 DIAGNOSIS — I4589 Other specified conduction disorders: Secondary | ICD-10-CM | POA: Diagnosis not present

## 2015-12-29 DIAGNOSIS — G309 Alzheimer's disease, unspecified: Secondary | ICD-10-CM | POA: Diagnosis not present

## 2015-12-29 DIAGNOSIS — R296 Repeated falls: Secondary | ICD-10-CM | POA: Diagnosis not present

## 2015-12-29 DIAGNOSIS — W19XXXA Unspecified fall, initial encounter: Secondary | ICD-10-CM | POA: Diagnosis not present

## 2015-12-29 DIAGNOSIS — F0280 Dementia in other diseases classified elsewhere without behavioral disturbance: Secondary | ICD-10-CM | POA: Diagnosis not present

## 2015-12-30 DIAGNOSIS — G309 Alzheimer's disease, unspecified: Secondary | ICD-10-CM | POA: Diagnosis not present

## 2015-12-30 DIAGNOSIS — F0280 Dementia in other diseases classified elsewhere without behavioral disturbance: Secondary | ICD-10-CM | POA: Diagnosis not present

## 2015-12-30 DIAGNOSIS — I10 Essential (primary) hypertension: Secondary | ICD-10-CM | POA: Diagnosis not present

## 2015-12-30 DIAGNOSIS — N2889 Other specified disorders of kidney and ureter: Secondary | ICD-10-CM | POA: Diagnosis not present

## 2015-12-31 DIAGNOSIS — F0280 Dementia in other diseases classified elsewhere without behavioral disturbance: Secondary | ICD-10-CM | POA: Diagnosis not present

## 2015-12-31 DIAGNOSIS — R296 Repeated falls: Secondary | ICD-10-CM | POA: Diagnosis not present

## 2015-12-31 DIAGNOSIS — N2889 Other specified disorders of kidney and ureter: Secondary | ICD-10-CM | POA: Diagnosis not present

## 2015-12-31 DIAGNOSIS — I10 Essential (primary) hypertension: Secondary | ICD-10-CM | POA: Diagnosis not present

## 2015-12-31 DIAGNOSIS — G309 Alzheimer's disease, unspecified: Secondary | ICD-10-CM | POA: Diagnosis not present

## 2016-01-01 DIAGNOSIS — R296 Repeated falls: Secondary | ICD-10-CM | POA: Diagnosis not present

## 2016-01-01 DIAGNOSIS — F0280 Dementia in other diseases classified elsewhere without behavioral disturbance: Secondary | ICD-10-CM | POA: Diagnosis not present

## 2016-01-01 DIAGNOSIS — I10 Essential (primary) hypertension: Secondary | ICD-10-CM | POA: Diagnosis not present

## 2016-01-01 DIAGNOSIS — G309 Alzheimer's disease, unspecified: Secondary | ICD-10-CM | POA: Diagnosis not present

## 2016-01-01 DIAGNOSIS — N2889 Other specified disorders of kidney and ureter: Secondary | ICD-10-CM | POA: Diagnosis not present

## 2016-01-02 DIAGNOSIS — I10 Essential (primary) hypertension: Secondary | ICD-10-CM | POA: Diagnosis not present

## 2016-01-02 DIAGNOSIS — G309 Alzheimer's disease, unspecified: Secondary | ICD-10-CM | POA: Diagnosis not present

## 2016-01-02 DIAGNOSIS — N2889 Other specified disorders of kidney and ureter: Secondary | ICD-10-CM | POA: Diagnosis not present

## 2016-01-02 DIAGNOSIS — F0280 Dementia in other diseases classified elsewhere without behavioral disturbance: Secondary | ICD-10-CM | POA: Diagnosis not present

## 2016-01-02 DIAGNOSIS — R296 Repeated falls: Secondary | ICD-10-CM | POA: Diagnosis not present

## 2016-01-03 DIAGNOSIS — C799 Secondary malignant neoplasm of unspecified site: Secondary | ICD-10-CM | POA: Diagnosis not present

## 2016-01-03 DIAGNOSIS — F0280 Dementia in other diseases classified elsewhere without behavioral disturbance: Secondary | ICD-10-CM | POA: Diagnosis not present

## 2016-01-03 DIAGNOSIS — I10 Essential (primary) hypertension: Secondary | ICD-10-CM | POA: Diagnosis not present

## 2016-01-03 DIAGNOSIS — G309 Alzheimer's disease, unspecified: Secondary | ICD-10-CM | POA: Diagnosis not present

## 2016-01-04 DIAGNOSIS — S32019A Unspecified fracture of first lumbar vertebra, initial encounter for closed fracture: Secondary | ICD-10-CM | POA: Diagnosis not present

## 2016-01-04 DIAGNOSIS — S32019D Unspecified fracture of first lumbar vertebra, subsequent encounter for fracture with routine healing: Secondary | ICD-10-CM | POA: Diagnosis not present

## 2016-01-04 DIAGNOSIS — E784 Other hyperlipidemia: Secondary | ICD-10-CM | POA: Diagnosis not present

## 2016-01-04 DIAGNOSIS — R102 Pelvic and perineal pain: Secondary | ICD-10-CM | POA: Diagnosis not present

## 2016-01-04 DIAGNOSIS — Z5189 Encounter for other specified aftercare: Secondary | ICD-10-CM | POA: Diagnosis not present

## 2016-01-04 DIAGNOSIS — R262 Difficulty in walking, not elsewhere classified: Secondary | ICD-10-CM | POA: Diagnosis not present

## 2016-01-04 DIAGNOSIS — E785 Hyperlipidemia, unspecified: Secondary | ICD-10-CM | POA: Diagnosis not present

## 2016-01-04 DIAGNOSIS — I2510 Atherosclerotic heart disease of native coronary artery without angina pectoris: Secondary | ICD-10-CM | POA: Diagnosis not present

## 2016-01-04 DIAGNOSIS — W19XXXA Unspecified fall, initial encounter: Secondary | ICD-10-CM | POA: Diagnosis not present

## 2016-01-04 DIAGNOSIS — F99 Mental disorder, not otherwise specified: Secondary | ICD-10-CM | POA: Diagnosis not present

## 2016-01-04 DIAGNOSIS — M6281 Muscle weakness (generalized): Secondary | ICD-10-CM | POA: Diagnosis not present

## 2016-01-04 DIAGNOSIS — Z515 Encounter for palliative care: Secondary | ICD-10-CM | POA: Diagnosis not present

## 2016-01-04 DIAGNOSIS — R5381 Other malaise: Secondary | ICD-10-CM | POA: Diagnosis not present

## 2016-01-04 DIAGNOSIS — G933 Postviral fatigue syndrome: Secondary | ICD-10-CM | POA: Diagnosis not present

## 2016-01-04 DIAGNOSIS — K449 Diaphragmatic hernia without obstruction or gangrene: Secondary | ICD-10-CM | POA: Diagnosis not present

## 2016-01-04 DIAGNOSIS — M85859 Other specified disorders of bone density and structure, unspecified thigh: Secondary | ICD-10-CM | POA: Diagnosis not present

## 2016-01-04 DIAGNOSIS — N390 Urinary tract infection, site not specified: Secondary | ICD-10-CM | POA: Diagnosis not present

## 2016-01-04 DIAGNOSIS — S129XXA Fracture of neck, unspecified, initial encounter: Secondary | ICD-10-CM | POA: Diagnosis not present

## 2016-01-04 DIAGNOSIS — G309 Alzheimer's disease, unspecified: Secondary | ICD-10-CM | POA: Diagnosis not present

## 2016-01-04 DIAGNOSIS — K7689 Other specified diseases of liver: Secondary | ICD-10-CM | POA: Diagnosis not present

## 2016-01-04 DIAGNOSIS — C642 Malignant neoplasm of left kidney, except renal pelvis: Secondary | ICD-10-CM | POA: Diagnosis not present

## 2016-01-04 DIAGNOSIS — F0280 Dementia in other diseases classified elsewhere without behavioral disturbance: Secondary | ICD-10-CM | POA: Diagnosis not present

## 2016-01-04 DIAGNOSIS — F0390 Unspecified dementia without behavioral disturbance: Secondary | ICD-10-CM | POA: Diagnosis not present

## 2016-01-04 DIAGNOSIS — I10 Essential (primary) hypertension: Secondary | ICD-10-CM | POA: Diagnosis not present

## 2016-01-04 DIAGNOSIS — M8580 Other specified disorders of bone density and structure, unspecified site: Secondary | ICD-10-CM | POA: Diagnosis not present

## 2016-01-04 DIAGNOSIS — R279 Unspecified lack of coordination: Secondary | ICD-10-CM | POA: Diagnosis not present

## 2016-01-04 DIAGNOSIS — S32010S Wedge compression fracture of first lumbar vertebra, sequela: Secondary | ICD-10-CM | POA: Diagnosis not present

## 2016-01-04 DIAGNOSIS — C649 Malignant neoplasm of unspecified kidney, except renal pelvis: Secondary | ICD-10-CM | POA: Diagnosis not present

## 2016-01-04 DIAGNOSIS — G308 Other Alzheimer's disease: Secondary | ICD-10-CM | POA: Diagnosis not present

## 2016-01-04 DIAGNOSIS — N2889 Other specified disorders of kidney and ureter: Secondary | ICD-10-CM | POA: Diagnosis not present

## 2016-01-08 DIAGNOSIS — F0390 Unspecified dementia without behavioral disturbance: Secondary | ICD-10-CM | POA: Diagnosis not present

## 2016-01-08 DIAGNOSIS — E784 Other hyperlipidemia: Secondary | ICD-10-CM | POA: Diagnosis not present

## 2016-01-08 DIAGNOSIS — I10 Essential (primary) hypertension: Secondary | ICD-10-CM | POA: Diagnosis not present

## 2016-01-08 DIAGNOSIS — G933 Postviral fatigue syndrome: Secondary | ICD-10-CM | POA: Diagnosis not present

## 2016-01-17 DIAGNOSIS — G933 Postviral fatigue syndrome: Secondary | ICD-10-CM | POA: Diagnosis not present

## 2016-01-17 DIAGNOSIS — E784 Other hyperlipidemia: Secondary | ICD-10-CM | POA: Diagnosis not present

## 2016-01-17 DIAGNOSIS — N390 Urinary tract infection, site not specified: Secondary | ICD-10-CM | POA: Diagnosis not present

## 2016-01-17 DIAGNOSIS — S129XXA Fracture of neck, unspecified, initial encounter: Secondary | ICD-10-CM | POA: Diagnosis not present

## 2016-01-17 DIAGNOSIS — F0390 Unspecified dementia without behavioral disturbance: Secondary | ICD-10-CM | POA: Diagnosis not present

## 2016-01-17 DIAGNOSIS — N2889 Other specified disorders of kidney and ureter: Secondary | ICD-10-CM | POA: Diagnosis not present

## 2016-01-17 DIAGNOSIS — I10 Essential (primary) hypertension: Secondary | ICD-10-CM | POA: Diagnosis not present

## 2016-01-25 DIAGNOSIS — G308 Other Alzheimer's disease: Secondary | ICD-10-CM | POA: Diagnosis not present

## 2016-01-25 DIAGNOSIS — F0390 Unspecified dementia without behavioral disturbance: Secondary | ICD-10-CM | POA: Diagnosis not present

## 2016-01-25 DIAGNOSIS — G933 Postviral fatigue syndrome: Secondary | ICD-10-CM | POA: Diagnosis not present

## 2016-01-25 DIAGNOSIS — I10 Essential (primary) hypertension: Secondary | ICD-10-CM | POA: Diagnosis not present

## 2016-01-25 DIAGNOSIS — E784 Other hyperlipidemia: Secondary | ICD-10-CM | POA: Diagnosis not present

## 2016-02-14 DIAGNOSIS — E559 Vitamin D deficiency, unspecified: Secondary | ICD-10-CM | POA: Diagnosis not present

## 2016-02-14 DIAGNOSIS — M8588 Other specified disorders of bone density and structure, other site: Secondary | ICD-10-CM | POA: Diagnosis not present

## 2016-02-14 DIAGNOSIS — M545 Low back pain: Secondary | ICD-10-CM | POA: Diagnosis not present

## 2016-02-14 DIAGNOSIS — I10 Essential (primary) hypertension: Secondary | ICD-10-CM | POA: Diagnosis not present

## 2016-02-14 DIAGNOSIS — E784 Other hyperlipidemia: Secondary | ICD-10-CM | POA: Diagnosis not present

## 2016-02-14 DIAGNOSIS — G308 Other Alzheimer's disease: Secondary | ICD-10-CM | POA: Diagnosis not present

## 2016-02-14 DIAGNOSIS — F0390 Unspecified dementia without behavioral disturbance: Secondary | ICD-10-CM | POA: Diagnosis not present

## 2016-02-22 DIAGNOSIS — E559 Vitamin D deficiency, unspecified: Secondary | ICD-10-CM | POA: Diagnosis not present

## 2016-02-29 DIAGNOSIS — M8588 Other specified disorders of bone density and structure, other site: Secondary | ICD-10-CM | POA: Diagnosis not present

## 2016-02-29 DIAGNOSIS — D4100 Neoplasm of uncertain behavior of unspecified kidney: Secondary | ICD-10-CM | POA: Diagnosis not present

## 2016-02-29 DIAGNOSIS — E784 Other hyperlipidemia: Secondary | ICD-10-CM | POA: Diagnosis not present

## 2016-02-29 DIAGNOSIS — M545 Low back pain: Secondary | ICD-10-CM | POA: Diagnosis not present

## 2016-02-29 DIAGNOSIS — G308 Other Alzheimer's disease: Secondary | ICD-10-CM | POA: Diagnosis not present

## 2016-03-29 DIAGNOSIS — G308 Other Alzheimer's disease: Secondary | ICD-10-CM | POA: Diagnosis not present

## 2016-03-29 DIAGNOSIS — D519 Vitamin B12 deficiency anemia, unspecified: Secondary | ICD-10-CM | POA: Diagnosis not present

## 2016-03-29 DIAGNOSIS — I10 Essential (primary) hypertension: Secondary | ICD-10-CM | POA: Diagnosis not present

## 2016-03-29 DIAGNOSIS — D381 Neoplasm of uncertain behavior of trachea, bronchus and lung: Secondary | ICD-10-CM | POA: Diagnosis not present

## 2016-03-29 DIAGNOSIS — E785 Hyperlipidemia, unspecified: Secondary | ICD-10-CM | POA: Diagnosis not present

## 2016-03-29 DIAGNOSIS — M545 Low back pain: Secondary | ICD-10-CM | POA: Diagnosis not present

## 2016-03-29 DIAGNOSIS — D4100 Neoplasm of uncertain behavior of unspecified kidney: Secondary | ICD-10-CM | POA: Diagnosis not present

## 2016-03-30 DIAGNOSIS — G309 Alzheimer's disease, unspecified: Secondary | ICD-10-CM | POA: Diagnosis not present

## 2016-03-30 DIAGNOSIS — R160 Hepatomegaly, not elsewhere classified: Secondary | ICD-10-CM | POA: Diagnosis not present

## 2016-04-03 DIAGNOSIS — S32010S Wedge compression fracture of first lumbar vertebra, sequela: Secondary | ICD-10-CM | POA: Diagnosis not present

## 2016-04-03 DIAGNOSIS — I10 Essential (primary) hypertension: Secondary | ICD-10-CM | POA: Diagnosis not present

## 2016-04-03 DIAGNOSIS — C642 Malignant neoplasm of left kidney, except renal pelvis: Secondary | ICD-10-CM | POA: Diagnosis not present

## 2016-04-03 DIAGNOSIS — G309 Alzheimer's disease, unspecified: Secondary | ICD-10-CM | POA: Diagnosis not present

## 2016-04-03 DIAGNOSIS — R1312 Dysphagia, oropharyngeal phase: Secondary | ICD-10-CM | POA: Diagnosis not present

## 2016-04-03 DIAGNOSIS — R41841 Cognitive communication deficit: Secondary | ICD-10-CM | POA: Diagnosis not present

## 2016-04-03 DIAGNOSIS — M6281 Muscle weakness (generalized): Secondary | ICD-10-CM | POA: Diagnosis not present

## 2016-04-03 DIAGNOSIS — E785 Hyperlipidemia, unspecified: Secondary | ICD-10-CM | POA: Diagnosis not present

## 2016-04-03 DIAGNOSIS — I2510 Atherosclerotic heart disease of native coronary artery without angina pectoris: Secondary | ICD-10-CM | POA: Diagnosis not present

## 2016-04-04 DIAGNOSIS — C642 Malignant neoplasm of left kidney, except renal pelvis: Secondary | ICD-10-CM | POA: Diagnosis not present

## 2016-04-04 DIAGNOSIS — G309 Alzheimer's disease, unspecified: Secondary | ICD-10-CM | POA: Diagnosis not present

## 2016-04-04 DIAGNOSIS — I10 Essential (primary) hypertension: Secondary | ICD-10-CM | POA: Diagnosis not present

## 2016-04-04 DIAGNOSIS — R1312 Dysphagia, oropharyngeal phase: Secondary | ICD-10-CM | POA: Diagnosis not present

## 2016-04-04 DIAGNOSIS — I2510 Atherosclerotic heart disease of native coronary artery without angina pectoris: Secondary | ICD-10-CM | POA: Diagnosis not present

## 2016-04-04 DIAGNOSIS — E785 Hyperlipidemia, unspecified: Secondary | ICD-10-CM | POA: Diagnosis not present

## 2016-04-04 DIAGNOSIS — S32010S Wedge compression fracture of first lumbar vertebra, sequela: Secondary | ICD-10-CM | POA: Diagnosis not present

## 2016-04-04 DIAGNOSIS — M6281 Muscle weakness (generalized): Secondary | ICD-10-CM | POA: Diagnosis not present

## 2016-04-04 DIAGNOSIS — R41841 Cognitive communication deficit: Secondary | ICD-10-CM | POA: Diagnosis not present

## 2016-04-05 DIAGNOSIS — I2510 Atherosclerotic heart disease of native coronary artery without angina pectoris: Secondary | ICD-10-CM | POA: Diagnosis not present

## 2016-04-05 DIAGNOSIS — G309 Alzheimer's disease, unspecified: Secondary | ICD-10-CM | POA: Diagnosis not present

## 2016-04-05 DIAGNOSIS — S32010S Wedge compression fracture of first lumbar vertebra, sequela: Secondary | ICD-10-CM | POA: Diagnosis not present

## 2016-04-05 DIAGNOSIS — E785 Hyperlipidemia, unspecified: Secondary | ICD-10-CM | POA: Diagnosis not present

## 2016-04-05 DIAGNOSIS — M6281 Muscle weakness (generalized): Secondary | ICD-10-CM | POA: Diagnosis not present

## 2016-04-05 DIAGNOSIS — R41841 Cognitive communication deficit: Secondary | ICD-10-CM | POA: Diagnosis not present

## 2016-04-05 DIAGNOSIS — I10 Essential (primary) hypertension: Secondary | ICD-10-CM | POA: Diagnosis not present

## 2016-04-05 DIAGNOSIS — C642 Malignant neoplasm of left kidney, except renal pelvis: Secondary | ICD-10-CM | POA: Diagnosis not present

## 2016-04-05 DIAGNOSIS — R1312 Dysphagia, oropharyngeal phase: Secondary | ICD-10-CM | POA: Diagnosis not present

## 2016-04-06 DIAGNOSIS — R1312 Dysphagia, oropharyngeal phase: Secondary | ICD-10-CM | POA: Diagnosis not present

## 2016-04-06 DIAGNOSIS — E785 Hyperlipidemia, unspecified: Secondary | ICD-10-CM | POA: Diagnosis not present

## 2016-04-06 DIAGNOSIS — I10 Essential (primary) hypertension: Secondary | ICD-10-CM | POA: Diagnosis not present

## 2016-04-06 DIAGNOSIS — I2510 Atherosclerotic heart disease of native coronary artery without angina pectoris: Secondary | ICD-10-CM | POA: Diagnosis not present

## 2016-04-06 DIAGNOSIS — R41841 Cognitive communication deficit: Secondary | ICD-10-CM | POA: Diagnosis not present

## 2016-04-06 DIAGNOSIS — S32010S Wedge compression fracture of first lumbar vertebra, sequela: Secondary | ICD-10-CM | POA: Diagnosis not present

## 2016-04-06 DIAGNOSIS — C642 Malignant neoplasm of left kidney, except renal pelvis: Secondary | ICD-10-CM | POA: Diagnosis not present

## 2016-04-06 DIAGNOSIS — G309 Alzheimer's disease, unspecified: Secondary | ICD-10-CM | POA: Diagnosis not present

## 2016-04-06 DIAGNOSIS — M6281 Muscle weakness (generalized): Secondary | ICD-10-CM | POA: Diagnosis not present

## 2016-04-09 DIAGNOSIS — I2510 Atherosclerotic heart disease of native coronary artery without angina pectoris: Secondary | ICD-10-CM | POA: Diagnosis not present

## 2016-04-09 DIAGNOSIS — S32010S Wedge compression fracture of first lumbar vertebra, sequela: Secondary | ICD-10-CM | POA: Diagnosis not present

## 2016-04-09 DIAGNOSIS — I10 Essential (primary) hypertension: Secondary | ICD-10-CM | POA: Diagnosis not present

## 2016-04-09 DIAGNOSIS — E785 Hyperlipidemia, unspecified: Secondary | ICD-10-CM | POA: Diagnosis not present

## 2016-04-09 DIAGNOSIS — G309 Alzheimer's disease, unspecified: Secondary | ICD-10-CM | POA: Diagnosis not present

## 2016-04-09 DIAGNOSIS — R1312 Dysphagia, oropharyngeal phase: Secondary | ICD-10-CM | POA: Diagnosis not present

## 2016-04-09 DIAGNOSIS — C642 Malignant neoplasm of left kidney, except renal pelvis: Secondary | ICD-10-CM | POA: Diagnosis not present

## 2016-04-09 DIAGNOSIS — R41841 Cognitive communication deficit: Secondary | ICD-10-CM | POA: Diagnosis not present

## 2016-04-09 DIAGNOSIS — M6281 Muscle weakness (generalized): Secondary | ICD-10-CM | POA: Diagnosis not present

## 2016-04-10 DIAGNOSIS — I10 Essential (primary) hypertension: Secondary | ICD-10-CM | POA: Diagnosis not present

## 2016-04-10 DIAGNOSIS — S32010S Wedge compression fracture of first lumbar vertebra, sequela: Secondary | ICD-10-CM | POA: Diagnosis not present

## 2016-04-10 DIAGNOSIS — R41841 Cognitive communication deficit: Secondary | ICD-10-CM | POA: Diagnosis not present

## 2016-04-10 DIAGNOSIS — E785 Hyperlipidemia, unspecified: Secondary | ICD-10-CM | POA: Diagnosis not present

## 2016-04-10 DIAGNOSIS — R1312 Dysphagia, oropharyngeal phase: Secondary | ICD-10-CM | POA: Diagnosis not present

## 2016-04-10 DIAGNOSIS — G309 Alzheimer's disease, unspecified: Secondary | ICD-10-CM | POA: Diagnosis not present

## 2016-04-10 DIAGNOSIS — I2510 Atherosclerotic heart disease of native coronary artery without angina pectoris: Secondary | ICD-10-CM | POA: Diagnosis not present

## 2016-04-10 DIAGNOSIS — C642 Malignant neoplasm of left kidney, except renal pelvis: Secondary | ICD-10-CM | POA: Diagnosis not present

## 2016-04-10 DIAGNOSIS — M6281 Muscle weakness (generalized): Secondary | ICD-10-CM | POA: Diagnosis not present

## 2016-04-11 DIAGNOSIS — R41841 Cognitive communication deficit: Secondary | ICD-10-CM | POA: Diagnosis not present

## 2016-04-11 DIAGNOSIS — I2510 Atherosclerotic heart disease of native coronary artery without angina pectoris: Secondary | ICD-10-CM | POA: Diagnosis not present

## 2016-04-11 DIAGNOSIS — C642 Malignant neoplasm of left kidney, except renal pelvis: Secondary | ICD-10-CM | POA: Diagnosis not present

## 2016-04-11 DIAGNOSIS — E785 Hyperlipidemia, unspecified: Secondary | ICD-10-CM | POA: Diagnosis not present

## 2016-04-11 DIAGNOSIS — I10 Essential (primary) hypertension: Secondary | ICD-10-CM | POA: Diagnosis not present

## 2016-04-11 DIAGNOSIS — R1312 Dysphagia, oropharyngeal phase: Secondary | ICD-10-CM | POA: Diagnosis not present

## 2016-04-11 DIAGNOSIS — M6281 Muscle weakness (generalized): Secondary | ICD-10-CM | POA: Diagnosis not present

## 2016-04-11 DIAGNOSIS — S32010S Wedge compression fracture of first lumbar vertebra, sequela: Secondary | ICD-10-CM | POA: Diagnosis not present

## 2016-04-11 DIAGNOSIS — G309 Alzheimer's disease, unspecified: Secondary | ICD-10-CM | POA: Diagnosis not present

## 2016-04-12 DIAGNOSIS — R1312 Dysphagia, oropharyngeal phase: Secondary | ICD-10-CM | POA: Diagnosis not present

## 2016-04-12 DIAGNOSIS — M6281 Muscle weakness (generalized): Secondary | ICD-10-CM | POA: Diagnosis not present

## 2016-04-12 DIAGNOSIS — R41841 Cognitive communication deficit: Secondary | ICD-10-CM | POA: Diagnosis not present

## 2016-04-12 DIAGNOSIS — I2510 Atherosclerotic heart disease of native coronary artery without angina pectoris: Secondary | ICD-10-CM | POA: Diagnosis not present

## 2016-04-12 DIAGNOSIS — G309 Alzheimer's disease, unspecified: Secondary | ICD-10-CM | POA: Diagnosis not present

## 2016-04-12 DIAGNOSIS — S32010S Wedge compression fracture of first lumbar vertebra, sequela: Secondary | ICD-10-CM | POA: Diagnosis not present

## 2016-04-12 DIAGNOSIS — C642 Malignant neoplasm of left kidney, except renal pelvis: Secondary | ICD-10-CM | POA: Diagnosis not present

## 2016-04-12 DIAGNOSIS — E785 Hyperlipidemia, unspecified: Secondary | ICD-10-CM | POA: Diagnosis not present

## 2016-04-12 DIAGNOSIS — I10 Essential (primary) hypertension: Secondary | ICD-10-CM | POA: Diagnosis not present

## 2016-04-13 DIAGNOSIS — I2510 Atherosclerotic heart disease of native coronary artery without angina pectoris: Secondary | ICD-10-CM | POA: Diagnosis not present

## 2016-04-13 DIAGNOSIS — M6281 Muscle weakness (generalized): Secondary | ICD-10-CM | POA: Diagnosis not present

## 2016-04-13 DIAGNOSIS — I10 Essential (primary) hypertension: Secondary | ICD-10-CM | POA: Diagnosis not present

## 2016-04-13 DIAGNOSIS — G309 Alzheimer's disease, unspecified: Secondary | ICD-10-CM | POA: Diagnosis not present

## 2016-04-13 DIAGNOSIS — E785 Hyperlipidemia, unspecified: Secondary | ICD-10-CM | POA: Diagnosis not present

## 2016-04-13 DIAGNOSIS — R41841 Cognitive communication deficit: Secondary | ICD-10-CM | POA: Diagnosis not present

## 2016-04-13 DIAGNOSIS — C642 Malignant neoplasm of left kidney, except renal pelvis: Secondary | ICD-10-CM | POA: Diagnosis not present

## 2016-04-13 DIAGNOSIS — S32010S Wedge compression fracture of first lumbar vertebra, sequela: Secondary | ICD-10-CM | POA: Diagnosis not present

## 2016-04-13 DIAGNOSIS — R1312 Dysphagia, oropharyngeal phase: Secondary | ICD-10-CM | POA: Diagnosis not present

## 2016-04-14 DIAGNOSIS — E785 Hyperlipidemia, unspecified: Secondary | ICD-10-CM | POA: Diagnosis not present

## 2016-04-14 DIAGNOSIS — C642 Malignant neoplasm of left kidney, except renal pelvis: Secondary | ICD-10-CM | POA: Diagnosis not present

## 2016-04-14 DIAGNOSIS — I2510 Atherosclerotic heart disease of native coronary artery without angina pectoris: Secondary | ICD-10-CM | POA: Diagnosis not present

## 2016-04-14 DIAGNOSIS — R41841 Cognitive communication deficit: Secondary | ICD-10-CM | POA: Diagnosis not present

## 2016-04-14 DIAGNOSIS — R1312 Dysphagia, oropharyngeal phase: Secondary | ICD-10-CM | POA: Diagnosis not present

## 2016-04-14 DIAGNOSIS — S32010S Wedge compression fracture of first lumbar vertebra, sequela: Secondary | ICD-10-CM | POA: Diagnosis not present

## 2016-04-14 DIAGNOSIS — I10 Essential (primary) hypertension: Secondary | ICD-10-CM | POA: Diagnosis not present

## 2016-04-14 DIAGNOSIS — M6281 Muscle weakness (generalized): Secondary | ICD-10-CM | POA: Diagnosis not present

## 2016-04-14 DIAGNOSIS — G309 Alzheimer's disease, unspecified: Secondary | ICD-10-CM | POA: Diagnosis not present

## 2016-04-16 DIAGNOSIS — I2510 Atherosclerotic heart disease of native coronary artery without angina pectoris: Secondary | ICD-10-CM | POA: Diagnosis not present

## 2016-04-16 DIAGNOSIS — G309 Alzheimer's disease, unspecified: Secondary | ICD-10-CM | POA: Diagnosis not present

## 2016-04-16 DIAGNOSIS — I10 Essential (primary) hypertension: Secondary | ICD-10-CM | POA: Diagnosis not present

## 2016-04-16 DIAGNOSIS — M6281 Muscle weakness (generalized): Secondary | ICD-10-CM | POA: Diagnosis not present

## 2016-04-16 DIAGNOSIS — R41841 Cognitive communication deficit: Secondary | ICD-10-CM | POA: Diagnosis not present

## 2016-04-16 DIAGNOSIS — C642 Malignant neoplasm of left kidney, except renal pelvis: Secondary | ICD-10-CM | POA: Diagnosis not present

## 2016-04-16 DIAGNOSIS — E785 Hyperlipidemia, unspecified: Secondary | ICD-10-CM | POA: Diagnosis not present

## 2016-04-16 DIAGNOSIS — R1312 Dysphagia, oropharyngeal phase: Secondary | ICD-10-CM | POA: Diagnosis not present

## 2016-04-16 DIAGNOSIS — S32010S Wedge compression fracture of first lumbar vertebra, sequela: Secondary | ICD-10-CM | POA: Diagnosis not present

## 2016-04-17 DIAGNOSIS — M6281 Muscle weakness (generalized): Secondary | ICD-10-CM | POA: Diagnosis not present

## 2016-04-17 DIAGNOSIS — I10 Essential (primary) hypertension: Secondary | ICD-10-CM | POA: Diagnosis not present

## 2016-04-17 DIAGNOSIS — R41841 Cognitive communication deficit: Secondary | ICD-10-CM | POA: Diagnosis not present

## 2016-04-17 DIAGNOSIS — I2510 Atherosclerotic heart disease of native coronary artery without angina pectoris: Secondary | ICD-10-CM | POA: Diagnosis not present

## 2016-04-17 DIAGNOSIS — C642 Malignant neoplasm of left kidney, except renal pelvis: Secondary | ICD-10-CM | POA: Diagnosis not present

## 2016-04-17 DIAGNOSIS — E785 Hyperlipidemia, unspecified: Secondary | ICD-10-CM | POA: Diagnosis not present

## 2016-04-17 DIAGNOSIS — S32010S Wedge compression fracture of first lumbar vertebra, sequela: Secondary | ICD-10-CM | POA: Diagnosis not present

## 2016-04-17 DIAGNOSIS — G309 Alzheimer's disease, unspecified: Secondary | ICD-10-CM | POA: Diagnosis not present

## 2016-04-17 DIAGNOSIS — R1312 Dysphagia, oropharyngeal phase: Secondary | ICD-10-CM | POA: Diagnosis not present

## 2016-04-18 DIAGNOSIS — S32010S Wedge compression fracture of first lumbar vertebra, sequela: Secondary | ICD-10-CM | POA: Diagnosis not present

## 2016-04-18 DIAGNOSIS — R1312 Dysphagia, oropharyngeal phase: Secondary | ICD-10-CM | POA: Diagnosis not present

## 2016-04-18 DIAGNOSIS — G309 Alzheimer's disease, unspecified: Secondary | ICD-10-CM | POA: Diagnosis not present

## 2016-04-18 DIAGNOSIS — R41841 Cognitive communication deficit: Secondary | ICD-10-CM | POA: Diagnosis not present

## 2016-04-18 DIAGNOSIS — I2510 Atherosclerotic heart disease of native coronary artery without angina pectoris: Secondary | ICD-10-CM | POA: Diagnosis not present

## 2016-04-18 DIAGNOSIS — C642 Malignant neoplasm of left kidney, except renal pelvis: Secondary | ICD-10-CM | POA: Diagnosis not present

## 2016-04-18 DIAGNOSIS — E785 Hyperlipidemia, unspecified: Secondary | ICD-10-CM | POA: Diagnosis not present

## 2016-04-18 DIAGNOSIS — I10 Essential (primary) hypertension: Secondary | ICD-10-CM | POA: Diagnosis not present

## 2016-04-18 DIAGNOSIS — M6281 Muscle weakness (generalized): Secondary | ICD-10-CM | POA: Diagnosis not present

## 2016-04-19 DIAGNOSIS — C642 Malignant neoplasm of left kidney, except renal pelvis: Secondary | ICD-10-CM | POA: Diagnosis not present

## 2016-04-19 DIAGNOSIS — S32010S Wedge compression fracture of first lumbar vertebra, sequela: Secondary | ICD-10-CM | POA: Diagnosis not present

## 2016-04-19 DIAGNOSIS — E785 Hyperlipidemia, unspecified: Secondary | ICD-10-CM | POA: Diagnosis not present

## 2016-04-19 DIAGNOSIS — I2510 Atherosclerotic heart disease of native coronary artery without angina pectoris: Secondary | ICD-10-CM | POA: Diagnosis not present

## 2016-04-19 DIAGNOSIS — R41841 Cognitive communication deficit: Secondary | ICD-10-CM | POA: Diagnosis not present

## 2016-04-19 DIAGNOSIS — M6281 Muscle weakness (generalized): Secondary | ICD-10-CM | POA: Diagnosis not present

## 2016-04-19 DIAGNOSIS — R1312 Dysphagia, oropharyngeal phase: Secondary | ICD-10-CM | POA: Diagnosis not present

## 2016-04-19 DIAGNOSIS — I10 Essential (primary) hypertension: Secondary | ICD-10-CM | POA: Diagnosis not present

## 2016-04-19 DIAGNOSIS — G309 Alzheimer's disease, unspecified: Secondary | ICD-10-CM | POA: Diagnosis not present

## 2016-04-20 DIAGNOSIS — S32010S Wedge compression fracture of first lumbar vertebra, sequela: Secondary | ICD-10-CM | POA: Diagnosis not present

## 2016-04-20 DIAGNOSIS — I10 Essential (primary) hypertension: Secondary | ICD-10-CM | POA: Diagnosis not present

## 2016-04-20 DIAGNOSIS — R1312 Dysphagia, oropharyngeal phase: Secondary | ICD-10-CM | POA: Diagnosis not present

## 2016-04-20 DIAGNOSIS — I2510 Atherosclerotic heart disease of native coronary artery without angina pectoris: Secondary | ICD-10-CM | POA: Diagnosis not present

## 2016-04-20 DIAGNOSIS — G309 Alzheimer's disease, unspecified: Secondary | ICD-10-CM | POA: Diagnosis not present

## 2016-04-20 DIAGNOSIS — C642 Malignant neoplasm of left kidney, except renal pelvis: Secondary | ICD-10-CM | POA: Diagnosis not present

## 2016-04-20 DIAGNOSIS — E785 Hyperlipidemia, unspecified: Secondary | ICD-10-CM | POA: Diagnosis not present

## 2016-04-20 DIAGNOSIS — M6281 Muscle weakness (generalized): Secondary | ICD-10-CM | POA: Diagnosis not present

## 2016-04-20 DIAGNOSIS — R41841 Cognitive communication deficit: Secondary | ICD-10-CM | POA: Diagnosis not present

## 2016-04-23 DIAGNOSIS — G309 Alzheimer's disease, unspecified: Secondary | ICD-10-CM | POA: Diagnosis not present

## 2016-04-23 DIAGNOSIS — R1312 Dysphagia, oropharyngeal phase: Secondary | ICD-10-CM | POA: Diagnosis not present

## 2016-04-23 DIAGNOSIS — C642 Malignant neoplasm of left kidney, except renal pelvis: Secondary | ICD-10-CM | POA: Diagnosis not present

## 2016-04-23 DIAGNOSIS — S32010S Wedge compression fracture of first lumbar vertebra, sequela: Secondary | ICD-10-CM | POA: Diagnosis not present

## 2016-04-23 DIAGNOSIS — I10 Essential (primary) hypertension: Secondary | ICD-10-CM | POA: Diagnosis not present

## 2016-04-23 DIAGNOSIS — R41841 Cognitive communication deficit: Secondary | ICD-10-CM | POA: Diagnosis not present

## 2016-04-23 DIAGNOSIS — M6281 Muscle weakness (generalized): Secondary | ICD-10-CM | POA: Diagnosis not present

## 2016-04-23 DIAGNOSIS — E785 Hyperlipidemia, unspecified: Secondary | ICD-10-CM | POA: Diagnosis not present

## 2016-04-23 DIAGNOSIS — I2510 Atherosclerotic heart disease of native coronary artery without angina pectoris: Secondary | ICD-10-CM | POA: Diagnosis not present

## 2016-04-24 DIAGNOSIS — R1312 Dysphagia, oropharyngeal phase: Secondary | ICD-10-CM | POA: Diagnosis not present

## 2016-04-24 DIAGNOSIS — M6281 Muscle weakness (generalized): Secondary | ICD-10-CM | POA: Diagnosis not present

## 2016-04-24 DIAGNOSIS — I10 Essential (primary) hypertension: Secondary | ICD-10-CM | POA: Diagnosis not present

## 2016-04-24 DIAGNOSIS — C642 Malignant neoplasm of left kidney, except renal pelvis: Secondary | ICD-10-CM | POA: Diagnosis not present

## 2016-04-24 DIAGNOSIS — S32010S Wedge compression fracture of first lumbar vertebra, sequela: Secondary | ICD-10-CM | POA: Diagnosis not present

## 2016-04-24 DIAGNOSIS — R41841 Cognitive communication deficit: Secondary | ICD-10-CM | POA: Diagnosis not present

## 2016-04-24 DIAGNOSIS — E785 Hyperlipidemia, unspecified: Secondary | ICD-10-CM | POA: Diagnosis not present

## 2016-04-24 DIAGNOSIS — G309 Alzheimer's disease, unspecified: Secondary | ICD-10-CM | POA: Diagnosis not present

## 2016-04-24 DIAGNOSIS — I2510 Atherosclerotic heart disease of native coronary artery without angina pectoris: Secondary | ICD-10-CM | POA: Diagnosis not present

## 2016-04-25 DIAGNOSIS — S32010S Wedge compression fracture of first lumbar vertebra, sequela: Secondary | ICD-10-CM | POA: Diagnosis not present

## 2016-04-25 DIAGNOSIS — G309 Alzheimer's disease, unspecified: Secondary | ICD-10-CM | POA: Diagnosis not present

## 2016-04-25 DIAGNOSIS — C642 Malignant neoplasm of left kidney, except renal pelvis: Secondary | ICD-10-CM | POA: Diagnosis not present

## 2016-04-25 DIAGNOSIS — R41841 Cognitive communication deficit: Secondary | ICD-10-CM | POA: Diagnosis not present

## 2016-04-25 DIAGNOSIS — R1312 Dysphagia, oropharyngeal phase: Secondary | ICD-10-CM | POA: Diagnosis not present

## 2016-04-25 DIAGNOSIS — E785 Hyperlipidemia, unspecified: Secondary | ICD-10-CM | POA: Diagnosis not present

## 2016-04-25 DIAGNOSIS — M6281 Muscle weakness (generalized): Secondary | ICD-10-CM | POA: Diagnosis not present

## 2016-04-25 DIAGNOSIS — I2510 Atherosclerotic heart disease of native coronary artery without angina pectoris: Secondary | ICD-10-CM | POA: Diagnosis not present

## 2016-04-25 DIAGNOSIS — I10 Essential (primary) hypertension: Secondary | ICD-10-CM | POA: Diagnosis not present

## 2016-04-26 DIAGNOSIS — E785 Hyperlipidemia, unspecified: Secondary | ICD-10-CM | POA: Diagnosis not present

## 2016-04-26 DIAGNOSIS — C642 Malignant neoplasm of left kidney, except renal pelvis: Secondary | ICD-10-CM | POA: Diagnosis not present

## 2016-04-26 DIAGNOSIS — I10 Essential (primary) hypertension: Secondary | ICD-10-CM | POA: Diagnosis not present

## 2016-04-26 DIAGNOSIS — S32010S Wedge compression fracture of first lumbar vertebra, sequela: Secondary | ICD-10-CM | POA: Diagnosis not present

## 2016-04-26 DIAGNOSIS — M6281 Muscle weakness (generalized): Secondary | ICD-10-CM | POA: Diagnosis not present

## 2016-04-26 DIAGNOSIS — G309 Alzheimer's disease, unspecified: Secondary | ICD-10-CM | POA: Diagnosis not present

## 2016-04-26 DIAGNOSIS — R1312 Dysphagia, oropharyngeal phase: Secondary | ICD-10-CM | POA: Diagnosis not present

## 2016-04-26 DIAGNOSIS — I2510 Atherosclerotic heart disease of native coronary artery without angina pectoris: Secondary | ICD-10-CM | POA: Diagnosis not present

## 2016-04-26 DIAGNOSIS — R41841 Cognitive communication deficit: Secondary | ICD-10-CM | POA: Diagnosis not present

## 2016-04-27 DIAGNOSIS — M6281 Muscle weakness (generalized): Secondary | ICD-10-CM | POA: Diagnosis not present

## 2016-04-27 DIAGNOSIS — I2510 Atherosclerotic heart disease of native coronary artery without angina pectoris: Secondary | ICD-10-CM | POA: Diagnosis not present

## 2016-04-27 DIAGNOSIS — R41841 Cognitive communication deficit: Secondary | ICD-10-CM | POA: Diagnosis not present

## 2016-04-27 DIAGNOSIS — I10 Essential (primary) hypertension: Secondary | ICD-10-CM | POA: Diagnosis not present

## 2016-04-27 DIAGNOSIS — S32010S Wedge compression fracture of first lumbar vertebra, sequela: Secondary | ICD-10-CM | POA: Diagnosis not present

## 2016-04-27 DIAGNOSIS — G309 Alzheimer's disease, unspecified: Secondary | ICD-10-CM | POA: Diagnosis not present

## 2016-04-27 DIAGNOSIS — C642 Malignant neoplasm of left kidney, except renal pelvis: Secondary | ICD-10-CM | POA: Diagnosis not present

## 2016-04-27 DIAGNOSIS — E785 Hyperlipidemia, unspecified: Secondary | ICD-10-CM | POA: Diagnosis not present

## 2016-04-27 DIAGNOSIS — R1312 Dysphagia, oropharyngeal phase: Secondary | ICD-10-CM | POA: Diagnosis not present

## 2016-04-30 DIAGNOSIS — C642 Malignant neoplasm of left kidney, except renal pelvis: Secondary | ICD-10-CM | POA: Diagnosis not present

## 2016-04-30 DIAGNOSIS — G309 Alzheimer's disease, unspecified: Secondary | ICD-10-CM | POA: Diagnosis not present

## 2016-04-30 DIAGNOSIS — R41841 Cognitive communication deficit: Secondary | ICD-10-CM | POA: Diagnosis not present

## 2016-04-30 DIAGNOSIS — R1312 Dysphagia, oropharyngeal phase: Secondary | ICD-10-CM | POA: Diagnosis not present

## 2016-04-30 DIAGNOSIS — S32010S Wedge compression fracture of first lumbar vertebra, sequela: Secondary | ICD-10-CM | POA: Diagnosis not present

## 2016-04-30 DIAGNOSIS — M6281 Muscle weakness (generalized): Secondary | ICD-10-CM | POA: Diagnosis not present

## 2016-04-30 DIAGNOSIS — E785 Hyperlipidemia, unspecified: Secondary | ICD-10-CM | POA: Diagnosis not present

## 2016-04-30 DIAGNOSIS — I2510 Atherosclerotic heart disease of native coronary artery without angina pectoris: Secondary | ICD-10-CM | POA: Diagnosis not present

## 2016-04-30 DIAGNOSIS — I10 Essential (primary) hypertension: Secondary | ICD-10-CM | POA: Diagnosis not present

## 2016-05-01 DIAGNOSIS — R41841 Cognitive communication deficit: Secondary | ICD-10-CM | POA: Diagnosis not present

## 2016-05-01 DIAGNOSIS — M6281 Muscle weakness (generalized): Secondary | ICD-10-CM | POA: Diagnosis not present

## 2016-05-01 DIAGNOSIS — G309 Alzheimer's disease, unspecified: Secondary | ICD-10-CM | POA: Diagnosis not present

## 2016-05-01 DIAGNOSIS — R1312 Dysphagia, oropharyngeal phase: Secondary | ICD-10-CM | POA: Diagnosis not present

## 2016-05-01 DIAGNOSIS — C642 Malignant neoplasm of left kidney, except renal pelvis: Secondary | ICD-10-CM | POA: Diagnosis not present

## 2016-05-01 DIAGNOSIS — S32010S Wedge compression fracture of first lumbar vertebra, sequela: Secondary | ICD-10-CM | POA: Diagnosis not present

## 2016-05-01 DIAGNOSIS — I10 Essential (primary) hypertension: Secondary | ICD-10-CM | POA: Diagnosis not present

## 2016-05-01 DIAGNOSIS — I2510 Atherosclerotic heart disease of native coronary artery without angina pectoris: Secondary | ICD-10-CM | POA: Diagnosis not present

## 2016-05-01 DIAGNOSIS — E785 Hyperlipidemia, unspecified: Secondary | ICD-10-CM | POA: Diagnosis not present

## 2016-05-02 DIAGNOSIS — M6281 Muscle weakness (generalized): Secondary | ICD-10-CM | POA: Diagnosis not present

## 2016-05-02 DIAGNOSIS — R41841 Cognitive communication deficit: Secondary | ICD-10-CM | POA: Diagnosis not present

## 2016-05-02 DIAGNOSIS — C642 Malignant neoplasm of left kidney, except renal pelvis: Secondary | ICD-10-CM | POA: Diagnosis not present

## 2016-05-02 DIAGNOSIS — E785 Hyperlipidemia, unspecified: Secondary | ICD-10-CM | POA: Diagnosis not present

## 2016-05-02 DIAGNOSIS — R1312 Dysphagia, oropharyngeal phase: Secondary | ICD-10-CM | POA: Diagnosis not present

## 2016-05-02 DIAGNOSIS — I10 Essential (primary) hypertension: Secondary | ICD-10-CM | POA: Diagnosis not present

## 2016-05-02 DIAGNOSIS — G309 Alzheimer's disease, unspecified: Secondary | ICD-10-CM | POA: Diagnosis not present

## 2016-05-02 DIAGNOSIS — I2510 Atherosclerotic heart disease of native coronary artery without angina pectoris: Secondary | ICD-10-CM | POA: Diagnosis not present

## 2016-05-02 DIAGNOSIS — S32010S Wedge compression fracture of first lumbar vertebra, sequela: Secondary | ICD-10-CM | POA: Diagnosis not present

## 2016-05-03 DIAGNOSIS — R1312 Dysphagia, oropharyngeal phase: Secondary | ICD-10-CM | POA: Diagnosis not present

## 2016-05-03 DIAGNOSIS — I2510 Atherosclerotic heart disease of native coronary artery without angina pectoris: Secondary | ICD-10-CM | POA: Diagnosis not present

## 2016-05-03 DIAGNOSIS — G309 Alzheimer's disease, unspecified: Secondary | ICD-10-CM | POA: Diagnosis not present

## 2016-05-03 DIAGNOSIS — S32010S Wedge compression fracture of first lumbar vertebra, sequela: Secondary | ICD-10-CM | POA: Diagnosis not present

## 2016-05-03 DIAGNOSIS — I10 Essential (primary) hypertension: Secondary | ICD-10-CM | POA: Diagnosis not present

## 2016-05-03 DIAGNOSIS — E785 Hyperlipidemia, unspecified: Secondary | ICD-10-CM | POA: Diagnosis not present

## 2016-05-03 DIAGNOSIS — R41841 Cognitive communication deficit: Secondary | ICD-10-CM | POA: Diagnosis not present

## 2016-05-03 DIAGNOSIS — C642 Malignant neoplasm of left kidney, except renal pelvis: Secondary | ICD-10-CM | POA: Diagnosis not present

## 2016-05-03 DIAGNOSIS — M6281 Muscle weakness (generalized): Secondary | ICD-10-CM | POA: Diagnosis not present

## 2016-05-04 DIAGNOSIS — S32010S Wedge compression fracture of first lumbar vertebra, sequela: Secondary | ICD-10-CM | POA: Diagnosis not present

## 2016-05-04 DIAGNOSIS — E785 Hyperlipidemia, unspecified: Secondary | ICD-10-CM | POA: Diagnosis not present

## 2016-05-04 DIAGNOSIS — R41841 Cognitive communication deficit: Secondary | ICD-10-CM | POA: Diagnosis not present

## 2016-05-04 DIAGNOSIS — R1312 Dysphagia, oropharyngeal phase: Secondary | ICD-10-CM | POA: Diagnosis not present

## 2016-05-04 DIAGNOSIS — I2510 Atherosclerotic heart disease of native coronary artery without angina pectoris: Secondary | ICD-10-CM | POA: Diagnosis not present

## 2016-05-04 DIAGNOSIS — C642 Malignant neoplasm of left kidney, except renal pelvis: Secondary | ICD-10-CM | POA: Diagnosis not present

## 2016-05-04 DIAGNOSIS — G309 Alzheimer's disease, unspecified: Secondary | ICD-10-CM | POA: Diagnosis not present

## 2016-05-04 DIAGNOSIS — M6281 Muscle weakness (generalized): Secondary | ICD-10-CM | POA: Diagnosis not present

## 2016-05-04 DIAGNOSIS — I10 Essential (primary) hypertension: Secondary | ICD-10-CM | POA: Diagnosis not present

## 2016-05-07 DIAGNOSIS — M6281 Muscle weakness (generalized): Secondary | ICD-10-CM | POA: Diagnosis not present

## 2016-05-07 DIAGNOSIS — I10 Essential (primary) hypertension: Secondary | ICD-10-CM | POA: Diagnosis not present

## 2016-05-07 DIAGNOSIS — R1312 Dysphagia, oropharyngeal phase: Secondary | ICD-10-CM | POA: Diagnosis not present

## 2016-05-07 DIAGNOSIS — I2510 Atherosclerotic heart disease of native coronary artery without angina pectoris: Secondary | ICD-10-CM | POA: Diagnosis not present

## 2016-05-07 DIAGNOSIS — S32010S Wedge compression fracture of first lumbar vertebra, sequela: Secondary | ICD-10-CM | POA: Diagnosis not present

## 2016-05-07 DIAGNOSIS — E785 Hyperlipidemia, unspecified: Secondary | ICD-10-CM | POA: Diagnosis not present

## 2016-05-07 DIAGNOSIS — C642 Malignant neoplasm of left kidney, except renal pelvis: Secondary | ICD-10-CM | POA: Diagnosis not present

## 2016-05-07 DIAGNOSIS — R41841 Cognitive communication deficit: Secondary | ICD-10-CM | POA: Diagnosis not present

## 2016-05-07 DIAGNOSIS — G309 Alzheimer's disease, unspecified: Secondary | ICD-10-CM | POA: Diagnosis not present

## 2016-05-08 DIAGNOSIS — G309 Alzheimer's disease, unspecified: Secondary | ICD-10-CM | POA: Diagnosis not present

## 2016-05-08 DIAGNOSIS — E785 Hyperlipidemia, unspecified: Secondary | ICD-10-CM | POA: Diagnosis not present

## 2016-05-08 DIAGNOSIS — M6281 Muscle weakness (generalized): Secondary | ICD-10-CM | POA: Diagnosis not present

## 2016-05-08 DIAGNOSIS — I10 Essential (primary) hypertension: Secondary | ICD-10-CM | POA: Diagnosis not present

## 2016-05-08 DIAGNOSIS — C642 Malignant neoplasm of left kidney, except renal pelvis: Secondary | ICD-10-CM | POA: Diagnosis not present

## 2016-05-08 DIAGNOSIS — S32010S Wedge compression fracture of first lumbar vertebra, sequela: Secondary | ICD-10-CM | POA: Diagnosis not present

## 2016-05-08 DIAGNOSIS — I2510 Atherosclerotic heart disease of native coronary artery without angina pectoris: Secondary | ICD-10-CM | POA: Diagnosis not present

## 2016-05-08 DIAGNOSIS — R41841 Cognitive communication deficit: Secondary | ICD-10-CM | POA: Diagnosis not present

## 2016-05-08 DIAGNOSIS — R1312 Dysphagia, oropharyngeal phase: Secondary | ICD-10-CM | POA: Diagnosis not present

## 2016-05-09 DIAGNOSIS — R41841 Cognitive communication deficit: Secondary | ICD-10-CM | POA: Diagnosis not present

## 2016-05-09 DIAGNOSIS — I10 Essential (primary) hypertension: Secondary | ICD-10-CM | POA: Diagnosis not present

## 2016-05-09 DIAGNOSIS — R1312 Dysphagia, oropharyngeal phase: Secondary | ICD-10-CM | POA: Diagnosis not present

## 2016-05-09 DIAGNOSIS — M6281 Muscle weakness (generalized): Secondary | ICD-10-CM | POA: Diagnosis not present

## 2016-05-09 DIAGNOSIS — S32010S Wedge compression fracture of first lumbar vertebra, sequela: Secondary | ICD-10-CM | POA: Diagnosis not present

## 2016-05-09 DIAGNOSIS — G309 Alzheimer's disease, unspecified: Secondary | ICD-10-CM | POA: Diagnosis not present

## 2016-05-09 DIAGNOSIS — E785 Hyperlipidemia, unspecified: Secondary | ICD-10-CM | POA: Diagnosis not present

## 2016-05-09 DIAGNOSIS — I2510 Atherosclerotic heart disease of native coronary artery without angina pectoris: Secondary | ICD-10-CM | POA: Diagnosis not present

## 2016-05-09 DIAGNOSIS — C642 Malignant neoplasm of left kidney, except renal pelvis: Secondary | ICD-10-CM | POA: Diagnosis not present

## 2016-05-10 ENCOUNTER — Inpatient Hospital Stay
Admission: EM | Admit: 2016-05-10 | Discharge: 2016-05-12 | DRG: 871 | Disposition: A | Discharge status: Skilled Nursing Facility | Payer: Medicare PPO | Attending: Internal Medicine | Admitting: Internal Medicine

## 2016-05-10 ENCOUNTER — Encounter: Payer: Self-pay | Admitting: Emergency Medicine

## 2016-05-10 ENCOUNTER — Emergency Department: Payer: Medicare PPO

## 2016-05-10 DIAGNOSIS — G9340 Encephalopathy, unspecified: Secondary | ICD-10-CM | POA: Diagnosis present

## 2016-05-10 DIAGNOSIS — N390 Urinary tract infection, site not specified: Secondary | ICD-10-CM

## 2016-05-10 DIAGNOSIS — Z7401 Bed confinement status: Secondary | ICD-10-CM | POA: Diagnosis not present

## 2016-05-10 DIAGNOSIS — Z681 Body mass index (BMI) 19 or less, adult: Secondary | ICD-10-CM | POA: Diagnosis not present

## 2016-05-10 DIAGNOSIS — R400 Somnolence: Secondary | ICD-10-CM | POA: Diagnosis not present

## 2016-05-10 DIAGNOSIS — Z720 Tobacco use: Secondary | ICD-10-CM

## 2016-05-10 DIAGNOSIS — Z79899 Other long term (current) drug therapy: Secondary | ICD-10-CM

## 2016-05-10 DIAGNOSIS — Z993 Dependence on wheelchair: Secondary | ICD-10-CM

## 2016-05-10 DIAGNOSIS — E785 Hyperlipidemia, unspecified: Secondary | ICD-10-CM | POA: Diagnosis present

## 2016-05-10 DIAGNOSIS — F0390 Unspecified dementia without behavioral disturbance: Secondary | ICD-10-CM | POA: Diagnosis not present

## 2016-05-10 DIAGNOSIS — E43 Unspecified severe protein-calorie malnutrition: Secondary | ICD-10-CM | POA: Diagnosis not present

## 2016-05-10 DIAGNOSIS — I6782 Cerebral ischemia: Secondary | ICD-10-CM | POA: Diagnosis not present

## 2016-05-10 DIAGNOSIS — A419 Sepsis, unspecified organism: Principal | ICD-10-CM | POA: Diagnosis present

## 2016-05-10 DIAGNOSIS — Z90710 Acquired absence of both cervix and uterus: Secondary | ICD-10-CM

## 2016-05-10 DIAGNOSIS — B964 Proteus (mirabilis) (morganii) as the cause of diseases classified elsewhere: Secondary | ICD-10-CM | POA: Diagnosis present

## 2016-05-10 DIAGNOSIS — Z66 Do not resuscitate: Secondary | ICD-10-CM | POA: Diagnosis present

## 2016-05-10 DIAGNOSIS — I10 Essential (primary) hypertension: Secondary | ICD-10-CM | POA: Diagnosis not present

## 2016-05-10 DIAGNOSIS — Z7982 Long term (current) use of aspirin: Secondary | ICD-10-CM

## 2016-05-10 DIAGNOSIS — G9341 Metabolic encephalopathy: Secondary | ICD-10-CM | POA: Diagnosis present

## 2016-05-10 DIAGNOSIS — R410 Disorientation, unspecified: Secondary | ICD-10-CM | POA: Diagnosis not present

## 2016-05-10 LAB — CBC WITH DIFFERENTIAL/PLATELET
Basophils Absolute: 0.1 10*3/uL (ref 0–0.1)
Basophils Relative: 0 %
Eosinophils Absolute: 0 10*3/uL (ref 0–0.7)
Eosinophils Relative: 0 %
HEMATOCRIT: 37.1 % (ref 35.0–47.0)
Hemoglobin: 12.4 g/dL (ref 12.0–16.0)
LYMPHS ABS: 0.6 10*3/uL — AB (ref 1.0–3.6)
Lymphocytes Relative: 3 %
MCH: 28.9 pg (ref 26.0–34.0)
MCHC: 33.5 g/dL (ref 32.0–36.0)
MCV: 86.2 fL (ref 80.0–100.0)
MONO ABS: 1 10*3/uL — AB (ref 0.2–0.9)
MONOS PCT: 6 %
NEUTROS ABS: 14.6 10*3/uL — AB (ref 1.4–6.5)
Neutrophils Relative %: 91 %
Platelets: 293 10*3/uL (ref 150–440)
RBC: 4.3 MIL/uL (ref 3.80–5.20)
RDW: 16.6 % — AB (ref 11.5–14.5)
WBC: 16.2 10*3/uL — ABNORMAL HIGH (ref 3.6–11.0)

## 2016-05-10 LAB — COMPREHENSIVE METABOLIC PANEL
ALBUMIN: 3.4 g/dL — AB (ref 3.5–5.0)
ALK PHOS: 66 U/L (ref 38–126)
ALT: 15 U/L (ref 14–54)
AST: 29 U/L (ref 15–41)
Anion gap: 10 (ref 5–15)
BUN: 23 mg/dL — ABNORMAL HIGH (ref 6–20)
CHLORIDE: 102 mmol/L (ref 101–111)
CO2: 23 mmol/L (ref 22–32)
CREATININE: 0.59 mg/dL (ref 0.44–1.00)
Calcium: 9.3 mg/dL (ref 8.9–10.3)
GFR calc non Af Amer: >60 mL/min (ref >60)
GLUCOSE: 134 mg/dL — AB (ref 65–99)
Potassium: 4.3 mmol/L (ref 3.5–5.1)
SODIUM: 135 mmol/L (ref 135–145)
Total Bilirubin: 0.8 mg/dL (ref 0.3–1.2)
Total Protein: 7.7 g/dL (ref 6.5–8.1)

## 2016-05-10 LAB — URINALYSIS, COMPLETE (UACMP) WITH MICROSCOPIC
BILIRUBIN URINE: NEGATIVE
Glucose, UA: NEGATIVE mg/dL
HGB URINE DIPSTICK: NEGATIVE
KETONES UR: NEGATIVE mg/dL
Nitrite: POSITIVE — AB
PROTEIN: 30 mg/dL — AB
SPECIFIC GRAVITY, URINE: 1.028 (ref 1.005–1.030)
pH: 7 (ref 5.0–8.0)

## 2016-05-10 LAB — TROPONIN I: Troponin I: <0.03 ng/mL (ref <0.03)

## 2016-05-10 MED ORDER — ACETAMINOPHEN 325 MG PO TABS
650.0000 mg | ORAL_TABLET | Freq: Two times a day (BID) | ORAL | Status: DC
  Administered 2016-05-11 – 2016-05-12 (×3): 650 mg via ORAL
  Filled 2016-05-10 (×2): qty 2

## 2016-05-10 MED ORDER — SIMVASTATIN 20 MG PO TABS
20.0000 mg | ORAL_TABLET | Freq: Every day | ORAL | Status: DC
  Administered 2016-05-10 – 2016-05-11 (×2): 20 mg via ORAL
  Filled 2016-05-10 (×2): qty 1

## 2016-05-10 MED ORDER — SODIUM CHLORIDE 0.9 % IV SOLN
INTRAVENOUS | Status: DC
  Administered 2016-05-10 – 2016-05-11 (×2): via INTRAVENOUS

## 2016-05-10 MED ORDER — SODIUM CHLORIDE 0.9 % IV BOLUS (SEPSIS)
500.0000 mL | Freq: Once | INTRAVENOUS | Status: AC
  Administered 2016-05-10: 500 mL via INTRAVENOUS

## 2016-05-10 MED ORDER — MIRTAZAPINE 15 MG PO TABS
7.5000 mg | ORAL_TABLET | Freq: Every day | ORAL | Status: DC
  Administered 2016-05-10 – 2016-05-11 (×2): 7.5 mg via ORAL
  Filled 2016-05-10 (×2): qty 1

## 2016-05-10 MED ORDER — ENOXAPARIN SODIUM 40 MG/0.4ML ~~LOC~~ SOLN
40.0000 mg | SUBCUTANEOUS | Status: DC
  Administered 2016-05-10: 40 mg via SUBCUTANEOUS
  Filled 2016-05-10: qty 0.4

## 2016-05-10 MED ORDER — CEFTRIAXONE SODIUM-DEXTROSE 1-3.74 GM-% IV SOLR
1.0000 g | INTRAVENOUS | Status: DC
  Administered 2016-05-10: 1 g via INTRAVENOUS
  Filled 2016-05-10 (×2): qty 50

## 2016-05-10 MED ORDER — NAPROXEN 250 MG PO TABS
250.0000 mg | ORAL_TABLET | Freq: Every day | ORAL | Status: DC
  Administered 2016-05-11 – 2016-05-12 (×2): 250 mg via ORAL
  Filled 2016-05-10 (×4): qty 1

## 2016-05-10 MED ORDER — ACETAMINOPHEN 325 MG PO TABS
650.0000 mg | ORAL_TABLET | Freq: Four times a day (QID) | ORAL | Status: DC | PRN
  Filled 2016-05-10: qty 2

## 2016-05-10 MED ORDER — DRONABINOL 2.5 MG PO CAPS
2.5000 mg | ORAL_CAPSULE | Freq: Every day | ORAL | Status: DC
  Administered 2016-05-11 – 2016-05-12 (×2): 2.5 mg via ORAL
  Filled 2016-05-10 (×2): qty 1

## 2016-05-10 MED ORDER — ASPIRIN EC 81 MG PO TBEC
81.0000 mg | DELAYED_RELEASE_TABLET | Freq: Every day | ORAL | Status: DC
  Administered 2016-05-11 – 2016-05-12 (×2): 81 mg via ORAL
  Filled 2016-05-10 (×2): qty 1

## 2016-05-10 MED ORDER — ONDANSETRON HCL 4 MG PO TABS: 4.0000 mg | ORAL_TABLET | Freq: Four times a day (QID) | ORAL | Status: DC | PRN

## 2016-05-10 MED ORDER — METOPROLOL TARTRATE 5 MG/5ML IV SOLN
5.0000 mg | Freq: Four times a day (QID) | INTRAVENOUS | Status: DC
  Administered 2016-05-10 – 2016-05-11 (×3): 5 mg via INTRAVENOUS
  Filled 2016-05-10 (×3): qty 5

## 2016-05-10 MED ORDER — SODIUM CHLORIDE 0.9% FLUSH
3.0000 mL | Freq: Two times a day (BID) | INTRAVENOUS | Status: DC
  Administered 2016-05-10 – 2016-05-12 (×4): 3 mL via INTRAVENOUS

## 2016-05-10 MED ORDER — ONDANSETRON HCL 4 MG/2ML IJ SOLN: 4.0000 mg | Freq: Four times a day (QID) | INTRAMUSCULAR | Status: DC | PRN

## 2016-05-10 MED ORDER — AMLODIPINE BESYLATE 10 MG PO TABS
10.0000 mg | ORAL_TABLET | Freq: Every day | ORAL | Status: DC
  Administered 2016-05-11 – 2016-05-12 (×2): 10 mg via ORAL
  Filled 2016-05-10 (×2): qty 1

## 2016-05-10 MED ORDER — DEXTROSE 5 % IV SOLN: 1.0000 g | INTRAVENOUS | Status: DC

## 2016-05-10 MED ORDER — ACETAMINOPHEN 650 MG RE SUPP
650.0000 mg | Freq: Four times a day (QID) | RECTAL | Status: DC | PRN
Start: 2016-05-10 — End: 2016-05-12
  Administered 2016-05-10: 650 mg via RECTAL

## 2016-05-10 MED ORDER — ACETAMINOPHEN 650 MG RE SUPP
RECTAL | Status: AC
  Administered 2016-05-10: 650 mg via RECTAL
  Filled 2016-05-10: qty 1

## 2016-05-10 NOTE — ED Notes (Signed)
Pt son at bedside, states pt is wheelchair bound and does not ambulate, MD made aware

## 2016-05-10 NOTE — ED Notes (Signed)
Pt changed at this time and cleaned, new diaper on pt. Pt warm and dry. Skin feels warm, rectal temp 100.5

## 2016-05-10 NOTE — Progress Notes (Signed)
Advanced care plan.  Purpose of the Encounter: CODE STATUS  Parties in Attendance: Patients son and daughter  Patient's Decision Capacity: not intact  Subjective/Patient's story: Patient is a 83 year old with advanced dementia with UTI    Objective/Medical story patient currently resides at a skilled nursing facility presenting with a urinary tract infection with sepsis    Goals of care determination: I discussed with the son and daughter regarding patient's CODE STATUS dear agreeable to a DO NOT RESUSCITATE status    CODE STATUS: DNR   Time spent discussing advanced care planning: 15 minutes

## 2016-05-10 NOTE — ED Notes (Signed)
Pt cleaned at this time or urine, pt dry and new diaper.

## 2016-05-10 NOTE — ED Notes (Signed)
Tylenol supp will not link with order, 650 given at thsit ime

## 2016-05-10 NOTE — ED Triage Notes (Signed)
Pt to ED via EMS from [facility], facility called EMS for possible stroke. Per EMS facility states pt has increased confusion and " not acting herself" and states pt was "slumped down in wheelchair all morning" Pt is poor historian due to hx of dementia. VS stable. neruo screen neg for stroke

## 2016-05-10 NOTE — ED Notes (Signed)
Patient transported to CT 

## 2016-05-10 NOTE — H&P (Signed)
[place] [facility] [place] NAME: Tonya Snyder    MR#:  [id number]  DATE OF BIRTH:  05-Nov-1920  DATE OF ADMISSION:  05/10/2016  PRIMARY CARE PHYSICIAN: Lavera Guise, MD   REQUESTING/REFERRING PHYSICIAN: Meade Maw MD  CHIEF COMPLAINT:   Chief Complaint  Patient presents with  . Altered Mental Status    HISTORY OF PRESENT ILLNESS: Tonya Snyder  is a 83 y.o. female with a known history of Dementia, hypertension and hyperlipidemia who is brought to the emergency room after she was found to be not her normal self. According to the son he source, slumped over in the chair. Patient normally is wheelchair bound. She does not communicate much and has advanced dementia. She was brought to the emergency room noted to be tachycardic elevated WBC count urine analysis shows she is got a urine infection  PAST MEDICAL HISTORY:   Past Medical History:  Diagnosis Date  . Dementia   . Hyperlipidemia   . Hypertension     PAST SURGICAL HISTORY: Past Surgical History:  Procedure Laterality Date  . ABDOMINAL HYSTERECTOMY    . HIP SURGERY      SOCIAL HISTORY:  Social History  Substance Use Topics  . Smoking status: Former Research scientist (life sciences)  . Smokeless tobacco: Current User    Types: Snuff  . Alcohol use No    FAMILY HISTORY:  Family History  Problem Relation Age of Onset  . Bladder Cancer Neg Hx   . Kidney cancer Neg Hx     DRUG ALLERGIES: No Known Allergies  REVIEW OF SYSTEMS:   CONSTITUTIONAL: Unable to provide due to mental status MEDICATIONS AT HOME:  Prior to Admission medications   Medication Sig Start Date End Date Taking? Authorizing Provider  acetaminophen (TYLENOL) 325 MG tablet Take 650 mg by mouth 2 (two) times daily.   Yes Historical Provider, MD  amLODipine (NORVASC) 10 MG tablet Take 10 mg by mouth daily.  10/05/13  Yes Historical Provider, MD  aspirin EC 81 MG tablet Take 81 mg by mouth daily.   Yes Historical Provider, MD  docusate  sodium (COLACE) 100 MG capsule Take 100 mg by mouth 2 (two) times daily.    Yes Historical Provider, MD  dronabinol (MARINOL) 2.5 MG capsule Take 2.5 mg by mouth daily. 05/03/16  Yes Historical Provider, MD  mirtazapine (REMERON) 15 MG tablet Take 7.5 mg by mouth at bedtime.    Yes Historical Provider, MD  Multiple Vitamins-Minerals (MULTIVITAMIN PO) Take 1 tablet by mouth daily. 07/27/13  Yes Historical Provider, MD  simvastatin (ZOCOR) 20 MG tablet Take 20 mg by mouth at bedtime.    Yes Historical Provider, MD  naproxen sodium (ANAPROX) 275 MG tablet Take 1 tablet (275 mg total) by mouth daily. Patient not taking: Reported on 05/10/2016 10/13/15 10/12/16  Sable Feil, PA-C      PHYSICAL EXAMINATION:   VITAL SIGNS: Blood pressure (!) 155/72, pulse (!) 115, temperature 98.6 F (37 C), temperature source Rectal, resp. rate (!) 26, SpO2 100 %.  GENERAL:  83 y.o.-year-old patient lying in the bed [place] EYES: Pupils equal, round, reactive to light and accommodation. No scleral icterus. Extraocular muscles intact.  HEENT: Head atraumatic, normocephalic. Oropharynx and nasopharynx clear.  NECK:  Supple, no jugular venous distention. No thyroid enlargement, no tenderness.  LUNGS: Normal breath sounds bilaterally, no wheezing, rales,rhonchi or crepitation. No use of accessory muscles of respiration.  CARDIOVASCULAR: Irregularly irregular No murmurs, rubs, or gallops.  ABDOMEN:  Soft, nontender, nondistended. Bowel sounds present. No organomegaly or mass.  EXTREMITIES: No pedal edema, cyanosis, or clubbing.  NEUROLOGIC: Poorly responsive PSYCHIATRIC: Poorly responsive SKIN: No obvious rash, lesion, or ulcer.   LABORATORY PANEL:   CBC  Recent Labs Lab 05/10/16 1341  WBC 16.2*  HGB 12.4  HCT 37.1  PLT 293  MCV 86.2  MCH 28.9  MCHC 33.5  RDW 16.6*  LYMPHSABS 0.6*  MONOABS 1.0*  EOSABS 0.0  BASOSABS 0.1    ------------------------------------------------------------------------------------------------------------------  Chemistries   Recent Labs Lab 05/10/16 1341  NA 135  K 4.3  CL 102  CO2 23  GLUCOSE 134*  BUN 23*  CREATININE 0.59  CALCIUM 9.3  AST 29  ALT 15  ALKPHOS 66  BILITOT 0.8   ------------------------------------------------------------------------------------------------------------------ CrCl cannot be calculated (Unknown ideal weight.). ------------------------------------------------------------------------------------------------------------------ No results for input(s): TSH, T4TOTAL, T3FREE, THYROIDAB in the last 72 hours.  Invalid input(s): FREET3   Coagulation profile No results for input(s): INR, PROTIME in the last 168 hours. ------------------------------------------------------------------------------------------------------------------- No results for input(s): DDIMER in the last 72 hours. -------------------------------------------------------------------------------------------------------------------  Cardiac Enzymes  Recent Labs Lab 05/10/16 1341  TROPONINI <0.03   ------------------------------------------------------------------------------------------------------------------ Invalid input(s): POCBNP  ---------------------------------------------------------------------------------------------------------------  Urinalysis    Component Value Date/Time   COLORURINE AMBER (A) 05/10/2016 1341   APPEARANCEUR HAZY (A) 05/10/2016 1341   APPEARANCEUR Clear 06/20/2015 1409   LABSPEC 1.028 05/10/2016 1341   PHURINE 7.0 05/10/2016 1341   GLUCOSEU NEGATIVE 05/10/2016 1341   HGBUR NEGATIVE 05/10/2016 1341   BILIRUBINUR NEGATIVE 05/10/2016 1341   BILIRUBINUR Negative 06/20/2015 1409   KETONESUR NEGATIVE 05/10/2016 1341   PROTEINUR 30 (A) 05/10/2016 1341   NITRITE POSITIVE (A) 05/10/2016 1341   LEUKOCYTESUR LARGE (A) 05/10/2016 1341    LEUKOCYTESUR 1+ (A) 06/20/2015 1409     RADIOLOGY: Ct Head Wo Contrast  Result Date: 05/10/2016 CLINICAL DATA:  Possible stroke. EXAM: CT HEAD WITHOUT CONTRAST TECHNIQUE: Contiguous axial images were obtained from the base of the skull through the vertex without intravenous contrast. COMPARISON:  12/11/2015 FINDINGS: Brain: There is no evidence for acute hemorrhage, hydrocephalus, mass lesion, or abnormal extra-axial fluid collection. No definite CT evidence for acute infarction. Diffuse loss of parenchymal volume is consistent with atrophy. Patchy low attenuation in the deep hemispheric and periventricular white matter is nonspecific, but likely reflects chronic microvascular ischemic demyelination. Vascular: Atherosclerotic calcification is visualized in the intracranial segments of the internal carotid arteries. No dense MCA sign. Major dural sinuses are unremarkable. Skull: No evidence for fracture. No worrisome lytic or sclerotic lesion. Sinuses/Orbits: Frothy air-fluid level noted left frontal sinus. Remaining visualized paranasal sinuses are clear. Visualized portions of the globes and intraorbital fat are unremarkable. Other: None. IMPRESSION: 1. No acute intracranial abnormality. 2. Atrophy with chronic small vessel white matter ischemic disease. 3. Left frontal sinusitis. Electronically Signed   By: Misty Stanley M.D.   On: 05/10/2016 14:05    EKG: Orders placed or performed during the hospital encounter of 05/10/16  . ED EKG  . ED EKG  . EKG 12-Lead  . EKG 12-Lead    IMPRESSION AND PLAN: Patient is a 83 year old with acute encephalopathy  1.  acute encephalopathy due to sepsis related to UTI  2.  Sepsis based on tachycardia and elevated WBC count I will place patient on IV ceftriaxone and follow blood cultures and urine cultures   3. Dementia advance if patient able to take her medications will give  4. Possible atrial fibrillation monitor on telemetry I will place an  IV  Lopressor scheduled  5. Code status d/w son and daughter pt made dnr  All the records are reviewed and case discussed with ED provider. Management plans discussed with the patient, family and they are in agreement.  CODE STATUS: Code Status History    Date Active Date Inactive Code Status Order ID Comments User Context   07/07/2014  2:25 PM 07/14/2014  5:19 PM Full Code [id number]  Demetrios Loll, MD ED    Advance Directive Documentation     Most Recent Value  Type of Advance Directive  Out of facility DNR (pink MOST or yellow form)  Pre-existing out of facility DNR order (yellow form or pink MOST form)  Pink MOST form placed in chart (order not valid for inpatient use)  "MOST" Form in Place?  -       TOTAL TIME TAKING CARE OF THIS PATIENT: 57minutes.    Dustin Flock M.D on 05/10/2016 at 4:15 PM  Between 7am to 6pm - Pager - [phone]  After 6pm go to [email or web address] - password [email or web address]  Office  [phone]  CC: Primary care physician; Lavera Guise, MD

## 2016-05-10 NOTE — ED Provider Notes (Signed)
Time Seen: Approximately 1424  I have reviewed the triage notes  Chief Complaint: Altered Mental Status   History of Present Illness: Tonya Snyder is a 83 y.o. female * who has a history of dementia and was transported here by EMS because patient was confused and may have had a syncopal episode. The history is vague from EMS and also from EMS from the facility is  Very vague. It has a history of dementia and apparently is non-mobile. The patient was later joined here by his family states she is essentially wheelchair-bound. She has not had a fever, vomiting, etc. Patient herself is unable to offer any history or review of systems   Past Medical History:  Diagnosis Date  . Dementia   . Hyperlipidemia   . Hypertension     Patient Active Problem List   Diagnosis Date Noted  . Protein-calorie malnutrition, severe ([place]) 07/09/2014  . Pneumonia 07/07/2014  . Fall 07/07/2014  . Hypokalemia 07/07/2014  . Lipoma of arm 10/14/2013    Past Surgical History:  Procedure Laterality Date  . ABDOMINAL HYSTERECTOMY    . HIP SURGERY      Past Surgical History:  Procedure Laterality Date  . ABDOMINAL HYSTERECTOMY    . HIP SURGERY      Current Outpatient Rx  . Order #: [id number] Class: Historical Med  . Order #: [id number] Class: Historical Med  . Order #: [id number] Class: Historical Med  . Order #: [id number] Class: Historical Med  . Order #: [id number] Class: Historical Med  . Order #: [id number] Class: Historical Med  . Order #: [id number] Class: Historical Med  . Order #: [id number] Class: Historical Med  . Order #: [id number] Class: Print    Allergies:  Patient has no known allergies.  Family History: Family History  Problem Relation Age of Onset  . Bladder Cancer Neg Hx   . Kidney cancer Neg Hx     Social History: Social History  Substance Use Topics  . Smoking status: Former Research scientist (life sciences)  . Smokeless tobacco: Current User    Types: Snuff  . Alcohol use No     Review of  Systems:   10 point review of systems was performed and was otherwise negative: Review of systems acquired from the family and the medical record Constitutional: No fever Eyes: No visual disturbances ENT: No sore throat, ear pain Cardiac: No chest pain Respiratory: No shortness of breath, wheezing, or stridor Abdomen: No abdominal pain, no vomiting, No diarrhea Endocrine: No weight loss, No night sweats Extremities: No peripheral edema, cyanosis Skin: No rashes, easy bruising Neurologic: No focal weakness, chronic trouble with speech and swallowing Urologic: No dysuria, Hematuria, or urinary frequency   Physical Exam:  ED Triage Vitals  Enc Vitals Group     BP 05/10/16 1332 140/78     Pulse Rate 05/10/16 1339 (!) 101     Resp 05/10/16 1332 16     Temp 05/10/16 1332 98.2 F (36.8 C)     Temp Source 05/10/16 1332 Oral     SpO2 05/10/16 1339 100 %     Weight --      Height --      Head Circumference --      [place] --      Pain Score --      Pain Loc --      Pain Edu? --      Excl. in [place]? --     General:Arousable. She does follow some commands. Nonverbal. Head: Normal cephalic , atraumatic Eyes: Pupils  equal , round, reactive to light Nose/Throat: No nasal drainage, patent upper airway without erythema or exudate.  Neck: Supple, Full range of motion, No anterior adenopathy or palpable thyroid masses Lungs: Clear to ascultation without wheezes , rhonchi, or rales Heart: Regular rate, regular rhythm without murmurs , gallops , or rubs Abdomen: Soft, non tender without rebound, guarding , or rigidity; bowel sounds positive and symmetric in all 4 quadrants. No organomegaly .        Extremities: Lower extremities show contracture Neurologic: n, Motor symmetric without obvious deficits, sensory intact Skin: warm, dry, no rashes   Labs:   All laboratory work was reviewed including any pertinent negatives or positives listed below:  Labs Reviewed  COMPREHENSIVE METABOLIC  PANEL - Abnormal; Notable for the following:       Result Value   Glucose, Bld 134 (*)    BUN 23 (*)    Albumin 3.4 (*)    All other components within normal limits  CBC WITH DIFFERENTIAL/PLATELET - Abnormal; Notable for the following:    WBC 16.2 (*)    RDW 16.6 (*)    Neutro Abs 14.6 (*)    Lymphs Abs 0.6 (*)    Monocytes Absolute 1.0 (*)    All other components within normal limits  TROPONIN I  URINALYSIS, COMPLETE (UACMP) WITH MICROSCOPIC    EKG:  ED ECG REPORT I, Daymon Larsen, the attending physician, personally viewed and interpreted this ECG.  Date: 05/10/2016 EKG Time: 1345 Rate: 109 Rhythm: Sinus tachycardia  With PACs Left ventricular hypertrophy QRS Axis: normal Intervals: normal ST/T Wave abnormalities: normal Conduction Disturbances: none Narrative Interpretation: unremarkable    Radiology:   "Ct Head Wo Contrast  Result Date: 05/10/2016 CLINICAL DATA:  Possible stroke. EXAM: CT HEAD WITHOUT CONTRAST TECHNIQUE: Contiguous axial images were obtained from the base of the skull through the vertex without intravenous contrast. COMPARISON:  12/11/2015 FINDINGS: Brain: There is no evidence for acute hemorrhage, hydrocephalus, mass lesion, or abnormal extra-axial fluid collection. No definite CT evidence for acute infarction. Diffuse loss of parenchymal volume is consistent with atrophy. Patchy low attenuation in the deep hemispheric and periventricular white matter is nonspecific, but likely reflects chronic microvascular ischemic demyelination. Vascular: Atherosclerotic calcification is visualized in the intracranial segments of the internal carotid arteries. No dense MCA sign. Major dural sinuses are unremarkable. Skull: No evidence for fracture. No worrisome lytic or sclerotic lesion. Sinuses/Orbits: Frothy air-fluid level noted left frontal sinus. Remaining visualized paranasal sinuses are clear. Visualized portions of the globes and intraorbital fat are  unremarkable. Other: None. IMPRESSION: 1. No acute intracranial abnormality. 2. Atrophy with chronic small vessel white matter ischemic disease. 3. Left frontal sinusitis. Electronically Signed   By: Misty Stanley M.D.   On: 05/10/2016 14:05  "   I personally reviewed the radiologic studies    ED Course:  Patient's stay here was uneventful. Patient presents with tachycardia what appears to be an elevated white blood cell count urinary tract infection likely has some mild encephalopathy secondary to a urinary tract infection. Patient was started on IV antibiotics with urine culture pending.     Assessment:  Acute encephalopathy Possible syncope Urinary tract infection      Plan:  Inpatient          Daymon Larsen, MD 05/10/16 1531

## 2016-05-11 LAB — CBC
HEMATOCRIT: 35.6 % (ref 35.0–47.0)
HEMOGLOBIN: 12.3 g/dL (ref 12.0–16.0)
MCH: 29.8 pg (ref 26.0–34.0)
MCHC: 34.5 g/dL (ref 32.0–36.0)
MCV: 86.5 fL (ref 80.0–100.0)
Platelets: 272 10*3/uL (ref 150–440)
RBC: 4.11 MIL/uL (ref 3.80–5.20)
RDW: 16.5 % — ABNORMAL HIGH (ref 11.5–14.5)
WBC: 15.5 10*3/uL — ABNORMAL HIGH (ref 3.6–11.0)

## 2016-05-11 LAB — BASIC METABOLIC PANEL
Anion gap: 9 (ref 5–15)
BUN: 14 mg/dL (ref 6–20)
CHLORIDE: 108 mmol/L (ref 101–111)
CO2: 21 mmol/L — AB (ref 22–32)
Calcium: 8.6 mg/dL — ABNORMAL LOW (ref 8.9–10.3)
Creatinine, Ser: 0.47 mg/dL (ref 0.44–1.00)
GFR calc Af Amer: >60 mL/min (ref >60)
GFR calc non Af Amer: >60 mL/min (ref >60)
Glucose, Bld: 116 mg/dL — ABNORMAL HIGH (ref 65–99)
POTASSIUM: 3.3 mmol/L — AB (ref 3.5–5.1)
SODIUM: 138 mmol/L (ref 135–145)

## 2016-05-11 LAB — MRSA PCR SCREENING: MRSA by PCR: NEGATIVE

## 2016-05-11 LAB — MAGNESIUM: MAGNESIUM: 1.7 mg/dL (ref 1.7–2.4)

## 2016-05-11 MED ORDER — POTASSIUM CHLORIDE 20 MEQ PO PACK
40.0000 meq | PACK | Freq: Once | ORAL | Status: AC
Start: 2016-05-11 — End: 2016-05-11
  Administered 2016-05-11: 40 meq via ORAL
  Filled 2016-05-11: qty 2

## 2016-05-11 MED ORDER — DEXTROSE 5 % IV SOLN
1.0000 g | INTRAVENOUS | Status: DC
  Administered 2016-05-11: 1 g via INTRAVENOUS
  Filled 2016-05-11 (×2): qty 10

## 2016-05-11 MED ORDER — METOPROLOL TARTRATE 25 MG PO TABS
12.5000 mg | ORAL_TABLET | Freq: Two times a day (BID) | ORAL | Status: DC
  Administered 2016-05-11 – 2016-05-12 (×4): 12.5 mg via ORAL
  Filled 2016-05-11 (×4): qty 1

## 2016-05-11 MED ORDER — ENOXAPARIN SODIUM 30 MG/0.3ML ~~LOC~~ SOLN
30.0000 mg | SUBCUTANEOUS | Status: DC
  Administered 2016-05-11: 30 mg via SUBCUTANEOUS
  Filled 2016-05-11: qty 0.3

## 2016-05-11 NOTE — Progress Notes (Signed)
CCMD notified this RN that patient had a 10 sec burst SVT. BP 166/43, HR 105, 98% RA. MD notified.

## 2016-05-11 NOTE — Progress Notes (Signed)
[facility] [place] NAME: Tonya Snyder    MR#:  [id number]  DATE OF BIRTH:  [birth year]  SUBJECTIVE:  Patient here with AMS  REVIEW OF SYSTEMS:    ROS Due to Dementia cannot provide Tolerating Diet: yes      DRUG ALLERGIES:  No Known Allergies  VITALS:  Blood pressure 135/76, pulse 60, temperature 99.9 F (37.7 C), temperature source Axillary, resp. rate 16, weight 42.8 kg (94 lb 6.4 oz), SpO2 92 %.  PHYSICAL EXAMINATION:   Physical Exam    LABORATORY PANEL:   CBC  Recent Labs Lab 05/11/16 0454  WBC 15.5*  HGB 12.3  HCT 35.6  PLT 272   ------------------------------------------------------------------------------------------------------------------  Chemistries   Recent Labs Lab 05/10/16 1341 05/11/16 0454  NA 135 138  K 4.3 3.3*  CL 102 108  CO2 23 21*  GLUCOSE 134* 116*  BUN 23* 14  CREATININE 0.59 0.47  CALCIUM 9.3 8.6*  AST 29  --   ALT 15  --   ALKPHOS 66  --   BILITOT 0.8  --    ------------------------------------------------------------------------------------------------------------------  Cardiac Enzymes  Recent Labs Lab 05/10/16 1341  TROPONINI <0.03   ------------------------------------------------------------------------------------------------------------------  RADIOLOGY:  Ct Head Wo Contrast  Result Date: 05/10/2016 CLINICAL DATA:  Possible stroke. EXAM: CT HEAD WITHOUT CONTRAST TECHNIQUE: Contiguous axial images were obtained from the base of the skull through the vertex without intravenous contrast. COMPARISON:  12/11/2015 FINDINGS: Brain: There is no evidence for acute hemorrhage, hydrocephalus, mass lesion, or abnormal extra-axial fluid collection. No definite CT evidence for acute infarction. Diffuse loss of parenchymal volume is consistent with atrophy. Patchy low attenuation in the deep hemispheric and periventricular white matter is nonspecific, but likely reflects chronic  microvascular ischemic demyelination. Vascular: Atherosclerotic calcification is visualized in the intracranial segments of the internal carotid arteries. No dense MCA sign. Major dural sinuses are unremarkable. Skull: No evidence for fracture. No worrisome lytic or sclerotic lesion. Sinuses/Orbits: Frothy air-fluid level noted left frontal sinus. Remaining visualized paranasal sinuses are clear. Visualized portions of the globes and intraorbital fat are unremarkable. Other: None. IMPRESSION: 1. No acute intracranial abnormality. 2. Atrophy with chronic small vessel white matter ischemic disease. 3. Left frontal sinusitis. Electronically Signed   By: Misty Stanley M.D.   On: 05/10/2016 14:05     ASSESSMENT AND PLAN:    83 year old female with dementia and essential hypertension who is brought in for altered mental status.  1. Acute metabolic encephalopathy in the setting of urinary tract infection on baseline dementia  2. Sepsis with tachycardia and leukocytosis due to UTI Continue Rocephin and follow up on blood and urine cultures  3. Severe dementia:  4. Essential hypertension: Continue Norvasc  5. Hyperlipidemia: Continue statin   CODE STATUS: DNR  TOTAL TIME TAKING CARE OF THIS PATIENT: 28 minutes.     POSSIBLE D/C tomorrow, DEPENDING ON CLINICAL CONDITION.   [place]ro [place] M.D on 05/11/2016 at 10:00 AM  Between 7am to 6pm - Pager - [phone] After 6pm go to [email or web address] - password [email or web address] [facility]  Office  [phone]  CC: Primary care physician; Lavera Guise, MD  Note: This dictation was prepared with Dragon dictation along with smaller phrase technology. Any transcriptional errors that result from this process are unintentional.

## 2016-05-11 NOTE — NC FL2 (Signed)
[place] LEVEL OF CARE SCREENING TOOL     IDENTIFICATION  Patient Name: Tonya Snyder Birthdate: January 26, 1921 Sex: female Admission Date (Current Location): 05/10/2016  [place] and [place] Number:  Engineering geologist and Address:  [facility], [address] [place], [place] [address]      Provider Number: [id number]  Attending Physician Name and Address:  Bettey Costa, MD  Relative Name and Phone Number:       Current Level of Care: Hospital Recommended Level of Care: [place] Prior Approval Number:    Date Approved/Denied:   PASRR Number:  [id number] )  Discharge Plan: SNF    Current Diagnoses: Patient Active Problem List   Diagnosis Date Noted  . Acute encephalopathy 05/10/2016  . Protein-calorie malnutrition, severe ([place]) 07/09/2014  . Pneumonia 07/07/2014  . Fall 07/07/2014  . Hypokalemia 07/07/2014  . Lipoma of arm 10/14/2013    Orientation RESPIRATION BLADDER Height & Weight     Self  Normal Incontinent Weight: 94 lb 6.4 oz (42.8 kg) Height:     BEHAVIORAL SYMPTOMS/MOOD NEUROLOGICAL BOWEL NUTRITION STATUS   (none)  (none) Continent Diet (Diet: Soft )  AMBULATORY STATUS COMMUNICATION OF NEEDS Skin   Extensive Assist Verbally Normal                       Personal Care Assistance Level of Assistance  Bathing, Feeding, Dressing Bathing Assistance: Limited assistance Feeding assistance: Limited assistance Dressing Assistance: Limited assistance     Functional Limitations Info  Sight, Hearing, Speech Sight Info: Adequate Hearing Info: Adequate Speech Info: Adequate    SPECIAL CARE FACTORS FREQUENCY                       Contractures      Additional Factors Info  Code Status, Allergies Code Status Info:  (DNR ) Allergies Info:  (No Known Allergies. )           Current Medications (05/11/2016):  This is the current hospital active medication list Current  Facility-Administered Medications  Medication Dose Route Frequency Provider Last Rate Last Dose  . 0.9 %  sodium chloride infusion   Intravenous Continuous Dustin Flock, MD 50 mL/hr at 05/10/16 2155    . acetaminophen (TYLENOL) tablet 650 mg  650 mg Oral Q6H PRN Dustin Flock, MD       Or  . acetaminophen (TYLENOL) suppository 650 mg  650 mg Rectal Q6H PRN Dustin Flock, MD   650 mg at 05/10/16 2118  . acetaminophen (TYLENOL) tablet 650 mg  650 mg Oral BID Dustin Flock, MD   650 mg at 05/11/16 1027  . amLODipine (NORVASC) tablet 10 mg  10 mg Oral Daily Dustin Flock, MD   10 mg at 05/11/16 2536  . aspirin EC tablet 81 mg  81 mg Oral Daily Dustin Flock, MD   81 mg at 05/11/16 6440  . cefTRIAXone (ROCEPHIN) 1 g in dextrose 5 % 50 mL IVPB  1 g Intravenous Q24H Sital Mody, MD      . dronabinol (MARINOL) capsule 2.5 mg  2.5 mg Oral Daily Dustin Flock, MD   2.5 mg at 05/11/16 0928  . enoxaparin (LOVENOX) injection 40 mg  40 mg Subcutaneous Q24H Dustin Flock, MD   40 mg at 05/10/16 2155  . metoprolol (LOPRESSOR) injection 5 mg  5 mg Intravenous Q6H Dustin Flock, MD   5 mg at 05/11/16 0558  . mirtazapine (REMERON)  tablet 7.5 mg  7.5 mg Oral QHS Dustin Flock, MD   7.5 mg at 05/10/16 2154  . naproxen (NAPROSYN) tablet 250 mg  250 mg Oral Daily Dustin Flock, MD      . ondansetron (ZOFRAN) tablet 4 mg  4 mg Oral Q6H PRN Dustin Flock, MD       Or  . ondansetron (ZOFRAN) injection 4 mg  4 mg Intravenous Q6H PRN Dustin Flock, MD      . simvastatin (ZOCOR) tablet 20 mg  20 mg Oral QHS Dustin Flock, MD   20 mg at 05/10/16 2154  . sodium chloride flush (NS) 0.9 % injection 3 mL  3 mL Intravenous Q12H Dustin Flock, MD   3 mL at 05/10/16 2155     Discharge Medications: Please see discharge summary for a list of discharge medications.  Relevant Imaging Results:  Relevant Lab Results:   Additional Information  (SSN: [id number])  Dyani Babel, Veronia Beets, LCSW

## 2016-05-11 NOTE — Progress Notes (Signed)
MEDICATION RELATED CONSULT NOTE - INITIAL   Pharmacy Consult for Electrolyte  Indication: Hypokalemia   No Known Allergies  Patient Measurements: Weight: 94 lb 6.4 oz (42.8 kg) Adjusted Body Weight:   Vital Signs: Temp: 99.9 F (37.7 C) (03/30 0830) Temp Source: Axillary (03/30 0830) BP: 166/43 (03/30 1134) Pulse Rate: 105 (03/30 1134) Intake/Output from previous day: 03/29 0701 - 03/30 0700 In: 304.2 [I.V.:304.2] Out: -  Intake/Output from this shift: No intake/output data recorded.  Labs:  Recent Labs  05/10/16 1341 05/11/16 0454  WBC 16.2* 15.5*  HGB 12.4 12.3  HCT 37.1 35.6  PLT 293 272  CREATININE 0.59 0.47  ALBUMIN 3.4*  --   PROT 7.7  --   AST 29  --   ALT 15  --   ALKPHOS 66  --   BILITOT 0.8  --    Estimated Creatinine Clearance: 29.7 mL/min (by C-G formula based on SCr of 0.47 mg/dL).   Microbiology: Recent Results (from the past 720 hour(s))  MRSA PCR Screening     Status: None   Collection Time: 05/11/16 12:28 AM  Result Value Ref Range Status   MRSA by PCR NEGATIVE NEGATIVE Final    Comment:        The GeneXpert MRSA Assay (FDA approved for NASAL specimens only), is one component of a comprehensive MRSA colonization surveillance program. It is not intended to diagnose MRSA infection nor to guide or monitor treatment for MRSA infections.     Medical History: Past Medical History:  Diagnosis Date  . Dementia   . Hyperlipidemia   . Hypertension     Medications:  Prescriptions Prior to Admission  Medication Sig Dispense Refill Last Dose  . acetaminophen (TYLENOL) 325 MG tablet Take 650 mg by mouth 2 (two) times daily.   05/10/2016 at 0800  . amLODipine (NORVASC) 10 MG tablet Take 10 mg by mouth daily.    05/10/2016 at 0900  . aspirin EC 81 MG tablet Take 81 mg by mouth daily.   05/10/2016 at 0900  . docusate sodium (COLACE) 100 MG capsule Take 100 mg by mouth 2 (two) times daily.    05/10/2016 at 0900  . dronabinol (MARINOL) 2.5 MG  capsule Take 2.5 mg by mouth daily.   05/09/2016 at 1600  . mirtazapine (REMERON) 15 MG tablet Take 7.5 mg by mouth at bedtime.    05/09/2016 at 2000  . Multiple Vitamins-Minerals (MULTIVITAMIN PO) Take 1 tablet by mouth daily.   05/10/2016 at 0900  . simvastatin (ZOCOR) 20 MG tablet Take 20 mg by mouth at bedtime.    05/09/2016 at 2100  . naproxen sodium (ANAPROX) 275 MG tablet Take 1 tablet (275 mg total) by mouth daily. (Patient not taking: Reported on 05/10/2016) 10 tablet 2 Not Taking at Unknown time   Scheduled:  . acetaminophen  650 mg Oral BID  . amLODipine  10 mg Oral Daily  . aspirin EC  81 mg Oral Daily  . cefTRIAXone (ROCEPHIN) IVPB 1 gram/50 mL D5W  1 g Intravenous Q24H  . dronabinol  2.5 mg Oral Daily  . enoxaparin (LOVENOX) injection  40 mg Subcutaneous Q24H  . metoprolol tartrate  12.5 mg Oral BID  . mirtazapine  7.5 mg Oral QHS  . naproxen  250 mg Oral Daily  . potassium chloride  40 mEq Oral Once  . simvastatin  20 mg Oral QHS  . sodium chloride flush  3 mL Intravenous Q12H    Assessment: Pharmacy consulted to assist  in the management of electrolytes in this 83 year old female.  Goal of Therapy:  Normalization  Plan:  Will give KCl 40 mEQ PO x 1. Will recheck labs in am.  Frenchie Pribyl D 05/11/2016,11:46 AM

## 2016-05-11 NOTE — Clinical Social Work Note (Addendum)
Clinical Social Work Assessment  Patient Details  Name: Tonya Snyder MRN: [id number] Date of Birth: Oct 10, 1920  Date of referral:  05/11/16               Reason for consult:  Other (Comment Required) (From [facility] )                Permission sought to share information with:    Permission granted to share information::     Name::        Agency::     Relationship::     Contact Information:     Housing/Transportation Living arrangements for the past 2 months:  [place] of Information:  Facility Patient Interpreter Needed:  None Criminal Activity/Legal Involvement Pertinent to Current Situation/Hospitalization:  No - Comment as needed Significant Relationships:  Adult Children, Other Family Members Lives with:  Facility Resident Do you feel safe going back to the place where you live?    Need for family participation in patient care:  Yes (Comment)  Care giving concerns:  Patient is a long term care resident at [facility].    Social Worker assessment / plan:  Holiday representative (CSW) reviewed chart and noted that patient is from [facility]. Per Tonya Snyder [facility] liaison patient is a long term care resident and can return to [facility] when medically stable. Per Tonya Snyder patient has been at [facility] since 01/04/16, she started out under short term rehab and then transitioned to long term care. Per Tonya Snyder patient received long term care medicaid in January 2018. CSW attempted to meet with patient however she was disoriented X3 and no family was at the bedside. CSW attempted to contact patient's family and left a voicemail from her son Tonya Snyder. FL2 complete.  CSW contacted patient's son/ HPOA Tonya Snyder, phone numbers provided by [facility]. Per son he is patient's HPOA and his brother Tonya Snyder is also a contact. Tonya Snyder is agreeable for patient to return to [facility] when stable. CSW made son aware that per MD note patient may D/C tomorrow. Son verbalized his understanding.   Per Tonya Snyder patient  can return to [facility] this weekend if medically stable back to room 105. RN will call report to [address] nurse. CSW will continue to follow and assist as needed.     Employment status:  Retired Nurse, adult PT Recommendations:  Not assessed at this time Information / Referral to community resources:  [place]  Patient/Family's Response to care: Patient's son Tonya Snyder is agreeable for patient to return to [facility].   Patient/Family's Understanding of and Emotional Response to Diagnosis, Current Treatment, and Prognosis: Tonya Snyder was very pleasant and thanked CSW for calling.   Emotional Assessment Appearance:  Appears stated age Attitude/Demeanor/Rapport:  Unable to Assess Affect (typically observed):  Unable to Assess Orientation:  Oriented to Self, Fluctuating Orientation (Suspected and/or reported Sundowners) Alcohol / Substance use:  Not Applicable Psych involvement (Current and /or in the community):  No (Comment)  Discharge Needs  Concerns to be addressed:  Discharge Planning Concerns Readmission within the last 30 days:  No Current discharge risk:  Chronically ill, Cognitively Impaired, Dependent with Mobility Barriers to Discharge:  Continued Medical Work up   [employer], Tonya Beets, LCSW 05/11/2016, 9:45 AM

## 2016-05-11 NOTE — Progress Notes (Signed)
Patient is alert to self only. Total care patient. Incont of urine. Meds in applesauce. TEDs and SCDs in place. Fair diet, but will drink ensure. Bed alarm on for safety.

## 2016-05-12 LAB — BASIC METABOLIC PANEL
ANION GAP: 7 (ref 5–15)
BUN: 21 mg/dL — AB (ref 6–20)
CO2: 22 mmol/L (ref 22–32)
Calcium: 8.2 mg/dL — ABNORMAL LOW (ref 8.9–10.3)
Chloride: 110 mmol/L (ref 101–111)
Creatinine, Ser: 0.64 mg/dL (ref 0.44–1.00)
GFR calc Af Amer: >60 mL/min (ref >60)
GFR calc non Af Amer: >60 mL/min (ref >60)
GLUCOSE: 106 mg/dL — AB (ref 65–99)
POTASSIUM: 3.5 mmol/L (ref 3.5–5.1)
Sodium: 139 mmol/L (ref 135–145)

## 2016-05-12 LAB — URINE CULTURE: Culture: >=100000 — AB

## 2016-05-12 LAB — MAGNESIUM: Magnesium: 1.6 mg/dL — ABNORMAL LOW (ref 1.7–2.4)

## 2016-05-12 MED ORDER — CEPHALEXIN 250 MG PO CAPS
250.0000 mg | ORAL_CAPSULE | Freq: Three times a day (TID) | ORAL | Status: DC
  Administered 2016-05-12: 250 mg via ORAL
  Filled 2016-05-12 (×2): qty 1

## 2016-05-12 MED ORDER — CEPHALEXIN 250 MG PO CAPS: 250.0000 mg | ORAL_CAPSULE | Freq: Three times a day (TID) | ORAL | 0 refills | Status: AC

## 2016-05-12 MED ORDER — POTASSIUM CHLORIDE 20 MEQ PO PACK
20.0000 meq | PACK | Freq: Once | ORAL | Status: AC
  Administered 2016-05-12: 20 meq via ORAL
  Filled 2016-05-12: qty 1

## 2016-05-12 MED ORDER — MAGNESIUM SULFATE 4 GM/100ML IV SOLN
4.0000 g | Freq: Once | INTRAVENOUS | Status: AC
  Administered 2016-05-12: 4 g via INTRAVENOUS
  Filled 2016-05-12: qty 100

## 2016-05-12 NOTE — Progress Notes (Signed)
Report called in to [place] RN at [facility]. EMS called for transport, awaiting arrival at this time.

## 2016-05-12 NOTE — Clinical Social Work Note (Addendum)
Patient will dc to [facility] today, most likely via non-emergent EMS. The facility is aware. The CSW attempted to contact the patient's HCPOA to confirm transportation and is waiting for him to call back (left message with his wife). CSW will con't to follow pending additional dc needs.  The patient's daughter called [facility] to confirm that she will travel by EMS.   Santiago Bumpers, MSW, Latanya Presser [phone]

## 2016-05-12 NOTE — Progress Notes (Signed)
MEDICATION RELATED CONSULT NOTE - INITIAL   Pharmacy Consult for Electrolyte  Indication: Hypokalemia   No Known Allergies  Patient Measurements: Weight: 94 lb 6.4 oz (42.8 kg) Adjusted Body Weight:   Vital Signs: Temp: 98.7 F (37.1 C) (03/30 2200) Temp Source: Axillary (03/30 2200) BP: 132/55 (03/30 2215) Pulse Rate: 98 (03/30 2215) Intake/Output from previous day: 03/30 0701 - 03/31 0700 In: 1040 [P.O.:540; I.V.:450; IV Piggyback:50] Out: -  Intake/Output from this shift: No intake/output data recorded.  Labs:  Recent Labs  05/10/16 1341 05/11/16 0454 05/12/16 0316  WBC 16.2* 15.5*  --   HGB 12.4 12.3  --   HCT 37.1 35.6  --   PLT 293 272  --   CREATININE 0.59 0.47 0.64  MG  --  1.7 1.6*  ALBUMIN 3.4*  --   --   PROT 7.7  --   --   AST 29  --   --   ALT 15  --   --   ALKPHOS 66  --   --   BILITOT 0.8  --   --    Estimated Creatinine Clearance: 29.7 mL/min (by C-G formula based on SCr of 0.64 mg/dL).   Microbiology: Recent Results (from the past 720 hour(s))  Urine culture     Status: Abnormal (Preliminary result)   Collection Time: 05/10/16  1:41 PM  Result Value Ref Range Status   Specimen Description URINE, CLEAN CATCH  Final   Special Requests NONE  Final   Culture (A)  Final    >=100,000 COLONIES/mL PROTEUS MIRABILIS SUSCEPTIBILITIES TO FOLLOW Performed at [facility], [address] [place], [place] [address]    Report Status PENDING  Incomplete  MRSA PCR Screening     Status: None   Collection Time: 05/11/16 12:28 AM  Result Value Ref Range Status   MRSA by PCR NEGATIVE NEGATIVE Final    Comment:        The GeneXpert MRSA Assay (FDA approved for NASAL specimens only), is one component of a comprehensive MRSA colonization surveillance program. It is not intended to diagnose MRSA infection nor to guide or monitor treatment for MRSA infections.     Medical History: Past Medical History:  Diagnosis Date  . Dementia   .  Hyperlipidemia   . Hypertension     Medications:  Prescriptions Prior to Admission  Medication Sig Dispense Refill Last Dose  . acetaminophen (TYLENOL) 325 MG tablet Take 650 mg by mouth 2 (two) times daily.   05/10/2016 at 0800  . amLODipine (NORVASC) 10 MG tablet Take 10 mg by mouth daily.    05/10/2016 at 0900  . aspirin EC 81 MG tablet Take 81 mg by mouth daily.   05/10/2016 at 0900  . docusate sodium (COLACE) 100 MG capsule Take 100 mg by mouth 2 (two) times daily.    05/10/2016 at 0900  . dronabinol (MARINOL) 2.5 MG capsule Take 2.5 mg by mouth daily.   05/09/2016 at 1600  . mirtazapine (REMERON) 15 MG tablet Take 7.5 mg by mouth at bedtime.    05/09/2016 at 2000  . Multiple Vitamins-Minerals (MULTIVITAMIN PO) Take 1 tablet by mouth daily.   05/10/2016 at 0900  . simvastatin (ZOCOR) 20 MG tablet Take 20 mg by mouth at bedtime.    05/09/2016 at 2100  . naproxen sodium (ANAPROX) 275 MG tablet Take 1 tablet (275 mg total) by mouth daily. (Patient not taking: Reported on 05/10/2016) 10 tablet 2 Not Taking at Unknown  time   Scheduled:  . acetaminophen  650 mg Oral BID  . amLODipine  10 mg Oral Daily  . aspirin EC  81 mg Oral Daily  . cefTRIAXone (ROCEPHIN) IVPB 1 gram/50 mL D5W  1 g Intravenous Q24H  . dronabinol  2.5 mg Oral Daily  . enoxaparin (LOVENOX) injection  30 mg Subcutaneous Q24H  . magnesium sulfate 1 - 4 g bolus IVPB  4 g Intravenous Once  . metoprolol tartrate  12.5 mg Oral BID  . mirtazapine  7.5 mg Oral QHS  . naproxen  250 mg Oral Daily  . potassium chloride  20 mEq Oral Once  . simvastatin  20 mg Oral QHS  . sodium chloride flush  3 mL Intravenous Q12H    Assessment: Pharmacy consulted to assist in the management of electrolytes in this 83 year old female.  Goal of Therapy:  Normalization  Plan:  KCl 20 meq po x 1 and Mag sulfate 4 g iv once. Will f/u AM labs.   Ulice Dash D 05/12/2016,7:36 AM

## 2016-05-12 NOTE — Discharge Summary (Signed)
[place] [facility] [place] NAME: Tonya Snyder    MR#:  [id number]  DATE OF BIRTH:  06-26-1920  DATE OF ADMISSION:  05/10/2016 ADMITTING PHYSICIAN: Dustin Flock, MD  DATE OF DISCHARGE: **05/12/2016  PRIMARY CARE PHYSICIAN: Lavera Guise, MD    ADMISSION DIAGNOSIS:  Lower urinary tract infectious disease [N39.0] Somnolence [R40.0]  DISCHARGE DIAGNOSIS:  Active Problems:   Acute encephalopathy   SECONDARY DIAGNOSIS:   Past Medical History:  Diagnosis Date  . Dementia   . Hyperlipidemia   . Hypertension     HOSPITAL COURSE:   83 year old female with dementia and essential hypertension who is brought in for altered mental status.  1. Acute metabolic encephalopathy in the setting of Proteus urinary tract infection on baseline dementia  2. Sepsis with tachycardia and leukocytosis due to Proteus UTI She was initiated on Rocephin. Blood culture negative. Urine culture showing Proteus which is sensitive to Keflex. She will be discharged on Keflex.  3. Severe dementia: No acute issues  4. Essential hypertension: Continue Norvasc Her blood pressure was slightly elevated during this hospital stay.  There are no plans to start any new medications at this time. She will need follow-up with her PCP   5. Hyperlipidemia: Continue statin   DISCHARGE CONDITIONS AND DIET:   Stable for discharge on soft diet  CONSULTS OBTAINED:    DRUG ALLERGIES:  No Known Allergies  DISCHARGE MEDICATIONS:   Current Discharge Medication List    START taking these medications   Details  cephALEXin (KEFLEX) 250 MG capsule Take 1 capsule (250 mg total) by mouth every 8 (eight) hours. Qty: 18 capsule, Refills: 0      CONTINUE these medications which have NOT CHANGED   Details  acetaminophen (TYLENOL) 325 MG tablet Take 650 mg by mouth 2 (two) times daily.    amLODipine (NORVASC) 10 MG tablet Take 10 mg by mouth daily.     aspirin EC 81 MG  tablet Take 81 mg by mouth daily.    docusate sodium (COLACE) 100 MG capsule Take 100 mg by mouth 2 (two) times daily.     dronabinol (MARINOL) 2.5 MG capsule Take 2.5 mg by mouth daily.    mirtazapine (REMERON) 15 MG tablet Take 7.5 mg by mouth at bedtime.     Multiple Vitamins-Minerals (MULTIVITAMIN PO) Take 1 tablet by mouth daily.    simvastatin (ZOCOR) 20 MG tablet Take 20 mg by mouth at bedtime.     naproxen sodium (ANAPROX) 275 MG tablet Take 1 tablet (275 mg total) by mouth daily. Qty: 10 tablet, Refills: 2          Today   CHIEF COMPLAINT:  She with dementia. No acute issues overnight  VITAL SIGNS:  Blood pressure (!) 147/72, pulse (!) 111, temperature 98.6 F (37 C), temperature source Oral, resp. rate 14, weight 42.8 kg (94 lb 6.4 oz), SpO2 94 %.   REVIEW OF SYSTEMS:  Review of Systems  Unable to perform ROS: Dementia     PHYSICAL EXAMINATION:  GENERAL:  83 y.o.-year-old patient lying in the bed with no acute distress. Appears younger than stated age NECK:  Supple, no jugular venous distention. No thyroid enlargement, no tenderness.  LUNGS: Normal breath sounds bilaterally, no wheezing, rales,rhonchi  No use of accessory muscles of respiration.  CARDIOVASCULAR: S1, S2 normal. No murmurs, rubs, or gallops.  ABDOMEN: Soft, non-tender, non-distended. Bowel sounds present. No organomegaly or mass.  EXTREMITIES: No pedal edema, cyanosis, or  clubbing.  PSYCHIATRIC: The patient is alert and oriented to name only   SKIN: No obvious rash, lesion, or ulcer.   DATA REVIEW:   CBC  Recent Labs Lab 05/11/16 0454  WBC 15.5*  HGB 12.3  HCT 35.6  PLT 272    Chemistries   Recent Labs Lab 05/10/16 1341  05/12/16 0316  NA 135  < > 139  K 4.3  < > 3.5  CL 102  < > 110  CO2 23  < > 22  GLUCOSE 134*  < > 106*  BUN 23*  < > 21*  CREATININE 0.59  < > 0.64  CALCIUM 9.3  < > 8.2*  MG  --   < > 1.6*  AST 29  --   --   ALT 15  --   --   ALKPHOS 66  --   --    BILITOT 0.8  --   --   < > = values in this interval not displayed.  Cardiac Enzymes  Recent Labs Lab 05/10/16 1341  TROPONINI <0.03    Microbiology Results  @[email or web address] @  RADIOLOGY:  Ct Head Wo Contrast  Result Date: 05/10/2016 CLINICAL DATA:  Possible stroke. EXAM: CT HEAD WITHOUT CONTRAST TECHNIQUE: Contiguous axial images were obtained from the base of the skull through the vertex without intravenous contrast. COMPARISON:  12/11/2015 FINDINGS: Brain: There is no evidence for acute hemorrhage, hydrocephalus, mass lesion, or abnormal extra-axial fluid collection. No definite CT evidence for acute infarction. Diffuse loss of parenchymal volume is consistent with atrophy. Patchy low attenuation in the deep hemispheric and periventricular white matter is nonspecific, but likely reflects chronic microvascular ischemic demyelination. Vascular: Atherosclerotic calcification is visualized in the intracranial segments of the internal carotid arteries. No dense MCA sign. Major dural sinuses are unremarkable. Skull: No evidence for fracture. No worrisome lytic or sclerotic lesion. Sinuses/Orbits: Frothy air-fluid level noted left frontal sinus. Remaining visualized paranasal sinuses are clear. Visualized portions of the globes and intraorbital fat are unremarkable. Other: None. IMPRESSION: 1. No acute intracranial abnormality. 2. Atrophy with chronic small vessel white matter ischemic disease. 3. Left frontal sinusitis. Electronically Signed   By: Misty Stanley M.D.   On: 05/10/2016 14:05      Current Discharge Medication List    START taking these medications   Details  cephALEXin (KEFLEX) 250 MG capsule Take 1 capsule (250 mg total) by mouth every 8 (eight) hours. Qty: 18 capsule, Refills: 0      CONTINUE these medications which have NOT CHANGED   Details  acetaminophen (TYLENOL) 325 MG tablet Take 650 mg by mouth 2 (two) times daily.    amLODipine (NORVASC) 10 MG tablet Take 10 mg by  mouth daily.     aspirin EC 81 MG tablet Take 81 mg by mouth daily.    docusate sodium (COLACE) 100 MG capsule Take 100 mg by mouth 2 (two) times daily.     dronabinol (MARINOL) 2.5 MG capsule Take 2.5 mg by mouth daily.    mirtazapine (REMERON) 15 MG tablet Take 7.5 mg by mouth at bedtime.     Multiple Vitamins-Minerals (MULTIVITAMIN PO) Take 1 tablet by mouth daily.    simvastatin (ZOCOR) 20 MG tablet Take 20 mg by mouth at bedtime.     naproxen sodium (ANAPROX) 275 MG tablet Take 1 tablet (275 mg total) by mouth daily. Qty: 10 tablet, Refills: 2            Stable for discharge SNF  Patient  should follow up with pcp  CODE STATUS:     Code Status Orders        Start     Ordered   05/10/16 2116  Do not attempt resuscitation (DNR)  Continuous    Question Answer Comment  In the event of cardiac or respiratory ARREST Do not call a "code blue"   In the event of cardiac or respiratory ARREST Do not perform Intubation, CPR, defibrillation or ACLS   In the event of cardiac or respiratory ARREST Use medication by any route, position, wound care, and other measures to relive pain and suffering. May use oxygen, suction and manual treatment of airway obstruction as needed for comfort.      05/10/16 2115    Code Status History    Date Active Date Inactive Code Status Order ID Comments User Context   05/10/2016  4:20 PM 05/10/2016  9:15 PM DNR [id number]  Dustin Flock, MD ED   07/07/2014  2:25 PM 07/14/2014  5:19 PM Full Code [id number]  Demetrios Loll, MD ED    Advance Directive Documentation     Most Recent Value  Type of Advance Directive  Out of facility DNR (pink MOST or yellow form)  Pre-existing out of facility DNR order (yellow form or pink MOST form)  Pink MOST form placed in chart (order not valid for inpatient use)  "MOST" Form in Place?  -      TOTAL TIME TAKING CARE OF THIS PATIENT: 37 minutes.    Note: This dictation was prepared with Dragon dictation along with  smaller phrase technology. Any transcriptional errors that result from this process are unintentional.  Deziree Mokry M.D on 05/12/2016 at 9:41 AM  Between 7am to 6pm - Pager - [phone] After 6pm go to [email or web address] - password [email or web address] [facility]  Office  [phone]  CC: Primary care physician; Lavera Guise, MD

## 2016-05-17 ENCOUNTER — Other Ambulatory Visit: Payer: Self-pay | Admitting: *Deleted

## 2016-05-17 NOTE — Patient Outreach (Signed)
[facility]) Care Management  05/17/2016  Tonya Snyder 10/14/1920 [id number]  Spoke with [name] at facility, she confirms that patient is a LTC resident of facility, with no plans to discharge.  Plan to sign off as not [facility] care management needs assessed at this time.  Royetta Crochet. Laymond Purser, RN, BSN, [place] [phone]) Business Cell  [phone]) Toll Free Office

## 2016-05-24 DIAGNOSIS — D519 Vitamin B12 deficiency anemia, unspecified: Secondary | ICD-10-CM | POA: Diagnosis not present

## 2016-05-24 DIAGNOSIS — G308 Other Alzheimer's disease: Secondary | ICD-10-CM | POA: Diagnosis not present

## 2016-05-24 DIAGNOSIS — I10 Essential (primary) hypertension: Secondary | ICD-10-CM | POA: Diagnosis not present

## 2016-05-24 DIAGNOSIS — D381 Neoplasm of uncertain behavior of trachea, bronchus and lung: Secondary | ICD-10-CM | POA: Diagnosis not present

## 2016-05-24 DIAGNOSIS — E785 Hyperlipidemia, unspecified: Secondary | ICD-10-CM | POA: Diagnosis not present

## 2016-05-24 DIAGNOSIS — M545 Low back pain: Secondary | ICD-10-CM | POA: Diagnosis not present

## 2016-05-24 DIAGNOSIS — D4100 Neoplasm of uncertain behavior of unspecified kidney: Secondary | ICD-10-CM | POA: Diagnosis not present

## 2016-06-19 DIAGNOSIS — M545 Low back pain: Secondary | ICD-10-CM | POA: Diagnosis not present

## 2016-06-19 DIAGNOSIS — E785 Hyperlipidemia, unspecified: Secondary | ICD-10-CM | POA: Diagnosis not present

## 2016-06-19 DIAGNOSIS — G308 Other Alzheimer's disease: Secondary | ICD-10-CM | POA: Diagnosis not present

## 2016-06-19 DIAGNOSIS — D381 Neoplasm of uncertain behavior of trachea, bronchus and lung: Secondary | ICD-10-CM | POA: Diagnosis not present

## 2016-06-19 DIAGNOSIS — I10 Essential (primary) hypertension: Secondary | ICD-10-CM | POA: Diagnosis not present

## 2016-06-19 DIAGNOSIS — M8580 Other specified disorders of bone density and structure, unspecified site: Secondary | ICD-10-CM | POA: Diagnosis not present

## 2016-06-19 DIAGNOSIS — D4100 Neoplasm of uncertain behavior of unspecified kidney: Secondary | ICD-10-CM | POA: Diagnosis not present

## 2016-06-20 DIAGNOSIS — I10 Essential (primary) hypertension: Secondary | ICD-10-CM | POA: Diagnosis not present

## 2016-07-18 DIAGNOSIS — M549 Dorsalgia, unspecified: Secondary | ICD-10-CM | POA: Diagnosis not present

## 2016-07-18 DIAGNOSIS — M810 Age-related osteoporosis without current pathological fracture: Secondary | ICD-10-CM | POA: Diagnosis not present

## 2016-07-18 DIAGNOSIS — F0390 Unspecified dementia without behavioral disturbance: Secondary | ICD-10-CM | POA: Diagnosis not present

## 2016-07-18 DIAGNOSIS — E785 Hyperlipidemia, unspecified: Secondary | ICD-10-CM | POA: Diagnosis not present

## 2016-07-18 DIAGNOSIS — D381 Neoplasm of uncertain behavior of trachea, bronchus and lung: Secondary | ICD-10-CM | POA: Diagnosis not present

## 2016-07-18 DIAGNOSIS — D4100 Neoplasm of uncertain behavior of unspecified kidney: Secondary | ICD-10-CM | POA: Diagnosis not present

## 2016-07-18 DIAGNOSIS — I10 Essential (primary) hypertension: Secondary | ICD-10-CM | POA: Diagnosis not present

## 2016-08-09 DIAGNOSIS — G309 Alzheimer's disease, unspecified: Secondary | ICD-10-CM | POA: Diagnosis not present

## 2016-08-09 DIAGNOSIS — R1312 Dysphagia, oropharyngeal phase: Secondary | ICD-10-CM | POA: Diagnosis not present

## 2016-08-09 DIAGNOSIS — M8580 Other specified disorders of bone density and structure, unspecified site: Secondary | ICD-10-CM | POA: Diagnosis not present

## 2016-08-09 DIAGNOSIS — M6281 Muscle weakness (generalized): Secondary | ICD-10-CM | POA: Diagnosis not present

## 2016-08-09 DIAGNOSIS — I2510 Atherosclerotic heart disease of native coronary artery without angina pectoris: Secondary | ICD-10-CM | POA: Diagnosis not present

## 2016-08-09 DIAGNOSIS — I10 Essential (primary) hypertension: Secondary | ICD-10-CM | POA: Diagnosis not present

## 2016-08-09 DIAGNOSIS — S32010S Wedge compression fracture of first lumbar vertebra, sequela: Secondary | ICD-10-CM | POA: Diagnosis not present

## 2016-08-09 DIAGNOSIS — C642 Malignant neoplasm of left kidney, except renal pelvis: Secondary | ICD-10-CM | POA: Diagnosis not present

## 2016-08-09 DIAGNOSIS — R41841 Cognitive communication deficit: Secondary | ICD-10-CM | POA: Diagnosis not present

## 2016-08-22 DIAGNOSIS — S32010S Wedge compression fracture of first lumbar vertebra, sequela: Secondary | ICD-10-CM | POA: Diagnosis not present

## 2016-08-22 DIAGNOSIS — I2510 Atherosclerotic heart disease of native coronary artery without angina pectoris: Secondary | ICD-10-CM | POA: Diagnosis not present

## 2016-08-22 DIAGNOSIS — M8580 Other specified disorders of bone density and structure, unspecified site: Secondary | ICD-10-CM | POA: Diagnosis not present

## 2016-08-22 DIAGNOSIS — M6281 Muscle weakness (generalized): Secondary | ICD-10-CM | POA: Diagnosis not present

## 2016-08-22 DIAGNOSIS — R1312 Dysphagia, oropharyngeal phase: Secondary | ICD-10-CM | POA: Diagnosis not present

## 2016-08-22 DIAGNOSIS — I10 Essential (primary) hypertension: Secondary | ICD-10-CM | POA: Diagnosis not present

## 2016-08-22 DIAGNOSIS — G309 Alzheimer's disease, unspecified: Secondary | ICD-10-CM | POA: Diagnosis not present

## 2016-08-22 DIAGNOSIS — C642 Malignant neoplasm of left kidney, except renal pelvis: Secondary | ICD-10-CM | POA: Diagnosis not present

## 2016-08-22 DIAGNOSIS — R41841 Cognitive communication deficit: Secondary | ICD-10-CM | POA: Diagnosis not present

## 2016-08-23 DIAGNOSIS — G309 Alzheimer's disease, unspecified: Secondary | ICD-10-CM | POA: Diagnosis not present

## 2016-08-23 DIAGNOSIS — C642 Malignant neoplasm of left kidney, except renal pelvis: Secondary | ICD-10-CM | POA: Diagnosis not present

## 2016-08-23 DIAGNOSIS — R41841 Cognitive communication deficit: Secondary | ICD-10-CM | POA: Diagnosis not present

## 2016-08-23 DIAGNOSIS — M6281 Muscle weakness (generalized): Secondary | ICD-10-CM | POA: Diagnosis not present

## 2016-08-23 DIAGNOSIS — S32010S Wedge compression fracture of first lumbar vertebra, sequela: Secondary | ICD-10-CM | POA: Diagnosis not present

## 2016-08-23 DIAGNOSIS — R1312 Dysphagia, oropharyngeal phase: Secondary | ICD-10-CM | POA: Diagnosis not present

## 2016-08-23 DIAGNOSIS — M8580 Other specified disorders of bone density and structure, unspecified site: Secondary | ICD-10-CM | POA: Diagnosis not present

## 2016-08-23 DIAGNOSIS — I2510 Atherosclerotic heart disease of native coronary artery without angina pectoris: Secondary | ICD-10-CM | POA: Diagnosis not present

## 2016-08-23 DIAGNOSIS — I10 Essential (primary) hypertension: Secondary | ICD-10-CM | POA: Diagnosis not present

## 2016-08-24 DIAGNOSIS — I10 Essential (primary) hypertension: Secondary | ICD-10-CM | POA: Diagnosis not present

## 2016-08-24 DIAGNOSIS — M6281 Muscle weakness (generalized): Secondary | ICD-10-CM | POA: Diagnosis not present

## 2016-08-24 DIAGNOSIS — I2510 Atherosclerotic heart disease of native coronary artery without angina pectoris: Secondary | ICD-10-CM | POA: Diagnosis not present

## 2016-08-24 DIAGNOSIS — S32010S Wedge compression fracture of first lumbar vertebra, sequela: Secondary | ICD-10-CM | POA: Diagnosis not present

## 2016-08-24 DIAGNOSIS — M8580 Other specified disorders of bone density and structure, unspecified site: Secondary | ICD-10-CM | POA: Diagnosis not present

## 2016-08-24 DIAGNOSIS — R1312 Dysphagia, oropharyngeal phase: Secondary | ICD-10-CM | POA: Diagnosis not present

## 2016-08-24 DIAGNOSIS — R41841 Cognitive communication deficit: Secondary | ICD-10-CM | POA: Diagnosis not present

## 2016-08-24 DIAGNOSIS — G309 Alzheimer's disease, unspecified: Secondary | ICD-10-CM | POA: Diagnosis not present

## 2016-08-24 DIAGNOSIS — C642 Malignant neoplasm of left kidney, except renal pelvis: Secondary | ICD-10-CM | POA: Diagnosis not present

## 2016-08-27 DIAGNOSIS — C642 Malignant neoplasm of left kidney, except renal pelvis: Secondary | ICD-10-CM | POA: Diagnosis not present

## 2016-08-27 DIAGNOSIS — R41841 Cognitive communication deficit: Secondary | ICD-10-CM | POA: Diagnosis not present

## 2016-08-27 DIAGNOSIS — S32010S Wedge compression fracture of first lumbar vertebra, sequela: Secondary | ICD-10-CM | POA: Diagnosis not present

## 2016-08-27 DIAGNOSIS — G309 Alzheimer's disease, unspecified: Secondary | ICD-10-CM | POA: Diagnosis not present

## 2016-08-27 DIAGNOSIS — M6281 Muscle weakness (generalized): Secondary | ICD-10-CM | POA: Diagnosis not present

## 2016-08-27 DIAGNOSIS — I2510 Atherosclerotic heart disease of native coronary artery without angina pectoris: Secondary | ICD-10-CM | POA: Diagnosis not present

## 2016-08-27 DIAGNOSIS — R1312 Dysphagia, oropharyngeal phase: Secondary | ICD-10-CM | POA: Diagnosis not present

## 2016-08-27 DIAGNOSIS — M8580 Other specified disorders of bone density and structure, unspecified site: Secondary | ICD-10-CM | POA: Diagnosis not present

## 2016-08-27 DIAGNOSIS — I10 Essential (primary) hypertension: Secondary | ICD-10-CM | POA: Diagnosis not present

## 2016-09-19 DIAGNOSIS — G308 Other Alzheimer's disease: Secondary | ICD-10-CM | POA: Diagnosis not present

## 2016-09-19 DIAGNOSIS — D4100 Neoplasm of uncertain behavior of unspecified kidney: Secondary | ICD-10-CM | POA: Diagnosis not present

## 2016-09-19 DIAGNOSIS — M8588 Other specified disorders of bone density and structure, other site: Secondary | ICD-10-CM | POA: Diagnosis not present

## 2016-09-19 DIAGNOSIS — M545 Low back pain: Secondary | ICD-10-CM | POA: Diagnosis not present

## 2016-09-19 DIAGNOSIS — E785 Hyperlipidemia, unspecified: Secondary | ICD-10-CM | POA: Diagnosis not present

## 2016-09-19 DIAGNOSIS — I10 Essential (primary) hypertension: Secondary | ICD-10-CM | POA: Diagnosis not present

## 2016-09-19 DIAGNOSIS — D381 Neoplasm of uncertain behavior of trachea, bronchus and lung: Secondary | ICD-10-CM | POA: Diagnosis not present

## 2016-09-20 DIAGNOSIS — Z79899 Other long term (current) drug therapy: Secondary | ICD-10-CM | POA: Diagnosis not present

## 2016-11-19 DIAGNOSIS — I10 Essential (primary) hypertension: Secondary | ICD-10-CM | POA: Diagnosis not present

## 2016-11-19 DIAGNOSIS — D381 Neoplasm of uncertain behavior of trachea, bronchus and lung: Secondary | ICD-10-CM | POA: Diagnosis not present

## 2016-11-19 DIAGNOSIS — M8588 Other specified disorders of bone density and structure, other site: Secondary | ICD-10-CM | POA: Diagnosis not present

## 2016-11-19 DIAGNOSIS — D4100 Neoplasm of uncertain behavior of unspecified kidney: Secondary | ICD-10-CM | POA: Diagnosis not present

## 2016-11-19 DIAGNOSIS — E785 Hyperlipidemia, unspecified: Secondary | ICD-10-CM | POA: Diagnosis not present

## 2016-11-19 DIAGNOSIS — M549 Dorsalgia, unspecified: Secondary | ICD-10-CM | POA: Diagnosis not present

## 2016-11-19 DIAGNOSIS — F0390 Unspecified dementia without behavioral disturbance: Secondary | ICD-10-CM | POA: Diagnosis not present

## 2016-12-11 DIAGNOSIS — M6281 Muscle weakness (generalized): Secondary | ICD-10-CM | POA: Diagnosis not present

## 2016-12-11 DIAGNOSIS — R1312 Dysphagia, oropharyngeal phase: Secondary | ICD-10-CM | POA: Diagnosis not present

## 2016-12-11 DIAGNOSIS — M8580 Other specified disorders of bone density and structure, unspecified site: Secondary | ICD-10-CM | POA: Diagnosis not present

## 2016-12-11 DIAGNOSIS — C642 Malignant neoplasm of left kidney, except renal pelvis: Secondary | ICD-10-CM | POA: Diagnosis not present

## 2016-12-11 DIAGNOSIS — I10 Essential (primary) hypertension: Secondary | ICD-10-CM | POA: Diagnosis not present

## 2016-12-11 DIAGNOSIS — G309 Alzheimer's disease, unspecified: Secondary | ICD-10-CM | POA: Diagnosis not present

## 2016-12-11 DIAGNOSIS — S32010S Wedge compression fracture of first lumbar vertebra, sequela: Secondary | ICD-10-CM | POA: Diagnosis not present

## 2016-12-11 DIAGNOSIS — E785 Hyperlipidemia, unspecified: Secondary | ICD-10-CM | POA: Diagnosis not present

## 2016-12-11 DIAGNOSIS — R41841 Cognitive communication deficit: Secondary | ICD-10-CM | POA: Diagnosis not present

## 2016-12-13 DIAGNOSIS — S32010S Wedge compression fracture of first lumbar vertebra, sequela: Secondary | ICD-10-CM | POA: Diagnosis not present

## 2016-12-13 DIAGNOSIS — M6281 Muscle weakness (generalized): Secondary | ICD-10-CM | POA: Diagnosis not present

## 2016-12-13 DIAGNOSIS — I2510 Atherosclerotic heart disease of native coronary artery without angina pectoris: Secondary | ICD-10-CM | POA: Diagnosis not present

## 2016-12-13 DIAGNOSIS — R41841 Cognitive communication deficit: Secondary | ICD-10-CM | POA: Diagnosis not present

## 2016-12-13 DIAGNOSIS — M24569 Contracture, unspecified knee: Secondary | ICD-10-CM | POA: Diagnosis not present

## 2016-12-13 DIAGNOSIS — G309 Alzheimer's disease, unspecified: Secondary | ICD-10-CM | POA: Diagnosis not present

## 2016-12-13 DIAGNOSIS — R1312 Dysphagia, oropharyngeal phase: Secondary | ICD-10-CM | POA: Diagnosis not present

## 2016-12-13 DIAGNOSIS — M8580 Other specified disorders of bone density and structure, unspecified site: Secondary | ICD-10-CM | POA: Diagnosis not present

## 2016-12-13 DIAGNOSIS — C642 Malignant neoplasm of left kidney, except renal pelvis: Secondary | ICD-10-CM | POA: Diagnosis not present

## 2016-12-14 DIAGNOSIS — M8580 Other specified disorders of bone density and structure, unspecified site: Secondary | ICD-10-CM | POA: Diagnosis not present

## 2016-12-14 DIAGNOSIS — I2510 Atherosclerotic heart disease of native coronary artery without angina pectoris: Secondary | ICD-10-CM | POA: Diagnosis not present

## 2016-12-14 DIAGNOSIS — R1312 Dysphagia, oropharyngeal phase: Secondary | ICD-10-CM | POA: Diagnosis not present

## 2016-12-14 DIAGNOSIS — G309 Alzheimer's disease, unspecified: Secondary | ICD-10-CM | POA: Diagnosis not present

## 2016-12-14 DIAGNOSIS — M24569 Contracture, unspecified knee: Secondary | ICD-10-CM | POA: Diagnosis not present

## 2016-12-14 DIAGNOSIS — R41841 Cognitive communication deficit: Secondary | ICD-10-CM | POA: Diagnosis not present

## 2016-12-14 DIAGNOSIS — S32010S Wedge compression fracture of first lumbar vertebra, sequela: Secondary | ICD-10-CM | POA: Diagnosis not present

## 2016-12-14 DIAGNOSIS — C642 Malignant neoplasm of left kidney, except renal pelvis: Secondary | ICD-10-CM | POA: Diagnosis not present

## 2016-12-14 DIAGNOSIS — M6281 Muscle weakness (generalized): Secondary | ICD-10-CM | POA: Diagnosis not present

## 2016-12-17 DIAGNOSIS — M24569 Contracture, unspecified knee: Secondary | ICD-10-CM | POA: Diagnosis not present

## 2016-12-17 DIAGNOSIS — I2510 Atherosclerotic heart disease of native coronary artery without angina pectoris: Secondary | ICD-10-CM | POA: Diagnosis not present

## 2016-12-17 DIAGNOSIS — G309 Alzheimer's disease, unspecified: Secondary | ICD-10-CM | POA: Diagnosis not present

## 2016-12-17 DIAGNOSIS — C642 Malignant neoplasm of left kidney, except renal pelvis: Secondary | ICD-10-CM | POA: Diagnosis not present

## 2016-12-17 DIAGNOSIS — M6281 Muscle weakness (generalized): Secondary | ICD-10-CM | POA: Diagnosis not present

## 2016-12-17 DIAGNOSIS — R1312 Dysphagia, oropharyngeal phase: Secondary | ICD-10-CM | POA: Diagnosis not present

## 2016-12-17 DIAGNOSIS — M8580 Other specified disorders of bone density and structure, unspecified site: Secondary | ICD-10-CM | POA: Diagnosis not present

## 2016-12-17 DIAGNOSIS — S32010S Wedge compression fracture of first lumbar vertebra, sequela: Secondary | ICD-10-CM | POA: Diagnosis not present

## 2016-12-17 DIAGNOSIS — R41841 Cognitive communication deficit: Secondary | ICD-10-CM | POA: Diagnosis not present

## 2016-12-18 DIAGNOSIS — R41841 Cognitive communication deficit: Secondary | ICD-10-CM | POA: Diagnosis not present

## 2016-12-18 DIAGNOSIS — S32010S Wedge compression fracture of first lumbar vertebra, sequela: Secondary | ICD-10-CM | POA: Diagnosis not present

## 2016-12-18 DIAGNOSIS — M6281 Muscle weakness (generalized): Secondary | ICD-10-CM | POA: Diagnosis not present

## 2016-12-18 DIAGNOSIS — G309 Alzheimer's disease, unspecified: Secondary | ICD-10-CM | POA: Diagnosis not present

## 2016-12-18 DIAGNOSIS — C642 Malignant neoplasm of left kidney, except renal pelvis: Secondary | ICD-10-CM | POA: Diagnosis not present

## 2016-12-18 DIAGNOSIS — R1312 Dysphagia, oropharyngeal phase: Secondary | ICD-10-CM | POA: Diagnosis not present

## 2016-12-18 DIAGNOSIS — I2510 Atherosclerotic heart disease of native coronary artery without angina pectoris: Secondary | ICD-10-CM | POA: Diagnosis not present

## 2016-12-18 DIAGNOSIS — M24569 Contracture, unspecified knee: Secondary | ICD-10-CM | POA: Diagnosis not present

## 2016-12-18 DIAGNOSIS — M8580 Other specified disorders of bone density and structure, unspecified site: Secondary | ICD-10-CM | POA: Diagnosis not present

## 2016-12-19 DIAGNOSIS — R1312 Dysphagia, oropharyngeal phase: Secondary | ICD-10-CM | POA: Diagnosis not present

## 2016-12-19 DIAGNOSIS — M6281 Muscle weakness (generalized): Secondary | ICD-10-CM | POA: Diagnosis not present

## 2016-12-19 DIAGNOSIS — I2510 Atherosclerotic heart disease of native coronary artery without angina pectoris: Secondary | ICD-10-CM | POA: Diagnosis not present

## 2016-12-19 DIAGNOSIS — S32010S Wedge compression fracture of first lumbar vertebra, sequela: Secondary | ICD-10-CM | POA: Diagnosis not present

## 2016-12-19 DIAGNOSIS — R41841 Cognitive communication deficit: Secondary | ICD-10-CM | POA: Diagnosis not present

## 2016-12-19 DIAGNOSIS — G309 Alzheimer's disease, unspecified: Secondary | ICD-10-CM | POA: Diagnosis not present

## 2016-12-19 DIAGNOSIS — C642 Malignant neoplasm of left kidney, except renal pelvis: Secondary | ICD-10-CM | POA: Diagnosis not present

## 2016-12-19 DIAGNOSIS — M24569 Contracture, unspecified knee: Secondary | ICD-10-CM | POA: Diagnosis not present

## 2016-12-19 DIAGNOSIS — M8580 Other specified disorders of bone density and structure, unspecified site: Secondary | ICD-10-CM | POA: Diagnosis not present

## 2016-12-20 DIAGNOSIS — R1312 Dysphagia, oropharyngeal phase: Secondary | ICD-10-CM | POA: Diagnosis not present

## 2016-12-20 DIAGNOSIS — M6281 Muscle weakness (generalized): Secondary | ICD-10-CM | POA: Diagnosis not present

## 2016-12-20 DIAGNOSIS — I2510 Atherosclerotic heart disease of native coronary artery without angina pectoris: Secondary | ICD-10-CM | POA: Diagnosis not present

## 2016-12-20 DIAGNOSIS — M8580 Other specified disorders of bone density and structure, unspecified site: Secondary | ICD-10-CM | POA: Diagnosis not present

## 2016-12-20 DIAGNOSIS — C642 Malignant neoplasm of left kidney, except renal pelvis: Secondary | ICD-10-CM | POA: Diagnosis not present

## 2016-12-20 DIAGNOSIS — G309 Alzheimer's disease, unspecified: Secondary | ICD-10-CM | POA: Diagnosis not present

## 2016-12-20 DIAGNOSIS — S32010S Wedge compression fracture of first lumbar vertebra, sequela: Secondary | ICD-10-CM | POA: Diagnosis not present

## 2016-12-20 DIAGNOSIS — M24569 Contracture, unspecified knee: Secondary | ICD-10-CM | POA: Diagnosis not present

## 2016-12-20 DIAGNOSIS — R41841 Cognitive communication deficit: Secondary | ICD-10-CM | POA: Diagnosis not present

## 2016-12-21 DIAGNOSIS — C642 Malignant neoplasm of left kidney, except renal pelvis: Secondary | ICD-10-CM | POA: Diagnosis not present

## 2016-12-21 DIAGNOSIS — S32010S Wedge compression fracture of first lumbar vertebra, sequela: Secondary | ICD-10-CM | POA: Diagnosis not present

## 2016-12-21 DIAGNOSIS — M24569 Contracture, unspecified knee: Secondary | ICD-10-CM | POA: Diagnosis not present

## 2016-12-21 DIAGNOSIS — M6281 Muscle weakness (generalized): Secondary | ICD-10-CM | POA: Diagnosis not present

## 2016-12-21 DIAGNOSIS — G309 Alzheimer's disease, unspecified: Secondary | ICD-10-CM | POA: Diagnosis not present

## 2016-12-21 DIAGNOSIS — R1312 Dysphagia, oropharyngeal phase: Secondary | ICD-10-CM | POA: Diagnosis not present

## 2016-12-21 DIAGNOSIS — M24562 Contracture, left knee: Secondary | ICD-10-CM | POA: Diagnosis not present

## 2016-12-21 DIAGNOSIS — M8580 Other specified disorders of bone density and structure, unspecified site: Secondary | ICD-10-CM | POA: Diagnosis not present

## 2016-12-21 DIAGNOSIS — R41841 Cognitive communication deficit: Secondary | ICD-10-CM | POA: Diagnosis not present

## 2016-12-21 DIAGNOSIS — M24561 Contracture, right knee: Secondary | ICD-10-CM | POA: Diagnosis not present

## 2016-12-21 DIAGNOSIS — I2510 Atherosclerotic heart disease of native coronary artery without angina pectoris: Secondary | ICD-10-CM | POA: Diagnosis not present

## 2017-01-02 DIAGNOSIS — I739 Peripheral vascular disease, unspecified: Secondary | ICD-10-CM | POA: Diagnosis not present

## 2017-01-02 DIAGNOSIS — L603 Nail dystrophy: Secondary | ICD-10-CM | POA: Diagnosis not present

## 2017-01-02 DIAGNOSIS — B351 Tinea unguium: Secondary | ICD-10-CM | POA: Diagnosis not present

## 2017-01-09 DIAGNOSIS — R1312 Dysphagia, oropharyngeal phase: Secondary | ICD-10-CM | POA: Diagnosis not present

## 2017-01-09 DIAGNOSIS — C642 Malignant neoplasm of left kidney, except renal pelvis: Secondary | ICD-10-CM | POA: Diagnosis not present

## 2017-01-09 DIAGNOSIS — M8580 Other specified disorders of bone density and structure, unspecified site: Secondary | ICD-10-CM | POA: Diagnosis not present

## 2017-01-09 DIAGNOSIS — G309 Alzheimer's disease, unspecified: Secondary | ICD-10-CM | POA: Diagnosis not present

## 2017-01-09 DIAGNOSIS — R41841 Cognitive communication deficit: Secondary | ICD-10-CM | POA: Diagnosis not present

## 2017-01-09 DIAGNOSIS — S32010S Wedge compression fracture of first lumbar vertebra, sequela: Secondary | ICD-10-CM | POA: Diagnosis not present

## 2017-01-09 DIAGNOSIS — I2510 Atherosclerotic heart disease of native coronary artery without angina pectoris: Secondary | ICD-10-CM | POA: Diagnosis not present

## 2017-01-09 DIAGNOSIS — M6281 Muscle weakness (generalized): Secondary | ICD-10-CM | POA: Diagnosis not present

## 2017-01-09 DIAGNOSIS — M24569 Contracture, unspecified knee: Secondary | ICD-10-CM | POA: Diagnosis not present

## 2017-01-10 DIAGNOSIS — R1312 Dysphagia, oropharyngeal phase: Secondary | ICD-10-CM | POA: Diagnosis not present

## 2017-01-10 DIAGNOSIS — M8580 Other specified disorders of bone density and structure, unspecified site: Secondary | ICD-10-CM | POA: Diagnosis not present

## 2017-01-10 DIAGNOSIS — S32010S Wedge compression fracture of first lumbar vertebra, sequela: Secondary | ICD-10-CM | POA: Diagnosis not present

## 2017-01-10 DIAGNOSIS — C642 Malignant neoplasm of left kidney, except renal pelvis: Secondary | ICD-10-CM | POA: Diagnosis not present

## 2017-01-10 DIAGNOSIS — I2510 Atherosclerotic heart disease of native coronary artery without angina pectoris: Secondary | ICD-10-CM | POA: Diagnosis not present

## 2017-01-10 DIAGNOSIS — M24569 Contracture, unspecified knee: Secondary | ICD-10-CM | POA: Diagnosis not present

## 2017-01-10 DIAGNOSIS — R41841 Cognitive communication deficit: Secondary | ICD-10-CM | POA: Diagnosis not present

## 2017-01-10 DIAGNOSIS — M6281 Muscle weakness (generalized): Secondary | ICD-10-CM | POA: Diagnosis not present

## 2017-01-10 DIAGNOSIS — G309 Alzheimer's disease, unspecified: Secondary | ICD-10-CM | POA: Diagnosis not present

## 2017-01-11 DIAGNOSIS — S32010S Wedge compression fracture of first lumbar vertebra, sequela: Secondary | ICD-10-CM | POA: Diagnosis not present

## 2017-01-11 DIAGNOSIS — C642 Malignant neoplasm of left kidney, except renal pelvis: Secondary | ICD-10-CM | POA: Diagnosis not present

## 2017-01-11 DIAGNOSIS — M24569 Contracture, unspecified knee: Secondary | ICD-10-CM | POA: Diagnosis not present

## 2017-01-11 DIAGNOSIS — M8580 Other specified disorders of bone density and structure, unspecified site: Secondary | ICD-10-CM | POA: Diagnosis not present

## 2017-01-11 DIAGNOSIS — R1312 Dysphagia, oropharyngeal phase: Secondary | ICD-10-CM | POA: Diagnosis not present

## 2017-01-11 DIAGNOSIS — I2510 Atherosclerotic heart disease of native coronary artery without angina pectoris: Secondary | ICD-10-CM | POA: Diagnosis not present

## 2017-01-11 DIAGNOSIS — G309 Alzheimer's disease, unspecified: Secondary | ICD-10-CM | POA: Diagnosis not present

## 2017-01-11 DIAGNOSIS — R41841 Cognitive communication deficit: Secondary | ICD-10-CM | POA: Diagnosis not present

## 2017-01-11 DIAGNOSIS — M6281 Muscle weakness (generalized): Secondary | ICD-10-CM | POA: Diagnosis not present

## 2017-01-14 DIAGNOSIS — R1312 Dysphagia, oropharyngeal phase: Secondary | ICD-10-CM | POA: Diagnosis not present

## 2017-01-14 DIAGNOSIS — M6281 Muscle weakness (generalized): Secondary | ICD-10-CM | POA: Diagnosis not present

## 2017-01-14 DIAGNOSIS — G309 Alzheimer's disease, unspecified: Secondary | ICD-10-CM | POA: Diagnosis not present

## 2017-01-14 DIAGNOSIS — R41841 Cognitive communication deficit: Secondary | ICD-10-CM | POA: Diagnosis not present

## 2017-01-14 DIAGNOSIS — M24569 Contracture, unspecified knee: Secondary | ICD-10-CM | POA: Diagnosis not present

## 2017-01-14 DIAGNOSIS — M8580 Other specified disorders of bone density and structure, unspecified site: Secondary | ICD-10-CM | POA: Diagnosis not present

## 2017-01-14 DIAGNOSIS — S32010S Wedge compression fracture of first lumbar vertebra, sequela: Secondary | ICD-10-CM | POA: Diagnosis not present

## 2017-01-14 DIAGNOSIS — C642 Malignant neoplasm of left kidney, except renal pelvis: Secondary | ICD-10-CM | POA: Diagnosis not present

## 2017-01-14 DIAGNOSIS — I2510 Atherosclerotic heart disease of native coronary artery without angina pectoris: Secondary | ICD-10-CM | POA: Diagnosis not present

## 2017-01-15 DIAGNOSIS — M6281 Muscle weakness (generalized): Secondary | ICD-10-CM | POA: Diagnosis not present

## 2017-01-15 DIAGNOSIS — G309 Alzheimer's disease, unspecified: Secondary | ICD-10-CM | POA: Diagnosis not present

## 2017-01-15 DIAGNOSIS — M8580 Other specified disorders of bone density and structure, unspecified site: Secondary | ICD-10-CM | POA: Diagnosis not present

## 2017-01-15 DIAGNOSIS — I2510 Atherosclerotic heart disease of native coronary artery without angina pectoris: Secondary | ICD-10-CM | POA: Diagnosis not present

## 2017-01-15 DIAGNOSIS — R41841 Cognitive communication deficit: Secondary | ICD-10-CM | POA: Diagnosis not present

## 2017-01-15 DIAGNOSIS — C642 Malignant neoplasm of left kidney, except renal pelvis: Secondary | ICD-10-CM | POA: Diagnosis not present

## 2017-01-15 DIAGNOSIS — M24569 Contracture, unspecified knee: Secondary | ICD-10-CM | POA: Diagnosis not present

## 2017-01-15 DIAGNOSIS — R1312 Dysphagia, oropharyngeal phase: Secondary | ICD-10-CM | POA: Diagnosis not present

## 2017-01-15 DIAGNOSIS — S32010S Wedge compression fracture of first lumbar vertebra, sequela: Secondary | ICD-10-CM | POA: Diagnosis not present

## 2017-01-16 DIAGNOSIS — R41841 Cognitive communication deficit: Secondary | ICD-10-CM | POA: Diagnosis not present

## 2017-01-16 DIAGNOSIS — I2510 Atherosclerotic heart disease of native coronary artery without angina pectoris: Secondary | ICD-10-CM | POA: Diagnosis not present

## 2017-01-16 DIAGNOSIS — S32010S Wedge compression fracture of first lumbar vertebra, sequela: Secondary | ICD-10-CM | POA: Diagnosis not present

## 2017-01-16 DIAGNOSIS — M8580 Other specified disorders of bone density and structure, unspecified site: Secondary | ICD-10-CM | POA: Diagnosis not present

## 2017-01-16 DIAGNOSIS — M24569 Contracture, unspecified knee: Secondary | ICD-10-CM | POA: Diagnosis not present

## 2017-01-16 DIAGNOSIS — M6281 Muscle weakness (generalized): Secondary | ICD-10-CM | POA: Diagnosis not present

## 2017-01-16 DIAGNOSIS — G309 Alzheimer's disease, unspecified: Secondary | ICD-10-CM | POA: Diagnosis not present

## 2017-01-16 DIAGNOSIS — C642 Malignant neoplasm of left kidney, except renal pelvis: Secondary | ICD-10-CM | POA: Diagnosis not present

## 2017-01-16 DIAGNOSIS — R1312 Dysphagia, oropharyngeal phase: Secondary | ICD-10-CM | POA: Diagnosis not present

## 2017-01-17 DIAGNOSIS — M6281 Muscle weakness (generalized): Secondary | ICD-10-CM | POA: Diagnosis not present

## 2017-01-17 DIAGNOSIS — C642 Malignant neoplasm of left kidney, except renal pelvis: Secondary | ICD-10-CM | POA: Diagnosis not present

## 2017-01-17 DIAGNOSIS — R1312 Dysphagia, oropharyngeal phase: Secondary | ICD-10-CM | POA: Diagnosis not present

## 2017-01-17 DIAGNOSIS — M8580 Other specified disorders of bone density and structure, unspecified site: Secondary | ICD-10-CM | POA: Diagnosis not present

## 2017-01-17 DIAGNOSIS — G309 Alzheimer's disease, unspecified: Secondary | ICD-10-CM | POA: Diagnosis not present

## 2017-01-17 DIAGNOSIS — M24569 Contracture, unspecified knee: Secondary | ICD-10-CM | POA: Diagnosis not present

## 2017-01-17 DIAGNOSIS — I2510 Atherosclerotic heart disease of native coronary artery without angina pectoris: Secondary | ICD-10-CM | POA: Diagnosis not present

## 2017-01-17 DIAGNOSIS — S32010S Wedge compression fracture of first lumbar vertebra, sequela: Secondary | ICD-10-CM | POA: Diagnosis not present

## 2017-01-17 DIAGNOSIS — R41841 Cognitive communication deficit: Secondary | ICD-10-CM | POA: Diagnosis not present

## 2017-01-18 DIAGNOSIS — S32010S Wedge compression fracture of first lumbar vertebra, sequela: Secondary | ICD-10-CM | POA: Diagnosis not present

## 2017-01-18 DIAGNOSIS — M6281 Muscle weakness (generalized): Secondary | ICD-10-CM | POA: Diagnosis not present

## 2017-01-18 DIAGNOSIS — R1312 Dysphagia, oropharyngeal phase: Secondary | ICD-10-CM | POA: Diagnosis not present

## 2017-01-18 DIAGNOSIS — M24569 Contracture, unspecified knee: Secondary | ICD-10-CM | POA: Diagnosis not present

## 2017-01-18 DIAGNOSIS — M8580 Other specified disorders of bone density and structure, unspecified site: Secondary | ICD-10-CM | POA: Diagnosis not present

## 2017-01-18 DIAGNOSIS — C642 Malignant neoplasm of left kidney, except renal pelvis: Secondary | ICD-10-CM | POA: Diagnosis not present

## 2017-01-18 DIAGNOSIS — G309 Alzheimer's disease, unspecified: Secondary | ICD-10-CM | POA: Diagnosis not present

## 2017-01-18 DIAGNOSIS — R41841 Cognitive communication deficit: Secondary | ICD-10-CM | POA: Diagnosis not present

## 2017-01-18 DIAGNOSIS — I2510 Atherosclerotic heart disease of native coronary artery without angina pectoris: Secondary | ICD-10-CM | POA: Diagnosis not present

## 2017-01-21 DIAGNOSIS — I2510 Atherosclerotic heart disease of native coronary artery without angina pectoris: Secondary | ICD-10-CM | POA: Diagnosis not present

## 2017-01-21 DIAGNOSIS — G309 Alzheimer's disease, unspecified: Secondary | ICD-10-CM | POA: Diagnosis not present

## 2017-01-21 DIAGNOSIS — M8580 Other specified disorders of bone density and structure, unspecified site: Secondary | ICD-10-CM | POA: Diagnosis not present

## 2017-01-21 DIAGNOSIS — M24569 Contracture, unspecified knee: Secondary | ICD-10-CM | POA: Diagnosis not present

## 2017-01-21 DIAGNOSIS — R41841 Cognitive communication deficit: Secondary | ICD-10-CM | POA: Diagnosis not present

## 2017-01-21 DIAGNOSIS — R1312 Dysphagia, oropharyngeal phase: Secondary | ICD-10-CM | POA: Diagnosis not present

## 2017-01-21 DIAGNOSIS — S32010S Wedge compression fracture of first lumbar vertebra, sequela: Secondary | ICD-10-CM | POA: Diagnosis not present

## 2017-01-21 DIAGNOSIS — M6281 Muscle weakness (generalized): Secondary | ICD-10-CM | POA: Diagnosis not present

## 2017-01-21 DIAGNOSIS — C642 Malignant neoplasm of left kidney, except renal pelvis: Secondary | ICD-10-CM | POA: Diagnosis not present

## 2017-01-22 DIAGNOSIS — I2510 Atherosclerotic heart disease of native coronary artery without angina pectoris: Secondary | ICD-10-CM | POA: Diagnosis not present

## 2017-01-22 DIAGNOSIS — M549 Dorsalgia, unspecified: Secondary | ICD-10-CM | POA: Diagnosis not present

## 2017-01-22 DIAGNOSIS — F0390 Unspecified dementia without behavioral disturbance: Secondary | ICD-10-CM | POA: Diagnosis not present

## 2017-01-22 DIAGNOSIS — S32010S Wedge compression fracture of first lumbar vertebra, sequela: Secondary | ICD-10-CM | POA: Diagnosis not present

## 2017-01-22 DIAGNOSIS — C642 Malignant neoplasm of left kidney, except renal pelvis: Secondary | ICD-10-CM | POA: Diagnosis not present

## 2017-01-22 DIAGNOSIS — D381 Neoplasm of uncertain behavior of trachea, bronchus and lung: Secondary | ICD-10-CM | POA: Diagnosis not present

## 2017-01-22 DIAGNOSIS — M6281 Muscle weakness (generalized): Secondary | ICD-10-CM | POA: Diagnosis not present

## 2017-01-22 DIAGNOSIS — R41841 Cognitive communication deficit: Secondary | ICD-10-CM | POA: Diagnosis not present

## 2017-01-22 DIAGNOSIS — I10 Essential (primary) hypertension: Secondary | ICD-10-CM | POA: Diagnosis not present

## 2017-01-22 DIAGNOSIS — M24569 Contracture, unspecified knee: Secondary | ICD-10-CM | POA: Diagnosis not present

## 2017-01-22 DIAGNOSIS — Z791 Long term (current) use of non-steroidal anti-inflammatories (NSAID): Secondary | ICD-10-CM | POA: Diagnosis not present

## 2017-01-22 DIAGNOSIS — E785 Hyperlipidemia, unspecified: Secondary | ICD-10-CM | POA: Diagnosis not present

## 2017-01-22 DIAGNOSIS — G309 Alzheimer's disease, unspecified: Secondary | ICD-10-CM | POA: Diagnosis not present

## 2017-01-22 DIAGNOSIS — M8580 Other specified disorders of bone density and structure, unspecified site: Secondary | ICD-10-CM | POA: Diagnosis not present

## 2017-01-22 DIAGNOSIS — D4100 Neoplasm of uncertain behavior of unspecified kidney: Secondary | ICD-10-CM | POA: Diagnosis not present

## 2017-01-22 DIAGNOSIS — R1312 Dysphagia, oropharyngeal phase: Secondary | ICD-10-CM | POA: Diagnosis not present

## 2017-01-23 DIAGNOSIS — G309 Alzheimer's disease, unspecified: Secondary | ICD-10-CM | POA: Diagnosis not present

## 2017-01-23 DIAGNOSIS — R531 Weakness: Secondary | ICD-10-CM | POA: Diagnosis not present

## 2017-01-23 DIAGNOSIS — M6281 Muscle weakness (generalized): Secondary | ICD-10-CM | POA: Diagnosis not present

## 2017-01-23 DIAGNOSIS — Z79899 Other long term (current) drug therapy: Secondary | ICD-10-CM | POA: Diagnosis not present

## 2017-01-23 DIAGNOSIS — I2510 Atherosclerotic heart disease of native coronary artery without angina pectoris: Secondary | ICD-10-CM | POA: Diagnosis not present

## 2017-01-23 DIAGNOSIS — M24569 Contracture, unspecified knee: Secondary | ICD-10-CM | POA: Diagnosis not present

## 2017-01-23 DIAGNOSIS — C642 Malignant neoplasm of left kidney, except renal pelvis: Secondary | ICD-10-CM | POA: Diagnosis not present

## 2017-01-23 DIAGNOSIS — S32010S Wedge compression fracture of first lumbar vertebra, sequela: Secondary | ICD-10-CM | POA: Diagnosis not present

## 2017-01-23 DIAGNOSIS — M8580 Other specified disorders of bone density and structure, unspecified site: Secondary | ICD-10-CM | POA: Diagnosis not present

## 2017-01-23 DIAGNOSIS — R41841 Cognitive communication deficit: Secondary | ICD-10-CM | POA: Diagnosis not present

## 2017-01-23 DIAGNOSIS — R1312 Dysphagia, oropharyngeal phase: Secondary | ICD-10-CM | POA: Diagnosis not present

## 2017-01-24 DIAGNOSIS — G309 Alzheimer's disease, unspecified: Secondary | ICD-10-CM | POA: Diagnosis not present

## 2017-01-24 DIAGNOSIS — C642 Malignant neoplasm of left kidney, except renal pelvis: Secondary | ICD-10-CM | POA: Diagnosis not present

## 2017-01-24 DIAGNOSIS — M24569 Contracture, unspecified knee: Secondary | ICD-10-CM | POA: Diagnosis not present

## 2017-01-24 DIAGNOSIS — S32010S Wedge compression fracture of first lumbar vertebra, sequela: Secondary | ICD-10-CM | POA: Diagnosis not present

## 2017-01-24 DIAGNOSIS — M6281 Muscle weakness (generalized): Secondary | ICD-10-CM | POA: Diagnosis not present

## 2017-01-24 DIAGNOSIS — R1312 Dysphagia, oropharyngeal phase: Secondary | ICD-10-CM | POA: Diagnosis not present

## 2017-01-24 DIAGNOSIS — I2510 Atherosclerotic heart disease of native coronary artery without angina pectoris: Secondary | ICD-10-CM | POA: Diagnosis not present

## 2017-01-24 DIAGNOSIS — M8580 Other specified disorders of bone density and structure, unspecified site: Secondary | ICD-10-CM | POA: Diagnosis not present

## 2017-01-24 DIAGNOSIS — R41841 Cognitive communication deficit: Secondary | ICD-10-CM | POA: Diagnosis not present

## 2017-01-25 DIAGNOSIS — M8580 Other specified disorders of bone density and structure, unspecified site: Secondary | ICD-10-CM | POA: Diagnosis not present

## 2017-01-25 DIAGNOSIS — G309 Alzheimer's disease, unspecified: Secondary | ICD-10-CM | POA: Diagnosis not present

## 2017-01-25 DIAGNOSIS — R41841 Cognitive communication deficit: Secondary | ICD-10-CM | POA: Diagnosis not present

## 2017-01-25 DIAGNOSIS — S32010S Wedge compression fracture of first lumbar vertebra, sequela: Secondary | ICD-10-CM | POA: Diagnosis not present

## 2017-01-25 DIAGNOSIS — M24569 Contracture, unspecified knee: Secondary | ICD-10-CM | POA: Diagnosis not present

## 2017-01-25 DIAGNOSIS — M6281 Muscle weakness (generalized): Secondary | ICD-10-CM | POA: Diagnosis not present

## 2017-01-25 DIAGNOSIS — R1312 Dysphagia, oropharyngeal phase: Secondary | ICD-10-CM | POA: Diagnosis not present

## 2017-01-25 DIAGNOSIS — C642 Malignant neoplasm of left kidney, except renal pelvis: Secondary | ICD-10-CM | POA: Diagnosis not present

## 2017-01-25 DIAGNOSIS — I2510 Atherosclerotic heart disease of native coronary artery without angina pectoris: Secondary | ICD-10-CM | POA: Diagnosis not present

## 2017-01-28 DIAGNOSIS — I2510 Atherosclerotic heart disease of native coronary artery without angina pectoris: Secondary | ICD-10-CM | POA: Diagnosis not present

## 2017-01-28 DIAGNOSIS — S32010S Wedge compression fracture of first lumbar vertebra, sequela: Secondary | ICD-10-CM | POA: Diagnosis not present

## 2017-01-28 DIAGNOSIS — M6281 Muscle weakness (generalized): Secondary | ICD-10-CM | POA: Diagnosis not present

## 2017-01-28 DIAGNOSIS — C642 Malignant neoplasm of left kidney, except renal pelvis: Secondary | ICD-10-CM | POA: Diagnosis not present

## 2017-01-28 DIAGNOSIS — M24569 Contracture, unspecified knee: Secondary | ICD-10-CM | POA: Diagnosis not present

## 2017-01-28 DIAGNOSIS — R1312 Dysphagia, oropharyngeal phase: Secondary | ICD-10-CM | POA: Diagnosis not present

## 2017-01-28 DIAGNOSIS — M8580 Other specified disorders of bone density and structure, unspecified site: Secondary | ICD-10-CM | POA: Diagnosis not present

## 2017-01-28 DIAGNOSIS — R41841 Cognitive communication deficit: Secondary | ICD-10-CM | POA: Diagnosis not present

## 2017-01-28 DIAGNOSIS — G309 Alzheimer's disease, unspecified: Secondary | ICD-10-CM | POA: Diagnosis not present

## 2017-01-29 DIAGNOSIS — I2510 Atherosclerotic heart disease of native coronary artery without angina pectoris: Secondary | ICD-10-CM | POA: Diagnosis not present

## 2017-01-29 DIAGNOSIS — M8580 Other specified disorders of bone density and structure, unspecified site: Secondary | ICD-10-CM | POA: Diagnosis not present

## 2017-01-29 DIAGNOSIS — M24569 Contracture, unspecified knee: Secondary | ICD-10-CM | POA: Diagnosis not present

## 2017-01-29 DIAGNOSIS — C642 Malignant neoplasm of left kidney, except renal pelvis: Secondary | ICD-10-CM | POA: Diagnosis not present

## 2017-01-29 DIAGNOSIS — M6281 Muscle weakness (generalized): Secondary | ICD-10-CM | POA: Diagnosis not present

## 2017-01-29 DIAGNOSIS — S32010S Wedge compression fracture of first lumbar vertebra, sequela: Secondary | ICD-10-CM | POA: Diagnosis not present

## 2017-01-29 DIAGNOSIS — R1312 Dysphagia, oropharyngeal phase: Secondary | ICD-10-CM | POA: Diagnosis not present

## 2017-01-29 DIAGNOSIS — R41841 Cognitive communication deficit: Secondary | ICD-10-CM | POA: Diagnosis not present

## 2017-01-29 DIAGNOSIS — G309 Alzheimer's disease, unspecified: Secondary | ICD-10-CM | POA: Diagnosis not present

## 2017-01-30 DIAGNOSIS — G309 Alzheimer's disease, unspecified: Secondary | ICD-10-CM | POA: Diagnosis not present

## 2017-01-30 DIAGNOSIS — R41841 Cognitive communication deficit: Secondary | ICD-10-CM | POA: Diagnosis not present

## 2017-01-30 DIAGNOSIS — C642 Malignant neoplasm of left kidney, except renal pelvis: Secondary | ICD-10-CM | POA: Diagnosis not present

## 2017-01-30 DIAGNOSIS — I2510 Atherosclerotic heart disease of native coronary artery without angina pectoris: Secondary | ICD-10-CM | POA: Diagnosis not present

## 2017-01-30 DIAGNOSIS — S32010S Wedge compression fracture of first lumbar vertebra, sequela: Secondary | ICD-10-CM | POA: Diagnosis not present

## 2017-01-30 DIAGNOSIS — M6281 Muscle weakness (generalized): Secondary | ICD-10-CM | POA: Diagnosis not present

## 2017-01-30 DIAGNOSIS — R1312 Dysphagia, oropharyngeal phase: Secondary | ICD-10-CM | POA: Diagnosis not present

## 2017-01-30 DIAGNOSIS — M8580 Other specified disorders of bone density and structure, unspecified site: Secondary | ICD-10-CM | POA: Diagnosis not present

## 2017-01-30 DIAGNOSIS — M24569 Contracture, unspecified knee: Secondary | ICD-10-CM | POA: Diagnosis not present

## 2017-01-31 DIAGNOSIS — I2510 Atherosclerotic heart disease of native coronary artery without angina pectoris: Secondary | ICD-10-CM | POA: Diagnosis not present

## 2017-01-31 DIAGNOSIS — G309 Alzheimer's disease, unspecified: Secondary | ICD-10-CM | POA: Diagnosis not present

## 2017-01-31 DIAGNOSIS — R1312 Dysphagia, oropharyngeal phase: Secondary | ICD-10-CM | POA: Diagnosis not present

## 2017-01-31 DIAGNOSIS — C642 Malignant neoplasm of left kidney, except renal pelvis: Secondary | ICD-10-CM | POA: Diagnosis not present

## 2017-01-31 DIAGNOSIS — S32010S Wedge compression fracture of first lumbar vertebra, sequela: Secondary | ICD-10-CM | POA: Diagnosis not present

## 2017-01-31 DIAGNOSIS — M24569 Contracture, unspecified knee: Secondary | ICD-10-CM | POA: Diagnosis not present

## 2017-01-31 DIAGNOSIS — R41841 Cognitive communication deficit: Secondary | ICD-10-CM | POA: Diagnosis not present

## 2017-01-31 DIAGNOSIS — M8580 Other specified disorders of bone density and structure, unspecified site: Secondary | ICD-10-CM | POA: Diagnosis not present

## 2017-01-31 DIAGNOSIS — M6281 Muscle weakness (generalized): Secondary | ICD-10-CM | POA: Diagnosis not present

## 2017-03-11 DIAGNOSIS — B351 Tinea unguium: Secondary | ICD-10-CM | POA: Diagnosis not present

## 2017-03-11 DIAGNOSIS — I739 Peripheral vascular disease, unspecified: Secondary | ICD-10-CM | POA: Diagnosis not present

## 2017-03-11 DIAGNOSIS — L84 Corns and callosities: Secondary | ICD-10-CM | POA: Diagnosis not present

## 2017-03-20 DIAGNOSIS — C642 Malignant neoplasm of left kidney, except renal pelvis: Secondary | ICD-10-CM | POA: Diagnosis not present

## 2017-03-20 DIAGNOSIS — S32010S Wedge compression fracture of first lumbar vertebra, sequela: Secondary | ICD-10-CM | POA: Diagnosis not present

## 2017-03-20 DIAGNOSIS — M24542 Contracture, left hand: Secondary | ICD-10-CM | POA: Diagnosis not present

## 2017-03-20 DIAGNOSIS — R1312 Dysphagia, oropharyngeal phase: Secondary | ICD-10-CM | POA: Diagnosis not present

## 2017-03-20 DIAGNOSIS — M8580 Other specified disorders of bone density and structure, unspecified site: Secondary | ICD-10-CM | POA: Diagnosis not present

## 2017-03-20 DIAGNOSIS — M6281 Muscle weakness (generalized): Secondary | ICD-10-CM | POA: Diagnosis not present

## 2017-03-20 DIAGNOSIS — G309 Alzheimer's disease, unspecified: Secondary | ICD-10-CM | POA: Diagnosis not present

## 2017-03-20 DIAGNOSIS — I2510 Atherosclerotic heart disease of native coronary artery without angina pectoris: Secondary | ICD-10-CM | POA: Diagnosis not present

## 2017-03-20 DIAGNOSIS — R41841 Cognitive communication deficit: Secondary | ICD-10-CM | POA: Diagnosis not present

## 2017-03-21 DIAGNOSIS — D381 Neoplasm of uncertain behavior of trachea, bronchus and lung: Secondary | ICD-10-CM | POA: Diagnosis not present

## 2017-03-21 DIAGNOSIS — D4100 Neoplasm of uncertain behavior of unspecified kidney: Secondary | ICD-10-CM | POA: Diagnosis not present

## 2017-03-21 DIAGNOSIS — Z791 Long term (current) use of non-steroidal anti-inflammatories (NSAID): Secondary | ICD-10-CM | POA: Diagnosis not present

## 2017-03-21 DIAGNOSIS — M549 Dorsalgia, unspecified: Secondary | ICD-10-CM | POA: Diagnosis not present

## 2017-03-21 DIAGNOSIS — E785 Hyperlipidemia, unspecified: Secondary | ICD-10-CM | POA: Diagnosis not present

## 2017-03-21 DIAGNOSIS — I10 Essential (primary) hypertension: Secondary | ICD-10-CM | POA: Diagnosis not present

## 2017-03-21 DIAGNOSIS — F0390 Unspecified dementia without behavioral disturbance: Secondary | ICD-10-CM | POA: Diagnosis not present

## 2017-03-22 DIAGNOSIS — S32010S Wedge compression fracture of first lumbar vertebra, sequela: Secondary | ICD-10-CM | POA: Diagnosis not present

## 2017-03-22 DIAGNOSIS — I2510 Atherosclerotic heart disease of native coronary artery without angina pectoris: Secondary | ICD-10-CM | POA: Diagnosis not present

## 2017-03-22 DIAGNOSIS — G309 Alzheimer's disease, unspecified: Secondary | ICD-10-CM | POA: Diagnosis not present

## 2017-03-22 DIAGNOSIS — M6281 Muscle weakness (generalized): Secondary | ICD-10-CM | POA: Diagnosis not present

## 2017-03-22 DIAGNOSIS — M8580 Other specified disorders of bone density and structure, unspecified site: Secondary | ICD-10-CM | POA: Diagnosis not present

## 2017-03-22 DIAGNOSIS — R41841 Cognitive communication deficit: Secondary | ICD-10-CM | POA: Diagnosis not present

## 2017-03-22 DIAGNOSIS — R1312 Dysphagia, oropharyngeal phase: Secondary | ICD-10-CM | POA: Diagnosis not present

## 2017-03-22 DIAGNOSIS — C642 Malignant neoplasm of left kidney, except renal pelvis: Secondary | ICD-10-CM | POA: Diagnosis not present

## 2017-03-22 DIAGNOSIS — M24542 Contracture, left hand: Secondary | ICD-10-CM | POA: Diagnosis not present

## 2017-03-24 DIAGNOSIS — R1312 Dysphagia, oropharyngeal phase: Secondary | ICD-10-CM | POA: Diagnosis not present

## 2017-03-24 DIAGNOSIS — M6281 Muscle weakness (generalized): Secondary | ICD-10-CM | POA: Diagnosis not present

## 2017-03-24 DIAGNOSIS — C642 Malignant neoplasm of left kidney, except renal pelvis: Secondary | ICD-10-CM | POA: Diagnosis not present

## 2017-03-24 DIAGNOSIS — I2510 Atherosclerotic heart disease of native coronary artery without angina pectoris: Secondary | ICD-10-CM | POA: Diagnosis not present

## 2017-03-24 DIAGNOSIS — M8580 Other specified disorders of bone density and structure, unspecified site: Secondary | ICD-10-CM | POA: Diagnosis not present

## 2017-03-24 DIAGNOSIS — G309 Alzheimer's disease, unspecified: Secondary | ICD-10-CM | POA: Diagnosis not present

## 2017-03-24 DIAGNOSIS — R41841 Cognitive communication deficit: Secondary | ICD-10-CM | POA: Diagnosis not present

## 2017-03-24 DIAGNOSIS — M24542 Contracture, left hand: Secondary | ICD-10-CM | POA: Diagnosis not present

## 2017-03-24 DIAGNOSIS — S32010S Wedge compression fracture of first lumbar vertebra, sequela: Secondary | ICD-10-CM | POA: Diagnosis not present

## 2017-03-25 DIAGNOSIS — I2510 Atherosclerotic heart disease of native coronary artery without angina pectoris: Secondary | ICD-10-CM | POA: Diagnosis not present

## 2017-03-25 DIAGNOSIS — C642 Malignant neoplasm of left kidney, except renal pelvis: Secondary | ICD-10-CM | POA: Diagnosis not present

## 2017-03-25 DIAGNOSIS — R41841 Cognitive communication deficit: Secondary | ICD-10-CM | POA: Diagnosis not present

## 2017-03-25 DIAGNOSIS — M8580 Other specified disorders of bone density and structure, unspecified site: Secondary | ICD-10-CM | POA: Diagnosis not present

## 2017-03-25 DIAGNOSIS — S32010S Wedge compression fracture of first lumbar vertebra, sequela: Secondary | ICD-10-CM | POA: Diagnosis not present

## 2017-03-25 DIAGNOSIS — M24542 Contracture, left hand: Secondary | ICD-10-CM | POA: Diagnosis not present

## 2017-03-25 DIAGNOSIS — G309 Alzheimer's disease, unspecified: Secondary | ICD-10-CM | POA: Diagnosis not present

## 2017-03-25 DIAGNOSIS — R1312 Dysphagia, oropharyngeal phase: Secondary | ICD-10-CM | POA: Diagnosis not present

## 2017-03-25 DIAGNOSIS — M6281 Muscle weakness (generalized): Secondary | ICD-10-CM | POA: Diagnosis not present

## 2017-03-26 DIAGNOSIS — R41841 Cognitive communication deficit: Secondary | ICD-10-CM | POA: Diagnosis not present

## 2017-03-26 DIAGNOSIS — G309 Alzheimer's disease, unspecified: Secondary | ICD-10-CM | POA: Diagnosis not present

## 2017-03-26 DIAGNOSIS — M8580 Other specified disorders of bone density and structure, unspecified site: Secondary | ICD-10-CM | POA: Diagnosis not present

## 2017-03-26 DIAGNOSIS — M24542 Contracture, left hand: Secondary | ICD-10-CM | POA: Diagnosis not present

## 2017-03-26 DIAGNOSIS — M6281 Muscle weakness (generalized): Secondary | ICD-10-CM | POA: Diagnosis not present

## 2017-03-26 DIAGNOSIS — R1312 Dysphagia, oropharyngeal phase: Secondary | ICD-10-CM | POA: Diagnosis not present

## 2017-03-26 DIAGNOSIS — S32010S Wedge compression fracture of first lumbar vertebra, sequela: Secondary | ICD-10-CM | POA: Diagnosis not present

## 2017-03-26 DIAGNOSIS — I2510 Atherosclerotic heart disease of native coronary artery without angina pectoris: Secondary | ICD-10-CM | POA: Diagnosis not present

## 2017-03-26 DIAGNOSIS — C642 Malignant neoplasm of left kidney, except renal pelvis: Secondary | ICD-10-CM | POA: Diagnosis not present

## 2017-03-27 DIAGNOSIS — R1312 Dysphagia, oropharyngeal phase: Secondary | ICD-10-CM | POA: Diagnosis not present

## 2017-03-27 DIAGNOSIS — M8580 Other specified disorders of bone density and structure, unspecified site: Secondary | ICD-10-CM | POA: Diagnosis not present

## 2017-03-27 DIAGNOSIS — R41841 Cognitive communication deficit: Secondary | ICD-10-CM | POA: Diagnosis not present

## 2017-03-27 DIAGNOSIS — G309 Alzheimer's disease, unspecified: Secondary | ICD-10-CM | POA: Diagnosis not present

## 2017-03-27 DIAGNOSIS — I2510 Atherosclerotic heart disease of native coronary artery without angina pectoris: Secondary | ICD-10-CM | POA: Diagnosis not present

## 2017-03-27 DIAGNOSIS — M24542 Contracture, left hand: Secondary | ICD-10-CM | POA: Diagnosis not present

## 2017-03-27 DIAGNOSIS — S32010S Wedge compression fracture of first lumbar vertebra, sequela: Secondary | ICD-10-CM | POA: Diagnosis not present

## 2017-03-27 DIAGNOSIS — M6281 Muscle weakness (generalized): Secondary | ICD-10-CM | POA: Diagnosis not present

## 2017-03-27 DIAGNOSIS — C642 Malignant neoplasm of left kidney, except renal pelvis: Secondary | ICD-10-CM | POA: Diagnosis not present

## 2017-03-28 DIAGNOSIS — R1312 Dysphagia, oropharyngeal phase: Secondary | ICD-10-CM | POA: Diagnosis not present

## 2017-03-28 DIAGNOSIS — S32010S Wedge compression fracture of first lumbar vertebra, sequela: Secondary | ICD-10-CM | POA: Diagnosis not present

## 2017-03-28 DIAGNOSIS — C642 Malignant neoplasm of left kidney, except renal pelvis: Secondary | ICD-10-CM | POA: Diagnosis not present

## 2017-03-28 DIAGNOSIS — G309 Alzheimer's disease, unspecified: Secondary | ICD-10-CM | POA: Diagnosis not present

## 2017-03-28 DIAGNOSIS — M24542 Contracture, left hand: Secondary | ICD-10-CM | POA: Diagnosis not present

## 2017-03-28 DIAGNOSIS — M8580 Other specified disorders of bone density and structure, unspecified site: Secondary | ICD-10-CM | POA: Diagnosis not present

## 2017-03-28 DIAGNOSIS — R41841 Cognitive communication deficit: Secondary | ICD-10-CM | POA: Diagnosis not present

## 2017-03-28 DIAGNOSIS — I2510 Atherosclerotic heart disease of native coronary artery without angina pectoris: Secondary | ICD-10-CM | POA: Diagnosis not present

## 2017-03-28 DIAGNOSIS — M6281 Muscle weakness (generalized): Secondary | ICD-10-CM | POA: Diagnosis not present

## 2017-03-29 DIAGNOSIS — I2510 Atherosclerotic heart disease of native coronary artery without angina pectoris: Secondary | ICD-10-CM | POA: Diagnosis not present

## 2017-03-29 DIAGNOSIS — S32010S Wedge compression fracture of first lumbar vertebra, sequela: Secondary | ICD-10-CM | POA: Diagnosis not present

## 2017-03-29 DIAGNOSIS — M8580 Other specified disorders of bone density and structure, unspecified site: Secondary | ICD-10-CM | POA: Diagnosis not present

## 2017-03-29 DIAGNOSIS — R1312 Dysphagia, oropharyngeal phase: Secondary | ICD-10-CM | POA: Diagnosis not present

## 2017-03-29 DIAGNOSIS — M24542 Contracture, left hand: Secondary | ICD-10-CM | POA: Diagnosis not present

## 2017-03-29 DIAGNOSIS — M6281 Muscle weakness (generalized): Secondary | ICD-10-CM | POA: Diagnosis not present

## 2017-03-29 DIAGNOSIS — C642 Malignant neoplasm of left kidney, except renal pelvis: Secondary | ICD-10-CM | POA: Diagnosis not present

## 2017-03-29 DIAGNOSIS — G309 Alzheimer's disease, unspecified: Secondary | ICD-10-CM | POA: Diagnosis not present

## 2017-03-29 DIAGNOSIS — R41841 Cognitive communication deficit: Secondary | ICD-10-CM | POA: Diagnosis not present

## 2017-04-01 DIAGNOSIS — G309 Alzheimer's disease, unspecified: Secondary | ICD-10-CM | POA: Diagnosis not present

## 2017-04-01 DIAGNOSIS — I2510 Atherosclerotic heart disease of native coronary artery without angina pectoris: Secondary | ICD-10-CM | POA: Diagnosis not present

## 2017-04-01 DIAGNOSIS — S32010S Wedge compression fracture of first lumbar vertebra, sequela: Secondary | ICD-10-CM | POA: Diagnosis not present

## 2017-04-01 DIAGNOSIS — M24542 Contracture, left hand: Secondary | ICD-10-CM | POA: Diagnosis not present

## 2017-04-01 DIAGNOSIS — R41841 Cognitive communication deficit: Secondary | ICD-10-CM | POA: Diagnosis not present

## 2017-04-01 DIAGNOSIS — M6281 Muscle weakness (generalized): Secondary | ICD-10-CM | POA: Diagnosis not present

## 2017-04-01 DIAGNOSIS — C642 Malignant neoplasm of left kidney, except renal pelvis: Secondary | ICD-10-CM | POA: Diagnosis not present

## 2017-04-01 DIAGNOSIS — R1312 Dysphagia, oropharyngeal phase: Secondary | ICD-10-CM | POA: Diagnosis not present

## 2017-04-01 DIAGNOSIS — M8580 Other specified disorders of bone density and structure, unspecified site: Secondary | ICD-10-CM | POA: Diagnosis not present

## 2017-04-02 DIAGNOSIS — R1312 Dysphagia, oropharyngeal phase: Secondary | ICD-10-CM | POA: Diagnosis not present

## 2017-04-02 DIAGNOSIS — M6281 Muscle weakness (generalized): Secondary | ICD-10-CM | POA: Diagnosis not present

## 2017-04-02 DIAGNOSIS — G309 Alzheimer's disease, unspecified: Secondary | ICD-10-CM | POA: Diagnosis not present

## 2017-04-02 DIAGNOSIS — S32010S Wedge compression fracture of first lumbar vertebra, sequela: Secondary | ICD-10-CM | POA: Diagnosis not present

## 2017-04-02 DIAGNOSIS — I2510 Atherosclerotic heart disease of native coronary artery without angina pectoris: Secondary | ICD-10-CM | POA: Diagnosis not present

## 2017-04-02 DIAGNOSIS — M8580 Other specified disorders of bone density and structure, unspecified site: Secondary | ICD-10-CM | POA: Diagnosis not present

## 2017-04-02 DIAGNOSIS — R41841 Cognitive communication deficit: Secondary | ICD-10-CM | POA: Diagnosis not present

## 2017-04-02 DIAGNOSIS — M24542 Contracture, left hand: Secondary | ICD-10-CM | POA: Diagnosis not present

## 2017-04-02 DIAGNOSIS — C642 Malignant neoplasm of left kidney, except renal pelvis: Secondary | ICD-10-CM | POA: Diagnosis not present

## 2017-04-03 DIAGNOSIS — C642 Malignant neoplasm of left kidney, except renal pelvis: Secondary | ICD-10-CM | POA: Diagnosis not present

## 2017-04-03 DIAGNOSIS — G309 Alzheimer's disease, unspecified: Secondary | ICD-10-CM | POA: Diagnosis not present

## 2017-04-03 DIAGNOSIS — R41841 Cognitive communication deficit: Secondary | ICD-10-CM | POA: Diagnosis not present

## 2017-04-03 DIAGNOSIS — S32010S Wedge compression fracture of first lumbar vertebra, sequela: Secondary | ICD-10-CM | POA: Diagnosis not present

## 2017-04-03 DIAGNOSIS — I2510 Atherosclerotic heart disease of native coronary artery without angina pectoris: Secondary | ICD-10-CM | POA: Diagnosis not present

## 2017-04-03 DIAGNOSIS — M8580 Other specified disorders of bone density and structure, unspecified site: Secondary | ICD-10-CM | POA: Diagnosis not present

## 2017-04-03 DIAGNOSIS — M24542 Contracture, left hand: Secondary | ICD-10-CM | POA: Diagnosis not present

## 2017-04-03 DIAGNOSIS — M6281 Muscle weakness (generalized): Secondary | ICD-10-CM | POA: Diagnosis not present

## 2017-04-03 DIAGNOSIS — R1312 Dysphagia, oropharyngeal phase: Secondary | ICD-10-CM | POA: Diagnosis not present

## 2017-04-04 DIAGNOSIS — M6281 Muscle weakness (generalized): Secondary | ICD-10-CM | POA: Diagnosis not present

## 2017-04-04 DIAGNOSIS — C642 Malignant neoplasm of left kidney, except renal pelvis: Secondary | ICD-10-CM | POA: Diagnosis not present

## 2017-04-04 DIAGNOSIS — R1312 Dysphagia, oropharyngeal phase: Secondary | ICD-10-CM | POA: Diagnosis not present

## 2017-04-04 DIAGNOSIS — M24542 Contracture, left hand: Secondary | ICD-10-CM | POA: Diagnosis not present

## 2017-04-04 DIAGNOSIS — M8580 Other specified disorders of bone density and structure, unspecified site: Secondary | ICD-10-CM | POA: Diagnosis not present

## 2017-04-04 DIAGNOSIS — I2510 Atherosclerotic heart disease of native coronary artery without angina pectoris: Secondary | ICD-10-CM | POA: Diagnosis not present

## 2017-04-04 DIAGNOSIS — S32010S Wedge compression fracture of first lumbar vertebra, sequela: Secondary | ICD-10-CM | POA: Diagnosis not present

## 2017-04-04 DIAGNOSIS — R41841 Cognitive communication deficit: Secondary | ICD-10-CM | POA: Diagnosis not present

## 2017-04-04 DIAGNOSIS — G309 Alzheimer's disease, unspecified: Secondary | ICD-10-CM | POA: Diagnosis not present

## 2017-04-05 DIAGNOSIS — G309 Alzheimer's disease, unspecified: Secondary | ICD-10-CM | POA: Diagnosis not present

## 2017-04-05 DIAGNOSIS — S32010S Wedge compression fracture of first lumbar vertebra, sequela: Secondary | ICD-10-CM | POA: Diagnosis not present

## 2017-04-05 DIAGNOSIS — I2510 Atherosclerotic heart disease of native coronary artery without angina pectoris: Secondary | ICD-10-CM | POA: Diagnosis not present

## 2017-04-05 DIAGNOSIS — C642 Malignant neoplasm of left kidney, except renal pelvis: Secondary | ICD-10-CM | POA: Diagnosis not present

## 2017-04-05 DIAGNOSIS — M6281 Muscle weakness (generalized): Secondary | ICD-10-CM | POA: Diagnosis not present

## 2017-04-05 DIAGNOSIS — R1312 Dysphagia, oropharyngeal phase: Secondary | ICD-10-CM | POA: Diagnosis not present

## 2017-04-05 DIAGNOSIS — R41841 Cognitive communication deficit: Secondary | ICD-10-CM | POA: Diagnosis not present

## 2017-04-05 DIAGNOSIS — M8580 Other specified disorders of bone density and structure, unspecified site: Secondary | ICD-10-CM | POA: Diagnosis not present

## 2017-04-05 DIAGNOSIS — M24542 Contracture, left hand: Secondary | ICD-10-CM | POA: Diagnosis not present

## 2017-04-08 DIAGNOSIS — C642 Malignant neoplasm of left kidney, except renal pelvis: Secondary | ICD-10-CM | POA: Diagnosis not present

## 2017-04-08 DIAGNOSIS — G309 Alzheimer's disease, unspecified: Secondary | ICD-10-CM | POA: Diagnosis not present

## 2017-04-08 DIAGNOSIS — S32010S Wedge compression fracture of first lumbar vertebra, sequela: Secondary | ICD-10-CM | POA: Diagnosis not present

## 2017-04-08 DIAGNOSIS — M24542 Contracture, left hand: Secondary | ICD-10-CM | POA: Diagnosis not present

## 2017-04-08 DIAGNOSIS — M8580 Other specified disorders of bone density and structure, unspecified site: Secondary | ICD-10-CM | POA: Diagnosis not present

## 2017-04-08 DIAGNOSIS — M6281 Muscle weakness (generalized): Secondary | ICD-10-CM | POA: Diagnosis not present

## 2017-04-08 DIAGNOSIS — I2510 Atherosclerotic heart disease of native coronary artery without angina pectoris: Secondary | ICD-10-CM | POA: Diagnosis not present

## 2017-04-08 DIAGNOSIS — R1312 Dysphagia, oropharyngeal phase: Secondary | ICD-10-CM | POA: Diagnosis not present

## 2017-04-08 DIAGNOSIS — R41841 Cognitive communication deficit: Secondary | ICD-10-CM | POA: Diagnosis not present

## 2017-04-09 DIAGNOSIS — I2510 Atherosclerotic heart disease of native coronary artery without angina pectoris: Secondary | ICD-10-CM | POA: Diagnosis not present

## 2017-04-09 DIAGNOSIS — G309 Alzheimer's disease, unspecified: Secondary | ICD-10-CM | POA: Diagnosis not present

## 2017-04-09 DIAGNOSIS — R41841 Cognitive communication deficit: Secondary | ICD-10-CM | POA: Diagnosis not present

## 2017-04-09 DIAGNOSIS — S32010S Wedge compression fracture of first lumbar vertebra, sequela: Secondary | ICD-10-CM | POA: Diagnosis not present

## 2017-04-09 DIAGNOSIS — M6281 Muscle weakness (generalized): Secondary | ICD-10-CM | POA: Diagnosis not present

## 2017-04-09 DIAGNOSIS — C642 Malignant neoplasm of left kidney, except renal pelvis: Secondary | ICD-10-CM | POA: Diagnosis not present

## 2017-04-09 DIAGNOSIS — M24542 Contracture, left hand: Secondary | ICD-10-CM | POA: Diagnosis not present

## 2017-04-09 DIAGNOSIS — M8580 Other specified disorders of bone density and structure, unspecified site: Secondary | ICD-10-CM | POA: Diagnosis not present

## 2017-04-09 DIAGNOSIS — R1312 Dysphagia, oropharyngeal phase: Secondary | ICD-10-CM | POA: Diagnosis not present

## 2017-04-10 DIAGNOSIS — S32010S Wedge compression fracture of first lumbar vertebra, sequela: Secondary | ICD-10-CM | POA: Diagnosis not present

## 2017-04-10 DIAGNOSIS — M6281 Muscle weakness (generalized): Secondary | ICD-10-CM | POA: Diagnosis not present

## 2017-04-10 DIAGNOSIS — G309 Alzheimer's disease, unspecified: Secondary | ICD-10-CM | POA: Diagnosis not present

## 2017-04-10 DIAGNOSIS — C642 Malignant neoplasm of left kidney, except renal pelvis: Secondary | ICD-10-CM | POA: Diagnosis not present

## 2017-04-10 DIAGNOSIS — I2510 Atherosclerotic heart disease of native coronary artery without angina pectoris: Secondary | ICD-10-CM | POA: Diagnosis not present

## 2017-04-10 DIAGNOSIS — M24542 Contracture, left hand: Secondary | ICD-10-CM | POA: Diagnosis not present

## 2017-04-10 DIAGNOSIS — R41841 Cognitive communication deficit: Secondary | ICD-10-CM | POA: Diagnosis not present

## 2017-04-10 DIAGNOSIS — R1312 Dysphagia, oropharyngeal phase: Secondary | ICD-10-CM | POA: Diagnosis not present

## 2017-04-10 DIAGNOSIS — M8580 Other specified disorders of bone density and structure, unspecified site: Secondary | ICD-10-CM | POA: Diagnosis not present

## 2017-04-11 DIAGNOSIS — M8580 Other specified disorders of bone density and structure, unspecified site: Secondary | ICD-10-CM | POA: Diagnosis not present

## 2017-04-11 DIAGNOSIS — M6281 Muscle weakness (generalized): Secondary | ICD-10-CM | POA: Diagnosis not present

## 2017-04-11 DIAGNOSIS — M24542 Contracture, left hand: Secondary | ICD-10-CM | POA: Diagnosis not present

## 2017-04-11 DIAGNOSIS — C642 Malignant neoplasm of left kidney, except renal pelvis: Secondary | ICD-10-CM | POA: Diagnosis not present

## 2017-04-11 DIAGNOSIS — S32010S Wedge compression fracture of first lumbar vertebra, sequela: Secondary | ICD-10-CM | POA: Diagnosis not present

## 2017-04-11 DIAGNOSIS — I2510 Atherosclerotic heart disease of native coronary artery without angina pectoris: Secondary | ICD-10-CM | POA: Diagnosis not present

## 2017-04-11 DIAGNOSIS — R41841 Cognitive communication deficit: Secondary | ICD-10-CM | POA: Diagnosis not present

## 2017-04-11 DIAGNOSIS — G309 Alzheimer's disease, unspecified: Secondary | ICD-10-CM | POA: Diagnosis not present

## 2017-04-11 DIAGNOSIS — R1312 Dysphagia, oropharyngeal phase: Secondary | ICD-10-CM | POA: Diagnosis not present

## 2017-04-12 DIAGNOSIS — R1312 Dysphagia, oropharyngeal phase: Secondary | ICD-10-CM | POA: Diagnosis not present

## 2017-04-12 DIAGNOSIS — M24562 Contracture, left knee: Secondary | ICD-10-CM | POA: Diagnosis not present

## 2017-04-12 DIAGNOSIS — S32010S Wedge compression fracture of first lumbar vertebra, sequela: Secondary | ICD-10-CM | POA: Diagnosis not present

## 2017-04-12 DIAGNOSIS — I10 Essential (primary) hypertension: Secondary | ICD-10-CM | POA: Diagnosis not present

## 2017-04-12 DIAGNOSIS — R41841 Cognitive communication deficit: Secondary | ICD-10-CM | POA: Diagnosis not present

## 2017-04-12 DIAGNOSIS — M24542 Contracture, left hand: Secondary | ICD-10-CM | POA: Diagnosis not present

## 2017-04-12 DIAGNOSIS — I2510 Atherosclerotic heart disease of native coronary artery without angina pectoris: Secondary | ICD-10-CM | POA: Diagnosis not present

## 2017-04-12 DIAGNOSIS — C642 Malignant neoplasm of left kidney, except renal pelvis: Secondary | ICD-10-CM | POA: Diagnosis not present

## 2017-04-12 DIAGNOSIS — M24561 Contracture, right knee: Secondary | ICD-10-CM | POA: Diagnosis not present

## 2017-04-13 DIAGNOSIS — M24561 Contracture, right knee: Secondary | ICD-10-CM | POA: Diagnosis not present

## 2017-04-13 DIAGNOSIS — M24562 Contracture, left knee: Secondary | ICD-10-CM | POA: Diagnosis not present

## 2017-04-13 DIAGNOSIS — M24542 Contracture, left hand: Secondary | ICD-10-CM | POA: Diagnosis not present

## 2017-04-15 DIAGNOSIS — M24561 Contracture, right knee: Secondary | ICD-10-CM | POA: Diagnosis not present

## 2017-04-15 DIAGNOSIS — C642 Malignant neoplasm of left kidney, except renal pelvis: Secondary | ICD-10-CM | POA: Diagnosis not present

## 2017-04-15 DIAGNOSIS — I2510 Atherosclerotic heart disease of native coronary artery without angina pectoris: Secondary | ICD-10-CM | POA: Diagnosis not present

## 2017-04-15 DIAGNOSIS — I10 Essential (primary) hypertension: Secondary | ICD-10-CM | POA: Diagnosis not present

## 2017-04-15 DIAGNOSIS — R1312 Dysphagia, oropharyngeal phase: Secondary | ICD-10-CM | POA: Diagnosis not present

## 2017-04-15 DIAGNOSIS — M24542 Contracture, left hand: Secondary | ICD-10-CM | POA: Diagnosis not present

## 2017-04-15 DIAGNOSIS — S32010S Wedge compression fracture of first lumbar vertebra, sequela: Secondary | ICD-10-CM | POA: Diagnosis not present

## 2017-04-15 DIAGNOSIS — R41841 Cognitive communication deficit: Secondary | ICD-10-CM | POA: Diagnosis not present

## 2017-04-15 DIAGNOSIS — M24562 Contracture, left knee: Secondary | ICD-10-CM | POA: Diagnosis not present

## 2017-04-16 DIAGNOSIS — R1312 Dysphagia, oropharyngeal phase: Secondary | ICD-10-CM | POA: Diagnosis not present

## 2017-04-16 DIAGNOSIS — M24561 Contracture, right knee: Secondary | ICD-10-CM | POA: Diagnosis not present

## 2017-04-16 DIAGNOSIS — I10 Essential (primary) hypertension: Secondary | ICD-10-CM | POA: Diagnosis not present

## 2017-04-16 DIAGNOSIS — M24562 Contracture, left knee: Secondary | ICD-10-CM | POA: Diagnosis not present

## 2017-04-16 DIAGNOSIS — C642 Malignant neoplasm of left kidney, except renal pelvis: Secondary | ICD-10-CM | POA: Diagnosis not present

## 2017-04-16 DIAGNOSIS — M24542 Contracture, left hand: Secondary | ICD-10-CM | POA: Diagnosis not present

## 2017-04-16 DIAGNOSIS — R41841 Cognitive communication deficit: Secondary | ICD-10-CM | POA: Diagnosis not present

## 2017-04-16 DIAGNOSIS — S32010S Wedge compression fracture of first lumbar vertebra, sequela: Secondary | ICD-10-CM | POA: Diagnosis not present

## 2017-04-16 DIAGNOSIS — I2510 Atherosclerotic heart disease of native coronary artery without angina pectoris: Secondary | ICD-10-CM | POA: Diagnosis not present

## 2017-04-25 DIAGNOSIS — I10 Essential (primary) hypertension: Secondary | ICD-10-CM | POA: Diagnosis not present

## 2017-04-25 DIAGNOSIS — I2510 Atherosclerotic heart disease of native coronary artery without angina pectoris: Secondary | ICD-10-CM | POA: Diagnosis not present

## 2017-04-25 DIAGNOSIS — R1312 Dysphagia, oropharyngeal phase: Secondary | ICD-10-CM | POA: Diagnosis not present

## 2017-04-25 DIAGNOSIS — M24562 Contracture, left knee: Secondary | ICD-10-CM | POA: Diagnosis not present

## 2017-04-25 DIAGNOSIS — M24542 Contracture, left hand: Secondary | ICD-10-CM | POA: Diagnosis not present

## 2017-04-25 DIAGNOSIS — C642 Malignant neoplasm of left kidney, except renal pelvis: Secondary | ICD-10-CM | POA: Diagnosis not present

## 2017-04-25 DIAGNOSIS — M24561 Contracture, right knee: Secondary | ICD-10-CM | POA: Diagnosis not present

## 2017-04-25 DIAGNOSIS — S32010S Wedge compression fracture of first lumbar vertebra, sequela: Secondary | ICD-10-CM | POA: Diagnosis not present

## 2017-04-25 DIAGNOSIS — R41841 Cognitive communication deficit: Secondary | ICD-10-CM | POA: Diagnosis not present

## 2017-04-26 DIAGNOSIS — S32010S Wedge compression fracture of first lumbar vertebra, sequela: Secondary | ICD-10-CM | POA: Diagnosis not present

## 2017-04-26 DIAGNOSIS — C642 Malignant neoplasm of left kidney, except renal pelvis: Secondary | ICD-10-CM | POA: Diagnosis not present

## 2017-04-26 DIAGNOSIS — I2510 Atherosclerotic heart disease of native coronary artery without angina pectoris: Secondary | ICD-10-CM | POA: Diagnosis not present

## 2017-04-26 DIAGNOSIS — M24542 Contracture, left hand: Secondary | ICD-10-CM | POA: Diagnosis not present

## 2017-04-26 DIAGNOSIS — I10 Essential (primary) hypertension: Secondary | ICD-10-CM | POA: Diagnosis not present

## 2017-04-26 DIAGNOSIS — M24561 Contracture, right knee: Secondary | ICD-10-CM | POA: Diagnosis not present

## 2017-04-26 DIAGNOSIS — R41841 Cognitive communication deficit: Secondary | ICD-10-CM | POA: Diagnosis not present

## 2017-04-26 DIAGNOSIS — M24562 Contracture, left knee: Secondary | ICD-10-CM | POA: Diagnosis not present

## 2017-04-26 DIAGNOSIS — R1312 Dysphagia, oropharyngeal phase: Secondary | ICD-10-CM | POA: Diagnosis not present

## 2017-04-30 DIAGNOSIS — I2510 Atherosclerotic heart disease of native coronary artery without angina pectoris: Secondary | ICD-10-CM | POA: Diagnosis not present

## 2017-04-30 DIAGNOSIS — S32010S Wedge compression fracture of first lumbar vertebra, sequela: Secondary | ICD-10-CM | POA: Diagnosis not present

## 2017-04-30 DIAGNOSIS — C642 Malignant neoplasm of left kidney, except renal pelvis: Secondary | ICD-10-CM | POA: Diagnosis not present

## 2017-04-30 DIAGNOSIS — M24542 Contracture, left hand: Secondary | ICD-10-CM | POA: Diagnosis not present

## 2017-04-30 DIAGNOSIS — M24561 Contracture, right knee: Secondary | ICD-10-CM | POA: Diagnosis not present

## 2017-04-30 DIAGNOSIS — R1312 Dysphagia, oropharyngeal phase: Secondary | ICD-10-CM | POA: Diagnosis not present

## 2017-04-30 DIAGNOSIS — R41841 Cognitive communication deficit: Secondary | ICD-10-CM | POA: Diagnosis not present

## 2017-04-30 DIAGNOSIS — I10 Essential (primary) hypertension: Secondary | ICD-10-CM | POA: Diagnosis not present

## 2017-04-30 DIAGNOSIS — M24562 Contracture, left knee: Secondary | ICD-10-CM | POA: Diagnosis not present

## 2017-05-01 DIAGNOSIS — S32010S Wedge compression fracture of first lumbar vertebra, sequela: Secondary | ICD-10-CM | POA: Diagnosis not present

## 2017-05-01 DIAGNOSIS — I10 Essential (primary) hypertension: Secondary | ICD-10-CM | POA: Diagnosis not present

## 2017-05-01 DIAGNOSIS — I2510 Atherosclerotic heart disease of native coronary artery without angina pectoris: Secondary | ICD-10-CM | POA: Diagnosis not present

## 2017-05-01 DIAGNOSIS — R41841 Cognitive communication deficit: Secondary | ICD-10-CM | POA: Diagnosis not present

## 2017-05-01 DIAGNOSIS — M24562 Contracture, left knee: Secondary | ICD-10-CM | POA: Diagnosis not present

## 2017-05-01 DIAGNOSIS — M24542 Contracture, left hand: Secondary | ICD-10-CM | POA: Diagnosis not present

## 2017-05-01 DIAGNOSIS — R1312 Dysphagia, oropharyngeal phase: Secondary | ICD-10-CM | POA: Diagnosis not present

## 2017-05-01 DIAGNOSIS — C642 Malignant neoplasm of left kidney, except renal pelvis: Secondary | ICD-10-CM | POA: Diagnosis not present

## 2017-05-01 DIAGNOSIS — M24561 Contracture, right knee: Secondary | ICD-10-CM | POA: Diagnosis not present

## 2017-05-02 DIAGNOSIS — M24562 Contracture, left knee: Secondary | ICD-10-CM | POA: Diagnosis not present

## 2017-05-02 DIAGNOSIS — C642 Malignant neoplasm of left kidney, except renal pelvis: Secondary | ICD-10-CM | POA: Diagnosis not present

## 2017-05-02 DIAGNOSIS — R41841 Cognitive communication deficit: Secondary | ICD-10-CM | POA: Diagnosis not present

## 2017-05-02 DIAGNOSIS — R1312 Dysphagia, oropharyngeal phase: Secondary | ICD-10-CM | POA: Diagnosis not present

## 2017-05-02 DIAGNOSIS — M24561 Contracture, right knee: Secondary | ICD-10-CM | POA: Diagnosis not present

## 2017-05-02 DIAGNOSIS — M24542 Contracture, left hand: Secondary | ICD-10-CM | POA: Diagnosis not present

## 2017-05-02 DIAGNOSIS — I10 Essential (primary) hypertension: Secondary | ICD-10-CM | POA: Diagnosis not present

## 2017-05-02 DIAGNOSIS — I2510 Atherosclerotic heart disease of native coronary artery without angina pectoris: Secondary | ICD-10-CM | POA: Diagnosis not present

## 2017-05-02 DIAGNOSIS — S32010S Wedge compression fracture of first lumbar vertebra, sequela: Secondary | ICD-10-CM | POA: Diagnosis not present

## 2017-05-03 DIAGNOSIS — R41841 Cognitive communication deficit: Secondary | ICD-10-CM | POA: Diagnosis not present

## 2017-05-03 DIAGNOSIS — M24542 Contracture, left hand: Secondary | ICD-10-CM | POA: Diagnosis not present

## 2017-05-03 DIAGNOSIS — I10 Essential (primary) hypertension: Secondary | ICD-10-CM | POA: Diagnosis not present

## 2017-05-03 DIAGNOSIS — C642 Malignant neoplasm of left kidney, except renal pelvis: Secondary | ICD-10-CM | POA: Diagnosis not present

## 2017-05-03 DIAGNOSIS — M24562 Contracture, left knee: Secondary | ICD-10-CM | POA: Diagnosis not present

## 2017-05-03 DIAGNOSIS — R1312 Dysphagia, oropharyngeal phase: Secondary | ICD-10-CM | POA: Diagnosis not present

## 2017-05-03 DIAGNOSIS — I2510 Atherosclerotic heart disease of native coronary artery without angina pectoris: Secondary | ICD-10-CM | POA: Diagnosis not present

## 2017-05-03 DIAGNOSIS — S32010S Wedge compression fracture of first lumbar vertebra, sequela: Secondary | ICD-10-CM | POA: Diagnosis not present

## 2017-05-03 DIAGNOSIS — M24561 Contracture, right knee: Secondary | ICD-10-CM | POA: Diagnosis not present

## 2017-05-06 DIAGNOSIS — I2510 Atherosclerotic heart disease of native coronary artery without angina pectoris: Secondary | ICD-10-CM | POA: Diagnosis not present

## 2017-05-06 DIAGNOSIS — C642 Malignant neoplasm of left kidney, except renal pelvis: Secondary | ICD-10-CM | POA: Diagnosis not present

## 2017-05-06 DIAGNOSIS — M24542 Contracture, left hand: Secondary | ICD-10-CM | POA: Diagnosis not present

## 2017-05-06 DIAGNOSIS — R41841 Cognitive communication deficit: Secondary | ICD-10-CM | POA: Diagnosis not present

## 2017-05-06 DIAGNOSIS — R1312 Dysphagia, oropharyngeal phase: Secondary | ICD-10-CM | POA: Diagnosis not present

## 2017-05-06 DIAGNOSIS — M24562 Contracture, left knee: Secondary | ICD-10-CM | POA: Diagnosis not present

## 2017-05-06 DIAGNOSIS — I10 Essential (primary) hypertension: Secondary | ICD-10-CM | POA: Diagnosis not present

## 2017-05-06 DIAGNOSIS — S32010S Wedge compression fracture of first lumbar vertebra, sequela: Secondary | ICD-10-CM | POA: Diagnosis not present

## 2017-05-06 DIAGNOSIS — M24561 Contracture, right knee: Secondary | ICD-10-CM | POA: Diagnosis not present

## 2017-05-07 DIAGNOSIS — I2510 Atherosclerotic heart disease of native coronary artery without angina pectoris: Secondary | ICD-10-CM | POA: Diagnosis not present

## 2017-05-07 DIAGNOSIS — I10 Essential (primary) hypertension: Secondary | ICD-10-CM | POA: Diagnosis not present

## 2017-05-07 DIAGNOSIS — R41841 Cognitive communication deficit: Secondary | ICD-10-CM | POA: Diagnosis not present

## 2017-05-07 DIAGNOSIS — M24542 Contracture, left hand: Secondary | ICD-10-CM | POA: Diagnosis not present

## 2017-05-07 DIAGNOSIS — C642 Malignant neoplasm of left kidney, except renal pelvis: Secondary | ICD-10-CM | POA: Diagnosis not present

## 2017-05-07 DIAGNOSIS — S32010S Wedge compression fracture of first lumbar vertebra, sequela: Secondary | ICD-10-CM | POA: Diagnosis not present

## 2017-05-07 DIAGNOSIS — M24562 Contracture, left knee: Secondary | ICD-10-CM | POA: Diagnosis not present

## 2017-05-07 DIAGNOSIS — R1312 Dysphagia, oropharyngeal phase: Secondary | ICD-10-CM | POA: Diagnosis not present

## 2017-05-07 DIAGNOSIS — M24561 Contracture, right knee: Secondary | ICD-10-CM | POA: Diagnosis not present

## 2017-05-08 DIAGNOSIS — S32010S Wedge compression fracture of first lumbar vertebra, sequela: Secondary | ICD-10-CM | POA: Diagnosis not present

## 2017-05-08 DIAGNOSIS — I10 Essential (primary) hypertension: Secondary | ICD-10-CM | POA: Diagnosis not present

## 2017-05-08 DIAGNOSIS — M24561 Contracture, right knee: Secondary | ICD-10-CM | POA: Diagnosis not present

## 2017-05-08 DIAGNOSIS — I2510 Atherosclerotic heart disease of native coronary artery without angina pectoris: Secondary | ICD-10-CM | POA: Diagnosis not present

## 2017-05-08 DIAGNOSIS — R1312 Dysphagia, oropharyngeal phase: Secondary | ICD-10-CM | POA: Diagnosis not present

## 2017-05-08 DIAGNOSIS — M24562 Contracture, left knee: Secondary | ICD-10-CM | POA: Diagnosis not present

## 2017-05-08 DIAGNOSIS — C642 Malignant neoplasm of left kidney, except renal pelvis: Secondary | ICD-10-CM | POA: Diagnosis not present

## 2017-05-08 DIAGNOSIS — R41841 Cognitive communication deficit: Secondary | ICD-10-CM | POA: Diagnosis not present

## 2017-05-08 DIAGNOSIS — M24542 Contracture, left hand: Secondary | ICD-10-CM | POA: Diagnosis not present

## 2017-05-21 DIAGNOSIS — R634 Abnormal weight loss: Secondary | ICD-10-CM | POA: Diagnosis not present

## 2017-05-21 DIAGNOSIS — M549 Dorsalgia, unspecified: Secondary | ICD-10-CM | POA: Diagnosis not present

## 2017-05-21 DIAGNOSIS — D381 Neoplasm of uncertain behavior of trachea, bronchus and lung: Secondary | ICD-10-CM | POA: Diagnosis not present

## 2017-05-21 DIAGNOSIS — E785 Hyperlipidemia, unspecified: Secondary | ICD-10-CM | POA: Diagnosis not present

## 2017-05-21 DIAGNOSIS — I10 Essential (primary) hypertension: Secondary | ICD-10-CM | POA: Diagnosis not present

## 2017-05-21 DIAGNOSIS — F0390 Unspecified dementia without behavioral disturbance: Secondary | ICD-10-CM | POA: Diagnosis not present

## 2017-06-10 DIAGNOSIS — B351 Tinea unguium: Secondary | ICD-10-CM | POA: Diagnosis not present

## 2017-06-10 DIAGNOSIS — I739 Peripheral vascular disease, unspecified: Secondary | ICD-10-CM | POA: Diagnosis not present

## 2017-06-12 DIAGNOSIS — R600 Localized edema: Secondary | ICD-10-CM | POA: Diagnosis not present

## 2017-06-12 DIAGNOSIS — M79642 Pain in left hand: Secondary | ICD-10-CM | POA: Diagnosis not present

## 2017-06-13 DIAGNOSIS — M2550 Pain in unspecified joint: Secondary | ICD-10-CM | POA: Diagnosis not present

## 2017-06-24 DIAGNOSIS — M8580 Other specified disorders of bone density and structure, unspecified site: Secondary | ICD-10-CM | POA: Diagnosis not present

## 2017-06-24 DIAGNOSIS — I2510 Atherosclerotic heart disease of native coronary artery without angina pectoris: Secondary | ICD-10-CM | POA: Diagnosis not present

## 2017-06-24 DIAGNOSIS — E785 Hyperlipidemia, unspecified: Secondary | ICD-10-CM | POA: Diagnosis not present

## 2017-06-24 DIAGNOSIS — G309 Alzheimer's disease, unspecified: Secondary | ICD-10-CM | POA: Diagnosis not present

## 2017-06-24 DIAGNOSIS — M6281 Muscle weakness (generalized): Secondary | ICD-10-CM | POA: Diagnosis not present

## 2017-06-24 DIAGNOSIS — I10 Essential (primary) hypertension: Secondary | ICD-10-CM | POA: Diagnosis not present

## 2017-06-24 DIAGNOSIS — R1312 Dysphagia, oropharyngeal phase: Secondary | ICD-10-CM | POA: Diagnosis not present

## 2017-06-24 DIAGNOSIS — C642 Malignant neoplasm of left kidney, except renal pelvis: Secondary | ICD-10-CM | POA: Diagnosis not present

## 2017-06-25 DIAGNOSIS — I2510 Atherosclerotic heart disease of native coronary artery without angina pectoris: Secondary | ICD-10-CM | POA: Diagnosis not present

## 2017-06-25 DIAGNOSIS — M6281 Muscle weakness (generalized): Secondary | ICD-10-CM | POA: Diagnosis not present

## 2017-06-25 DIAGNOSIS — M8580 Other specified disorders of bone density and structure, unspecified site: Secondary | ICD-10-CM | POA: Diagnosis not present

## 2017-06-25 DIAGNOSIS — I10 Essential (primary) hypertension: Secondary | ICD-10-CM | POA: Diagnosis not present

## 2017-06-25 DIAGNOSIS — R1312 Dysphagia, oropharyngeal phase: Secondary | ICD-10-CM | POA: Diagnosis not present

## 2017-06-25 DIAGNOSIS — E785 Hyperlipidemia, unspecified: Secondary | ICD-10-CM | POA: Diagnosis not present

## 2017-06-25 DIAGNOSIS — C642 Malignant neoplasm of left kidney, except renal pelvis: Secondary | ICD-10-CM | POA: Diagnosis not present

## 2017-06-25 DIAGNOSIS — G309 Alzheimer's disease, unspecified: Secondary | ICD-10-CM | POA: Diagnosis not present

## 2017-06-26 DIAGNOSIS — R1312 Dysphagia, oropharyngeal phase: Secondary | ICD-10-CM | POA: Diagnosis not present

## 2017-06-26 DIAGNOSIS — G309 Alzheimer's disease, unspecified: Secondary | ICD-10-CM | POA: Diagnosis not present

## 2017-06-26 DIAGNOSIS — I2510 Atherosclerotic heart disease of native coronary artery without angina pectoris: Secondary | ICD-10-CM | POA: Diagnosis not present

## 2017-06-26 DIAGNOSIS — I10 Essential (primary) hypertension: Secondary | ICD-10-CM | POA: Diagnosis not present

## 2017-06-26 DIAGNOSIS — M8580 Other specified disorders of bone density and structure, unspecified site: Secondary | ICD-10-CM | POA: Diagnosis not present

## 2017-06-26 DIAGNOSIS — E785 Hyperlipidemia, unspecified: Secondary | ICD-10-CM | POA: Diagnosis not present

## 2017-06-26 DIAGNOSIS — C642 Malignant neoplasm of left kidney, except renal pelvis: Secondary | ICD-10-CM | POA: Diagnosis not present

## 2017-06-26 DIAGNOSIS — M6281 Muscle weakness (generalized): Secondary | ICD-10-CM | POA: Diagnosis not present

## 2017-06-27 DIAGNOSIS — R1312 Dysphagia, oropharyngeal phase: Secondary | ICD-10-CM | POA: Diagnosis not present

## 2017-06-27 DIAGNOSIS — I10 Essential (primary) hypertension: Secondary | ICD-10-CM | POA: Diagnosis not present

## 2017-06-27 DIAGNOSIS — M6281 Muscle weakness (generalized): Secondary | ICD-10-CM | POA: Diagnosis not present

## 2017-06-27 DIAGNOSIS — E785 Hyperlipidemia, unspecified: Secondary | ICD-10-CM | POA: Diagnosis not present

## 2017-06-27 DIAGNOSIS — I2510 Atherosclerotic heart disease of native coronary artery without angina pectoris: Secondary | ICD-10-CM | POA: Diagnosis not present

## 2017-06-27 DIAGNOSIS — C642 Malignant neoplasm of left kidney, except renal pelvis: Secondary | ICD-10-CM | POA: Diagnosis not present

## 2017-06-27 DIAGNOSIS — M8580 Other specified disorders of bone density and structure, unspecified site: Secondary | ICD-10-CM | POA: Diagnosis not present

## 2017-06-27 DIAGNOSIS — G309 Alzheimer's disease, unspecified: Secondary | ICD-10-CM | POA: Diagnosis not present

## 2017-06-28 DIAGNOSIS — C642 Malignant neoplasm of left kidney, except renal pelvis: Secondary | ICD-10-CM | POA: Diagnosis not present

## 2017-06-28 DIAGNOSIS — M8580 Other specified disorders of bone density and structure, unspecified site: Secondary | ICD-10-CM | POA: Diagnosis not present

## 2017-06-28 DIAGNOSIS — M6281 Muscle weakness (generalized): Secondary | ICD-10-CM | POA: Diagnosis not present

## 2017-06-28 DIAGNOSIS — I2510 Atherosclerotic heart disease of native coronary artery without angina pectoris: Secondary | ICD-10-CM | POA: Diagnosis not present

## 2017-06-28 DIAGNOSIS — E785 Hyperlipidemia, unspecified: Secondary | ICD-10-CM | POA: Diagnosis not present

## 2017-06-28 DIAGNOSIS — I10 Essential (primary) hypertension: Secondary | ICD-10-CM | POA: Diagnosis not present

## 2017-06-28 DIAGNOSIS — R1312 Dysphagia, oropharyngeal phase: Secondary | ICD-10-CM | POA: Diagnosis not present

## 2017-06-28 DIAGNOSIS — G309 Alzheimer's disease, unspecified: Secondary | ICD-10-CM | POA: Diagnosis not present

## 2017-07-01 DIAGNOSIS — C642 Malignant neoplasm of left kidney, except renal pelvis: Secondary | ICD-10-CM | POA: Diagnosis not present

## 2017-07-01 DIAGNOSIS — M8580 Other specified disorders of bone density and structure, unspecified site: Secondary | ICD-10-CM | POA: Diagnosis not present

## 2017-07-01 DIAGNOSIS — M6281 Muscle weakness (generalized): Secondary | ICD-10-CM | POA: Diagnosis not present

## 2017-07-01 DIAGNOSIS — G309 Alzheimer's disease, unspecified: Secondary | ICD-10-CM | POA: Diagnosis not present

## 2017-07-01 DIAGNOSIS — R1312 Dysphagia, oropharyngeal phase: Secondary | ICD-10-CM | POA: Diagnosis not present

## 2017-07-01 DIAGNOSIS — E785 Hyperlipidemia, unspecified: Secondary | ICD-10-CM | POA: Diagnosis not present

## 2017-07-01 DIAGNOSIS — I10 Essential (primary) hypertension: Secondary | ICD-10-CM | POA: Diagnosis not present

## 2017-07-01 DIAGNOSIS — I2510 Atherosclerotic heart disease of native coronary artery without angina pectoris: Secondary | ICD-10-CM | POA: Diagnosis not present

## 2017-07-02 DIAGNOSIS — E785 Hyperlipidemia, unspecified: Secondary | ICD-10-CM | POA: Diagnosis not present

## 2017-07-02 DIAGNOSIS — M6281 Muscle weakness (generalized): Secondary | ICD-10-CM | POA: Diagnosis not present

## 2017-07-02 DIAGNOSIS — M8580 Other specified disorders of bone density and structure, unspecified site: Secondary | ICD-10-CM | POA: Diagnosis not present

## 2017-07-02 DIAGNOSIS — C642 Malignant neoplasm of left kidney, except renal pelvis: Secondary | ICD-10-CM | POA: Diagnosis not present

## 2017-07-02 DIAGNOSIS — I2510 Atherosclerotic heart disease of native coronary artery without angina pectoris: Secondary | ICD-10-CM | POA: Diagnosis not present

## 2017-07-02 DIAGNOSIS — G309 Alzheimer's disease, unspecified: Secondary | ICD-10-CM | POA: Diagnosis not present

## 2017-07-02 DIAGNOSIS — R1312 Dysphagia, oropharyngeal phase: Secondary | ICD-10-CM | POA: Diagnosis not present

## 2017-07-02 DIAGNOSIS — I10 Essential (primary) hypertension: Secondary | ICD-10-CM | POA: Diagnosis not present

## 2017-07-03 DIAGNOSIS — M6281 Muscle weakness (generalized): Secondary | ICD-10-CM | POA: Diagnosis not present

## 2017-07-03 DIAGNOSIS — R1312 Dysphagia, oropharyngeal phase: Secondary | ICD-10-CM | POA: Diagnosis not present

## 2017-07-03 DIAGNOSIS — M8580 Other specified disorders of bone density and structure, unspecified site: Secondary | ICD-10-CM | POA: Diagnosis not present

## 2017-07-03 DIAGNOSIS — C642 Malignant neoplasm of left kidney, except renal pelvis: Secondary | ICD-10-CM | POA: Diagnosis not present

## 2017-07-03 DIAGNOSIS — E785 Hyperlipidemia, unspecified: Secondary | ICD-10-CM | POA: Diagnosis not present

## 2017-07-03 DIAGNOSIS — I10 Essential (primary) hypertension: Secondary | ICD-10-CM | POA: Diagnosis not present

## 2017-07-03 DIAGNOSIS — I2510 Atherosclerotic heart disease of native coronary artery without angina pectoris: Secondary | ICD-10-CM | POA: Diagnosis not present

## 2017-07-03 DIAGNOSIS — G309 Alzheimer's disease, unspecified: Secondary | ICD-10-CM | POA: Diagnosis not present

## 2017-07-04 DIAGNOSIS — R1312 Dysphagia, oropharyngeal phase: Secondary | ICD-10-CM | POA: Diagnosis not present

## 2017-07-04 DIAGNOSIS — G309 Alzheimer's disease, unspecified: Secondary | ICD-10-CM | POA: Diagnosis not present

## 2017-07-04 DIAGNOSIS — M6281 Muscle weakness (generalized): Secondary | ICD-10-CM | POA: Diagnosis not present

## 2017-07-04 DIAGNOSIS — C642 Malignant neoplasm of left kidney, except renal pelvis: Secondary | ICD-10-CM | POA: Diagnosis not present

## 2017-07-04 DIAGNOSIS — M8580 Other specified disorders of bone density and structure, unspecified site: Secondary | ICD-10-CM | POA: Diagnosis not present

## 2017-07-04 DIAGNOSIS — E785 Hyperlipidemia, unspecified: Secondary | ICD-10-CM | POA: Diagnosis not present

## 2017-07-04 DIAGNOSIS — I2510 Atherosclerotic heart disease of native coronary artery without angina pectoris: Secondary | ICD-10-CM | POA: Diagnosis not present

## 2017-07-04 DIAGNOSIS — I10 Essential (primary) hypertension: Secondary | ICD-10-CM | POA: Diagnosis not present

## 2017-07-05 DIAGNOSIS — I10 Essential (primary) hypertension: Secondary | ICD-10-CM | POA: Diagnosis not present

## 2017-07-05 DIAGNOSIS — C642 Malignant neoplasm of left kidney, except renal pelvis: Secondary | ICD-10-CM | POA: Diagnosis not present

## 2017-07-05 DIAGNOSIS — G309 Alzheimer's disease, unspecified: Secondary | ICD-10-CM | POA: Diagnosis not present

## 2017-07-05 DIAGNOSIS — R1312 Dysphagia, oropharyngeal phase: Secondary | ICD-10-CM | POA: Diagnosis not present

## 2017-07-05 DIAGNOSIS — E785 Hyperlipidemia, unspecified: Secondary | ICD-10-CM | POA: Diagnosis not present

## 2017-07-05 DIAGNOSIS — M6281 Muscle weakness (generalized): Secondary | ICD-10-CM | POA: Diagnosis not present

## 2017-07-05 DIAGNOSIS — I2510 Atherosclerotic heart disease of native coronary artery without angina pectoris: Secondary | ICD-10-CM | POA: Diagnosis not present

## 2017-07-05 DIAGNOSIS — M8580 Other specified disorders of bone density and structure, unspecified site: Secondary | ICD-10-CM | POA: Diagnosis not present

## 2017-07-08 DIAGNOSIS — C642 Malignant neoplasm of left kidney, except renal pelvis: Secondary | ICD-10-CM | POA: Diagnosis not present

## 2017-07-08 DIAGNOSIS — I2510 Atherosclerotic heart disease of native coronary artery without angina pectoris: Secondary | ICD-10-CM | POA: Diagnosis not present

## 2017-07-08 DIAGNOSIS — M6281 Muscle weakness (generalized): Secondary | ICD-10-CM | POA: Diagnosis not present

## 2017-07-08 DIAGNOSIS — M8580 Other specified disorders of bone density and structure, unspecified site: Secondary | ICD-10-CM | POA: Diagnosis not present

## 2017-07-08 DIAGNOSIS — R1312 Dysphagia, oropharyngeal phase: Secondary | ICD-10-CM | POA: Diagnosis not present

## 2017-07-08 DIAGNOSIS — G309 Alzheimer's disease, unspecified: Secondary | ICD-10-CM | POA: Diagnosis not present

## 2017-07-08 DIAGNOSIS — E785 Hyperlipidemia, unspecified: Secondary | ICD-10-CM | POA: Diagnosis not present

## 2017-07-08 DIAGNOSIS — I10 Essential (primary) hypertension: Secondary | ICD-10-CM | POA: Diagnosis not present

## 2017-07-09 DIAGNOSIS — M8580 Other specified disorders of bone density and structure, unspecified site: Secondary | ICD-10-CM | POA: Diagnosis not present

## 2017-07-09 DIAGNOSIS — I10 Essential (primary) hypertension: Secondary | ICD-10-CM | POA: Diagnosis not present

## 2017-07-09 DIAGNOSIS — G309 Alzheimer's disease, unspecified: Secondary | ICD-10-CM | POA: Diagnosis not present

## 2017-07-09 DIAGNOSIS — E785 Hyperlipidemia, unspecified: Secondary | ICD-10-CM | POA: Diagnosis not present

## 2017-07-09 DIAGNOSIS — M6281 Muscle weakness (generalized): Secondary | ICD-10-CM | POA: Diagnosis not present

## 2017-07-09 DIAGNOSIS — C642 Malignant neoplasm of left kidney, except renal pelvis: Secondary | ICD-10-CM | POA: Diagnosis not present

## 2017-07-09 DIAGNOSIS — I2510 Atherosclerotic heart disease of native coronary artery without angina pectoris: Secondary | ICD-10-CM | POA: Diagnosis not present

## 2017-07-09 DIAGNOSIS — R1312 Dysphagia, oropharyngeal phase: Secondary | ICD-10-CM | POA: Diagnosis not present

## 2017-07-10 DIAGNOSIS — E785 Hyperlipidemia, unspecified: Secondary | ICD-10-CM | POA: Diagnosis not present

## 2017-07-10 DIAGNOSIS — C642 Malignant neoplasm of left kidney, except renal pelvis: Secondary | ICD-10-CM | POA: Diagnosis not present

## 2017-07-10 DIAGNOSIS — I10 Essential (primary) hypertension: Secondary | ICD-10-CM | POA: Diagnosis not present

## 2017-07-10 DIAGNOSIS — M6281 Muscle weakness (generalized): Secondary | ICD-10-CM | POA: Diagnosis not present

## 2017-07-10 DIAGNOSIS — R1312 Dysphagia, oropharyngeal phase: Secondary | ICD-10-CM | POA: Diagnosis not present

## 2017-07-10 DIAGNOSIS — G309 Alzheimer's disease, unspecified: Secondary | ICD-10-CM | POA: Diagnosis not present

## 2017-07-10 DIAGNOSIS — I2510 Atherosclerotic heart disease of native coronary artery without angina pectoris: Secondary | ICD-10-CM | POA: Diagnosis not present

## 2017-07-10 DIAGNOSIS — M8580 Other specified disorders of bone density and structure, unspecified site: Secondary | ICD-10-CM | POA: Diagnosis not present

## 2017-07-11 DIAGNOSIS — M6281 Muscle weakness (generalized): Secondary | ICD-10-CM | POA: Diagnosis not present

## 2017-07-11 DIAGNOSIS — M8580 Other specified disorders of bone density and structure, unspecified site: Secondary | ICD-10-CM | POA: Diagnosis not present

## 2017-07-11 DIAGNOSIS — I10 Essential (primary) hypertension: Secondary | ICD-10-CM | POA: Diagnosis not present

## 2017-07-11 DIAGNOSIS — E785 Hyperlipidemia, unspecified: Secondary | ICD-10-CM | POA: Diagnosis not present

## 2017-07-11 DIAGNOSIS — R1312 Dysphagia, oropharyngeal phase: Secondary | ICD-10-CM | POA: Diagnosis not present

## 2017-07-11 DIAGNOSIS — I2510 Atherosclerotic heart disease of native coronary artery without angina pectoris: Secondary | ICD-10-CM | POA: Diagnosis not present

## 2017-07-11 DIAGNOSIS — C642 Malignant neoplasm of left kidney, except renal pelvis: Secondary | ICD-10-CM | POA: Diagnosis not present

## 2017-07-11 DIAGNOSIS — G309 Alzheimer's disease, unspecified: Secondary | ICD-10-CM | POA: Diagnosis not present

## 2017-07-12 DIAGNOSIS — I10 Essential (primary) hypertension: Secondary | ICD-10-CM | POA: Diagnosis not present

## 2017-07-12 DIAGNOSIS — E785 Hyperlipidemia, unspecified: Secondary | ICD-10-CM | POA: Diagnosis not present

## 2017-07-12 DIAGNOSIS — R1312 Dysphagia, oropharyngeal phase: Secondary | ICD-10-CM | POA: Diagnosis not present

## 2017-07-12 DIAGNOSIS — M6281 Muscle weakness (generalized): Secondary | ICD-10-CM | POA: Diagnosis not present

## 2017-07-12 DIAGNOSIS — C642 Malignant neoplasm of left kidney, except renal pelvis: Secondary | ICD-10-CM | POA: Diagnosis not present

## 2017-07-12 DIAGNOSIS — G309 Alzheimer's disease, unspecified: Secondary | ICD-10-CM | POA: Diagnosis not present

## 2017-07-12 DIAGNOSIS — I2510 Atherosclerotic heart disease of native coronary artery without angina pectoris: Secondary | ICD-10-CM | POA: Diagnosis not present

## 2017-07-12 DIAGNOSIS — M8580 Other specified disorders of bone density and structure, unspecified site: Secondary | ICD-10-CM | POA: Diagnosis not present

## 2017-07-15 DIAGNOSIS — C642 Malignant neoplasm of left kidney, except renal pelvis: Secondary | ICD-10-CM | POA: Diagnosis not present

## 2017-07-15 DIAGNOSIS — M8580 Other specified disorders of bone density and structure, unspecified site: Secondary | ICD-10-CM | POA: Diagnosis not present

## 2017-07-15 DIAGNOSIS — G309 Alzheimer's disease, unspecified: Secondary | ICD-10-CM | POA: Diagnosis not present

## 2017-07-15 DIAGNOSIS — I10 Essential (primary) hypertension: Secondary | ICD-10-CM | POA: Diagnosis not present

## 2017-07-15 DIAGNOSIS — M6281 Muscle weakness (generalized): Secondary | ICD-10-CM | POA: Diagnosis not present

## 2017-07-15 DIAGNOSIS — R1312 Dysphagia, oropharyngeal phase: Secondary | ICD-10-CM | POA: Diagnosis not present

## 2017-07-15 DIAGNOSIS — I2510 Atherosclerotic heart disease of native coronary artery without angina pectoris: Secondary | ICD-10-CM | POA: Diagnosis not present

## 2017-07-15 DIAGNOSIS — E785 Hyperlipidemia, unspecified: Secondary | ICD-10-CM | POA: Diagnosis not present

## 2017-07-16 ENCOUNTER — Telehealth: Payer: Self-pay

## 2017-07-16 DIAGNOSIS — M8580 Other specified disorders of bone density and structure, unspecified site: Secondary | ICD-10-CM | POA: Diagnosis not present

## 2017-07-16 DIAGNOSIS — G309 Alzheimer's disease, unspecified: Secondary | ICD-10-CM | POA: Diagnosis not present

## 2017-07-16 DIAGNOSIS — C642 Malignant neoplasm of left kidney, except renal pelvis: Secondary | ICD-10-CM | POA: Diagnosis not present

## 2017-07-16 DIAGNOSIS — I2510 Atherosclerotic heart disease of native coronary artery without angina pectoris: Secondary | ICD-10-CM | POA: Diagnosis not present

## 2017-07-16 DIAGNOSIS — I10 Essential (primary) hypertension: Secondary | ICD-10-CM | POA: Diagnosis not present

## 2017-07-16 DIAGNOSIS — M6281 Muscle weakness (generalized): Secondary | ICD-10-CM | POA: Diagnosis not present

## 2017-07-16 DIAGNOSIS — R1312 Dysphagia, oropharyngeal phase: Secondary | ICD-10-CM | POA: Diagnosis not present

## 2017-07-16 DIAGNOSIS — E785 Hyperlipidemia, unspecified: Secondary | ICD-10-CM | POA: Diagnosis not present

## 2017-07-16 NOTE — Telephone Encounter (Signed)
Per [employer] Wellness, attempted to call both of the patient's listed phone numbers to inform her that we are incorrectly listed as her current PCP and need to know who she now sees for Primary Care. We also called to tell the patient that she needs to inform her insurance of that change.   We were unable to reach the patient through either number because they are no longer receiving calls or are disconnected.

## 2017-07-17 DIAGNOSIS — I10 Essential (primary) hypertension: Secondary | ICD-10-CM | POA: Diagnosis not present

## 2017-07-17 DIAGNOSIS — I2510 Atherosclerotic heart disease of native coronary artery without angina pectoris: Secondary | ICD-10-CM | POA: Diagnosis not present

## 2017-07-17 DIAGNOSIS — G309 Alzheimer's disease, unspecified: Secondary | ICD-10-CM | POA: Diagnosis not present

## 2017-07-17 DIAGNOSIS — C642 Malignant neoplasm of left kidney, except renal pelvis: Secondary | ICD-10-CM | POA: Diagnosis not present

## 2017-07-17 DIAGNOSIS — R1312 Dysphagia, oropharyngeal phase: Secondary | ICD-10-CM | POA: Diagnosis not present

## 2017-07-17 DIAGNOSIS — M6281 Muscle weakness (generalized): Secondary | ICD-10-CM | POA: Diagnosis not present

## 2017-07-17 DIAGNOSIS — M8580 Other specified disorders of bone density and structure, unspecified site: Secondary | ICD-10-CM | POA: Diagnosis not present

## 2017-07-17 DIAGNOSIS — E785 Hyperlipidemia, unspecified: Secondary | ICD-10-CM | POA: Diagnosis not present

## 2017-07-18 DIAGNOSIS — M6281 Muscle weakness (generalized): Secondary | ICD-10-CM | POA: Diagnosis not present

## 2017-07-18 DIAGNOSIS — I10 Essential (primary) hypertension: Secondary | ICD-10-CM | POA: Diagnosis not present

## 2017-07-18 DIAGNOSIS — E785 Hyperlipidemia, unspecified: Secondary | ICD-10-CM | POA: Diagnosis not present

## 2017-07-18 DIAGNOSIS — D4100 Neoplasm of uncertain behavior of unspecified kidney: Secondary | ICD-10-CM | POA: Diagnosis not present

## 2017-07-18 DIAGNOSIS — I2510 Atherosclerotic heart disease of native coronary artery without angina pectoris: Secondary | ICD-10-CM | POA: Diagnosis not present

## 2017-07-18 DIAGNOSIS — F0390 Unspecified dementia without behavioral disturbance: Secondary | ICD-10-CM | POA: Diagnosis not present

## 2017-07-18 DIAGNOSIS — C642 Malignant neoplasm of left kidney, except renal pelvis: Secondary | ICD-10-CM | POA: Diagnosis not present

## 2017-07-18 DIAGNOSIS — M8580 Other specified disorders of bone density and structure, unspecified site: Secondary | ICD-10-CM | POA: Diagnosis not present

## 2017-07-18 DIAGNOSIS — R1312 Dysphagia, oropharyngeal phase: Secondary | ICD-10-CM | POA: Diagnosis not present

## 2017-07-18 DIAGNOSIS — G309 Alzheimer's disease, unspecified: Secondary | ICD-10-CM | POA: Diagnosis not present

## 2017-07-18 DIAGNOSIS — M549 Dorsalgia, unspecified: Secondary | ICD-10-CM | POA: Diagnosis not present

## 2017-07-18 DIAGNOSIS — K5900 Constipation, unspecified: Secondary | ICD-10-CM | POA: Diagnosis not present

## 2017-07-18 DIAGNOSIS — D381 Neoplasm of uncertain behavior of trachea, bronchus and lung: Secondary | ICD-10-CM | POA: Diagnosis not present

## 2017-07-19 DIAGNOSIS — R1312 Dysphagia, oropharyngeal phase: Secondary | ICD-10-CM | POA: Diagnosis not present

## 2017-07-19 DIAGNOSIS — C642 Malignant neoplasm of left kidney, except renal pelvis: Secondary | ICD-10-CM | POA: Diagnosis not present

## 2017-07-19 DIAGNOSIS — I2510 Atherosclerotic heart disease of native coronary artery without angina pectoris: Secondary | ICD-10-CM | POA: Diagnosis not present

## 2017-07-19 DIAGNOSIS — M6281 Muscle weakness (generalized): Secondary | ICD-10-CM | POA: Diagnosis not present

## 2017-07-19 DIAGNOSIS — I10 Essential (primary) hypertension: Secondary | ICD-10-CM | POA: Diagnosis not present

## 2017-07-19 DIAGNOSIS — E785 Hyperlipidemia, unspecified: Secondary | ICD-10-CM | POA: Diagnosis not present

## 2017-07-19 DIAGNOSIS — M8580 Other specified disorders of bone density and structure, unspecified site: Secondary | ICD-10-CM | POA: Diagnosis not present

## 2017-07-19 DIAGNOSIS — G309 Alzheimer's disease, unspecified: Secondary | ICD-10-CM | POA: Diagnosis not present

## 2017-09-09 DIAGNOSIS — I739 Peripheral vascular disease, unspecified: Secondary | ICD-10-CM | POA: Diagnosis not present

## 2017-09-09 DIAGNOSIS — B351 Tinea unguium: Secondary | ICD-10-CM | POA: Diagnosis not present

## 2017-09-09 DIAGNOSIS — L84 Corns and callosities: Secondary | ICD-10-CM | POA: Diagnosis not present

## 2017-09-22 DIAGNOSIS — M5489 Other dorsalgia: Secondary | ICD-10-CM | POA: Diagnosis not present

## 2017-09-22 DIAGNOSIS — E785 Hyperlipidemia, unspecified: Secondary | ICD-10-CM | POA: Diagnosis not present

## 2017-09-22 DIAGNOSIS — D4100 Neoplasm of uncertain behavior of unspecified kidney: Secondary | ICD-10-CM | POA: Diagnosis not present

## 2017-09-22 DIAGNOSIS — I10 Essential (primary) hypertension: Secondary | ICD-10-CM | POA: Diagnosis not present

## 2017-09-22 DIAGNOSIS — D381 Neoplasm of uncertain behavior of trachea, bronchus and lung: Secondary | ICD-10-CM | POA: Diagnosis not present

## 2017-09-22 DIAGNOSIS — F0390 Unspecified dementia without behavioral disturbance: Secondary | ICD-10-CM | POA: Diagnosis not present

## 2017-10-16 DIAGNOSIS — I10 Essential (primary) hypertension: Secondary | ICD-10-CM | POA: Diagnosis not present

## 2017-10-16 DIAGNOSIS — N390 Urinary tract infection, site not specified: Secondary | ICD-10-CM | POA: Diagnosis not present

## 2017-10-17 DIAGNOSIS — N390 Urinary tract infection, site not specified: Secondary | ICD-10-CM | POA: Diagnosis not present

## 2017-10-25 DIAGNOSIS — N390 Urinary tract infection, site not specified: Secondary | ICD-10-CM | POA: Diagnosis not present

## 2017-10-27 DIAGNOSIS — R41841 Cognitive communication deficit: Secondary | ICD-10-CM | POA: Diagnosis not present

## 2017-10-27 DIAGNOSIS — R1312 Dysphagia, oropharyngeal phase: Secondary | ICD-10-CM | POA: Diagnosis not present

## 2017-10-27 DIAGNOSIS — M6281 Muscle weakness (generalized): Secondary | ICD-10-CM | POA: Diagnosis not present

## 2017-10-27 DIAGNOSIS — E785 Hyperlipidemia, unspecified: Secondary | ICD-10-CM | POA: Diagnosis not present

## 2017-10-27 DIAGNOSIS — C642 Malignant neoplasm of left kidney, except renal pelvis: Secondary | ICD-10-CM | POA: Diagnosis not present

## 2017-10-27 DIAGNOSIS — G309 Alzheimer's disease, unspecified: Secondary | ICD-10-CM | POA: Diagnosis not present

## 2017-10-27 DIAGNOSIS — I10 Essential (primary) hypertension: Secondary | ICD-10-CM | POA: Diagnosis not present

## 2017-10-27 DIAGNOSIS — I2510 Atherosclerotic heart disease of native coronary artery without angina pectoris: Secondary | ICD-10-CM | POA: Diagnosis not present

## 2017-10-29 DIAGNOSIS — M6281 Muscle weakness (generalized): Secondary | ICD-10-CM | POA: Diagnosis not present

## 2017-10-29 DIAGNOSIS — I2510 Atherosclerotic heart disease of native coronary artery without angina pectoris: Secondary | ICD-10-CM | POA: Diagnosis not present

## 2017-10-29 DIAGNOSIS — E785 Hyperlipidemia, unspecified: Secondary | ICD-10-CM | POA: Diagnosis not present

## 2017-10-29 DIAGNOSIS — G309 Alzheimer's disease, unspecified: Secondary | ICD-10-CM | POA: Diagnosis not present

## 2017-10-29 DIAGNOSIS — R1312 Dysphagia, oropharyngeal phase: Secondary | ICD-10-CM | POA: Diagnosis not present

## 2017-10-29 DIAGNOSIS — I10 Essential (primary) hypertension: Secondary | ICD-10-CM | POA: Diagnosis not present

## 2017-10-29 DIAGNOSIS — C642 Malignant neoplasm of left kidney, except renal pelvis: Secondary | ICD-10-CM | POA: Diagnosis not present

## 2017-10-29 DIAGNOSIS — R41841 Cognitive communication deficit: Secondary | ICD-10-CM | POA: Diagnosis not present

## 2017-11-01 DIAGNOSIS — C642 Malignant neoplasm of left kidney, except renal pelvis: Secondary | ICD-10-CM | POA: Diagnosis not present

## 2017-11-01 DIAGNOSIS — I2510 Atherosclerotic heart disease of native coronary artery without angina pectoris: Secondary | ICD-10-CM | POA: Diagnosis not present

## 2017-11-01 DIAGNOSIS — R41841 Cognitive communication deficit: Secondary | ICD-10-CM | POA: Diagnosis not present

## 2017-11-01 DIAGNOSIS — R1312 Dysphagia, oropharyngeal phase: Secondary | ICD-10-CM | POA: Diagnosis not present

## 2017-11-01 DIAGNOSIS — G309 Alzheimer's disease, unspecified: Secondary | ICD-10-CM | POA: Diagnosis not present

## 2017-11-01 DIAGNOSIS — E785 Hyperlipidemia, unspecified: Secondary | ICD-10-CM | POA: Diagnosis not present

## 2017-11-01 DIAGNOSIS — M6281 Muscle weakness (generalized): Secondary | ICD-10-CM | POA: Diagnosis not present

## 2017-11-01 DIAGNOSIS — I10 Essential (primary) hypertension: Secondary | ICD-10-CM | POA: Diagnosis not present

## 2017-11-02 DIAGNOSIS — R41841 Cognitive communication deficit: Secondary | ICD-10-CM | POA: Diagnosis not present

## 2017-11-02 DIAGNOSIS — I2510 Atherosclerotic heart disease of native coronary artery without angina pectoris: Secondary | ICD-10-CM | POA: Diagnosis not present

## 2017-11-02 DIAGNOSIS — I10 Essential (primary) hypertension: Secondary | ICD-10-CM | POA: Diagnosis not present

## 2017-11-02 DIAGNOSIS — G309 Alzheimer's disease, unspecified: Secondary | ICD-10-CM | POA: Diagnosis not present

## 2017-11-02 DIAGNOSIS — C642 Malignant neoplasm of left kidney, except renal pelvis: Secondary | ICD-10-CM | POA: Diagnosis not present

## 2017-11-02 DIAGNOSIS — M6281 Muscle weakness (generalized): Secondary | ICD-10-CM | POA: Diagnosis not present

## 2017-11-02 DIAGNOSIS — R1312 Dysphagia, oropharyngeal phase: Secondary | ICD-10-CM | POA: Diagnosis not present

## 2017-11-02 DIAGNOSIS — E785 Hyperlipidemia, unspecified: Secondary | ICD-10-CM | POA: Diagnosis not present

## 2017-11-03 DIAGNOSIS — R41841 Cognitive communication deficit: Secondary | ICD-10-CM | POA: Diagnosis not present

## 2017-11-03 DIAGNOSIS — I10 Essential (primary) hypertension: Secondary | ICD-10-CM | POA: Diagnosis not present

## 2017-11-03 DIAGNOSIS — M6281 Muscle weakness (generalized): Secondary | ICD-10-CM | POA: Diagnosis not present

## 2017-11-03 DIAGNOSIS — G309 Alzheimer's disease, unspecified: Secondary | ICD-10-CM | POA: Diagnosis not present

## 2017-11-03 DIAGNOSIS — I2510 Atherosclerotic heart disease of native coronary artery without angina pectoris: Secondary | ICD-10-CM | POA: Diagnosis not present

## 2017-11-03 DIAGNOSIS — C642 Malignant neoplasm of left kidney, except renal pelvis: Secondary | ICD-10-CM | POA: Diagnosis not present

## 2017-11-03 DIAGNOSIS — E785 Hyperlipidemia, unspecified: Secondary | ICD-10-CM | POA: Diagnosis not present

## 2017-11-03 DIAGNOSIS — R1312 Dysphagia, oropharyngeal phase: Secondary | ICD-10-CM | POA: Diagnosis not present

## 2017-11-11 DIAGNOSIS — R41841 Cognitive communication deficit: Secondary | ICD-10-CM | POA: Diagnosis not present

## 2017-11-11 DIAGNOSIS — I2510 Atherosclerotic heart disease of native coronary artery without angina pectoris: Secondary | ICD-10-CM | POA: Diagnosis not present

## 2017-11-11 DIAGNOSIS — C642 Malignant neoplasm of left kidney, except renal pelvis: Secondary | ICD-10-CM | POA: Diagnosis not present

## 2017-11-11 DIAGNOSIS — E785 Hyperlipidemia, unspecified: Secondary | ICD-10-CM | POA: Diagnosis not present

## 2017-11-11 DIAGNOSIS — R1312 Dysphagia, oropharyngeal phase: Secondary | ICD-10-CM | POA: Diagnosis not present

## 2017-11-11 DIAGNOSIS — I10 Essential (primary) hypertension: Secondary | ICD-10-CM | POA: Diagnosis not present

## 2017-11-11 DIAGNOSIS — G309 Alzheimer's disease, unspecified: Secondary | ICD-10-CM | POA: Diagnosis not present

## 2017-11-11 DIAGNOSIS — M6281 Muscle weakness (generalized): Secondary | ICD-10-CM | POA: Diagnosis not present

## 2017-11-21 DIAGNOSIS — M5489 Other dorsalgia: Secondary | ICD-10-CM | POA: Diagnosis not present

## 2017-11-21 DIAGNOSIS — F0390 Unspecified dementia without behavioral disturbance: Secondary | ICD-10-CM | POA: Diagnosis not present

## 2017-11-21 DIAGNOSIS — D4100 Neoplasm of uncertain behavior of unspecified kidney: Secondary | ICD-10-CM | POA: Diagnosis not present

## 2017-11-21 DIAGNOSIS — E785 Hyperlipidemia, unspecified: Secondary | ICD-10-CM | POA: Diagnosis not present

## 2017-11-21 DIAGNOSIS — I10 Essential (primary) hypertension: Secondary | ICD-10-CM | POA: Diagnosis not present

## 2017-12-10 DIAGNOSIS — I10 Essential (primary) hypertension: Secondary | ICD-10-CM | POA: Diagnosis not present

## 2017-12-10 DIAGNOSIS — R1312 Dysphagia, oropharyngeal phase: Secondary | ICD-10-CM | POA: Diagnosis not present

## 2017-12-10 DIAGNOSIS — M6281 Muscle weakness (generalized): Secondary | ICD-10-CM | POA: Diagnosis not present

## 2017-12-10 DIAGNOSIS — G309 Alzheimer's disease, unspecified: Secondary | ICD-10-CM | POA: Diagnosis not present

## 2017-12-10 DIAGNOSIS — R41841 Cognitive communication deficit: Secondary | ICD-10-CM | POA: Diagnosis not present

## 2017-12-10 DIAGNOSIS — I2510 Atherosclerotic heart disease of native coronary artery without angina pectoris: Secondary | ICD-10-CM | POA: Diagnosis not present

## 2017-12-10 DIAGNOSIS — M8580 Other specified disorders of bone density and structure, unspecified site: Secondary | ICD-10-CM | POA: Diagnosis not present

## 2017-12-10 DIAGNOSIS — E785 Hyperlipidemia, unspecified: Secondary | ICD-10-CM | POA: Diagnosis not present

## 2017-12-10 DIAGNOSIS — C642 Malignant neoplasm of left kidney, except renal pelvis: Secondary | ICD-10-CM | POA: Diagnosis not present

## 2017-12-11 DIAGNOSIS — C642 Malignant neoplasm of left kidney, except renal pelvis: Secondary | ICD-10-CM | POA: Diagnosis not present

## 2017-12-11 DIAGNOSIS — R41841 Cognitive communication deficit: Secondary | ICD-10-CM | POA: Diagnosis not present

## 2017-12-11 DIAGNOSIS — M8580 Other specified disorders of bone density and structure, unspecified site: Secondary | ICD-10-CM | POA: Diagnosis not present

## 2017-12-11 DIAGNOSIS — E785 Hyperlipidemia, unspecified: Secondary | ICD-10-CM | POA: Diagnosis not present

## 2017-12-11 DIAGNOSIS — I10 Essential (primary) hypertension: Secondary | ICD-10-CM | POA: Diagnosis not present

## 2017-12-11 DIAGNOSIS — M6281 Muscle weakness (generalized): Secondary | ICD-10-CM | POA: Diagnosis not present

## 2017-12-11 DIAGNOSIS — G309 Alzheimer's disease, unspecified: Secondary | ICD-10-CM | POA: Diagnosis not present

## 2017-12-11 DIAGNOSIS — I2510 Atherosclerotic heart disease of native coronary artery without angina pectoris: Secondary | ICD-10-CM | POA: Diagnosis not present

## 2017-12-11 DIAGNOSIS — R1312 Dysphagia, oropharyngeal phase: Secondary | ICD-10-CM | POA: Diagnosis not present

## 2017-12-12 DIAGNOSIS — I2510 Atherosclerotic heart disease of native coronary artery without angina pectoris: Secondary | ICD-10-CM | POA: Diagnosis not present

## 2017-12-12 DIAGNOSIS — G309 Alzheimer's disease, unspecified: Secondary | ICD-10-CM | POA: Diagnosis not present

## 2017-12-12 DIAGNOSIS — R41841 Cognitive communication deficit: Secondary | ICD-10-CM | POA: Diagnosis not present

## 2017-12-12 DIAGNOSIS — I10 Essential (primary) hypertension: Secondary | ICD-10-CM | POA: Diagnosis not present

## 2017-12-12 DIAGNOSIS — C642 Malignant neoplasm of left kidney, except renal pelvis: Secondary | ICD-10-CM | POA: Diagnosis not present

## 2017-12-12 DIAGNOSIS — E785 Hyperlipidemia, unspecified: Secondary | ICD-10-CM | POA: Diagnosis not present

## 2017-12-12 DIAGNOSIS — M6281 Muscle weakness (generalized): Secondary | ICD-10-CM | POA: Diagnosis not present

## 2017-12-12 DIAGNOSIS — R1312 Dysphagia, oropharyngeal phase: Secondary | ICD-10-CM | POA: Diagnosis not present

## 2017-12-12 DIAGNOSIS — M8580 Other specified disorders of bone density and structure, unspecified site: Secondary | ICD-10-CM | POA: Diagnosis not present

## 2017-12-13 DIAGNOSIS — R41841 Cognitive communication deficit: Secondary | ICD-10-CM | POA: Diagnosis not present

## 2017-12-13 DIAGNOSIS — I2510 Atherosclerotic heart disease of native coronary artery without angina pectoris: Secondary | ICD-10-CM | POA: Diagnosis not present

## 2017-12-13 DIAGNOSIS — G309 Alzheimer's disease, unspecified: Secondary | ICD-10-CM | POA: Diagnosis not present

## 2017-12-13 DIAGNOSIS — L603 Nail dystrophy: Secondary | ICD-10-CM | POA: Diagnosis not present

## 2017-12-13 DIAGNOSIS — I10 Essential (primary) hypertension: Secondary | ICD-10-CM | POA: Diagnosis not present

## 2017-12-13 DIAGNOSIS — L600 Ingrowing nail: Secondary | ICD-10-CM | POA: Diagnosis not present

## 2017-12-13 DIAGNOSIS — M6281 Muscle weakness (generalized): Secondary | ICD-10-CM | POA: Diagnosis not present

## 2017-12-13 DIAGNOSIS — M8580 Other specified disorders of bone density and structure, unspecified site: Secondary | ICD-10-CM | POA: Diagnosis not present

## 2017-12-13 DIAGNOSIS — I739 Peripheral vascular disease, unspecified: Secondary | ICD-10-CM | POA: Diagnosis not present

## 2017-12-13 DIAGNOSIS — C642 Malignant neoplasm of left kidney, except renal pelvis: Secondary | ICD-10-CM | POA: Diagnosis not present

## 2017-12-13 DIAGNOSIS — B351 Tinea unguium: Secondary | ICD-10-CM | POA: Diagnosis not present

## 2017-12-13 DIAGNOSIS — E785 Hyperlipidemia, unspecified: Secondary | ICD-10-CM | POA: Diagnosis not present

## 2017-12-13 DIAGNOSIS — M79674 Pain in right toe(s): Secondary | ICD-10-CM | POA: Diagnosis not present

## 2017-12-13 DIAGNOSIS — R1312 Dysphagia, oropharyngeal phase: Secondary | ICD-10-CM | POA: Diagnosis not present

## 2017-12-16 DIAGNOSIS — R41841 Cognitive communication deficit: Secondary | ICD-10-CM | POA: Diagnosis not present

## 2017-12-16 DIAGNOSIS — C642 Malignant neoplasm of left kidney, except renal pelvis: Secondary | ICD-10-CM | POA: Diagnosis not present

## 2017-12-16 DIAGNOSIS — I2510 Atherosclerotic heart disease of native coronary artery without angina pectoris: Secondary | ICD-10-CM | POA: Diagnosis not present

## 2017-12-16 DIAGNOSIS — I10 Essential (primary) hypertension: Secondary | ICD-10-CM | POA: Diagnosis not present

## 2017-12-16 DIAGNOSIS — M8580 Other specified disorders of bone density and structure, unspecified site: Secondary | ICD-10-CM | POA: Diagnosis not present

## 2017-12-16 DIAGNOSIS — G309 Alzheimer's disease, unspecified: Secondary | ICD-10-CM | POA: Diagnosis not present

## 2017-12-16 DIAGNOSIS — R1312 Dysphagia, oropharyngeal phase: Secondary | ICD-10-CM | POA: Diagnosis not present

## 2017-12-16 DIAGNOSIS — E785 Hyperlipidemia, unspecified: Secondary | ICD-10-CM | POA: Diagnosis not present

## 2017-12-16 DIAGNOSIS — M6281 Muscle weakness (generalized): Secondary | ICD-10-CM | POA: Diagnosis not present

## 2017-12-17 DIAGNOSIS — I2510 Atherosclerotic heart disease of native coronary artery without angina pectoris: Secondary | ICD-10-CM | POA: Diagnosis not present

## 2017-12-17 DIAGNOSIS — C642 Malignant neoplasm of left kidney, except renal pelvis: Secondary | ICD-10-CM | POA: Diagnosis not present

## 2017-12-17 DIAGNOSIS — R1312 Dysphagia, oropharyngeal phase: Secondary | ICD-10-CM | POA: Diagnosis not present

## 2017-12-17 DIAGNOSIS — R41841 Cognitive communication deficit: Secondary | ICD-10-CM | POA: Diagnosis not present

## 2017-12-17 DIAGNOSIS — M8580 Other specified disorders of bone density and structure, unspecified site: Secondary | ICD-10-CM | POA: Diagnosis not present

## 2017-12-17 DIAGNOSIS — E785 Hyperlipidemia, unspecified: Secondary | ICD-10-CM | POA: Diagnosis not present

## 2017-12-17 DIAGNOSIS — I10 Essential (primary) hypertension: Secondary | ICD-10-CM | POA: Diagnosis not present

## 2017-12-17 DIAGNOSIS — M6281 Muscle weakness (generalized): Secondary | ICD-10-CM | POA: Diagnosis not present

## 2017-12-17 DIAGNOSIS — G309 Alzheimer's disease, unspecified: Secondary | ICD-10-CM | POA: Diagnosis not present

## 2017-12-18 DIAGNOSIS — G309 Alzheimer's disease, unspecified: Secondary | ICD-10-CM | POA: Diagnosis not present

## 2017-12-18 DIAGNOSIS — I10 Essential (primary) hypertension: Secondary | ICD-10-CM | POA: Diagnosis not present

## 2017-12-18 DIAGNOSIS — M8580 Other specified disorders of bone density and structure, unspecified site: Secondary | ICD-10-CM | POA: Diagnosis not present

## 2017-12-18 DIAGNOSIS — R41841 Cognitive communication deficit: Secondary | ICD-10-CM | POA: Diagnosis not present

## 2017-12-18 DIAGNOSIS — M6281 Muscle weakness (generalized): Secondary | ICD-10-CM | POA: Diagnosis not present

## 2017-12-18 DIAGNOSIS — I2510 Atherosclerotic heart disease of native coronary artery without angina pectoris: Secondary | ICD-10-CM | POA: Diagnosis not present

## 2017-12-18 DIAGNOSIS — E785 Hyperlipidemia, unspecified: Secondary | ICD-10-CM | POA: Diagnosis not present

## 2017-12-18 DIAGNOSIS — C642 Malignant neoplasm of left kidney, except renal pelvis: Secondary | ICD-10-CM | POA: Diagnosis not present

## 2017-12-18 DIAGNOSIS — R1312 Dysphagia, oropharyngeal phase: Secondary | ICD-10-CM | POA: Diagnosis not present

## 2017-12-19 DIAGNOSIS — C642 Malignant neoplasm of left kidney, except renal pelvis: Secondary | ICD-10-CM | POA: Diagnosis not present

## 2017-12-19 DIAGNOSIS — R41841 Cognitive communication deficit: Secondary | ICD-10-CM | POA: Diagnosis not present

## 2017-12-19 DIAGNOSIS — M8580 Other specified disorders of bone density and structure, unspecified site: Secondary | ICD-10-CM | POA: Diagnosis not present

## 2017-12-19 DIAGNOSIS — E785 Hyperlipidemia, unspecified: Secondary | ICD-10-CM | POA: Diagnosis not present

## 2017-12-19 DIAGNOSIS — R1312 Dysphagia, oropharyngeal phase: Secondary | ICD-10-CM | POA: Diagnosis not present

## 2017-12-19 DIAGNOSIS — M6281 Muscle weakness (generalized): Secondary | ICD-10-CM | POA: Diagnosis not present

## 2017-12-19 DIAGNOSIS — G309 Alzheimer's disease, unspecified: Secondary | ICD-10-CM | POA: Diagnosis not present

## 2017-12-19 DIAGNOSIS — I10 Essential (primary) hypertension: Secondary | ICD-10-CM | POA: Diagnosis not present

## 2017-12-19 DIAGNOSIS — I2510 Atherosclerotic heart disease of native coronary artery without angina pectoris: Secondary | ICD-10-CM | POA: Diagnosis not present

## 2017-12-20 DIAGNOSIS — R1312 Dysphagia, oropharyngeal phase: Secondary | ICD-10-CM | POA: Diagnosis not present

## 2017-12-20 DIAGNOSIS — I10 Essential (primary) hypertension: Secondary | ICD-10-CM | POA: Diagnosis not present

## 2017-12-20 DIAGNOSIS — M8580 Other specified disorders of bone density and structure, unspecified site: Secondary | ICD-10-CM | POA: Diagnosis not present

## 2017-12-20 DIAGNOSIS — I2510 Atherosclerotic heart disease of native coronary artery without angina pectoris: Secondary | ICD-10-CM | POA: Diagnosis not present

## 2017-12-20 DIAGNOSIS — R41841 Cognitive communication deficit: Secondary | ICD-10-CM | POA: Diagnosis not present

## 2017-12-20 DIAGNOSIS — C642 Malignant neoplasm of left kidney, except renal pelvis: Secondary | ICD-10-CM | POA: Diagnosis not present

## 2017-12-20 DIAGNOSIS — M6281 Muscle weakness (generalized): Secondary | ICD-10-CM | POA: Diagnosis not present

## 2017-12-20 DIAGNOSIS — E785 Hyperlipidemia, unspecified: Secondary | ICD-10-CM | POA: Diagnosis not present

## 2017-12-20 DIAGNOSIS — G309 Alzheimer's disease, unspecified: Secondary | ICD-10-CM | POA: Diagnosis not present

## 2017-12-24 DIAGNOSIS — I10 Essential (primary) hypertension: Secondary | ICD-10-CM | POA: Diagnosis not present

## 2017-12-24 DIAGNOSIS — M8580 Other specified disorders of bone density and structure, unspecified site: Secondary | ICD-10-CM | POA: Diagnosis not present

## 2017-12-24 DIAGNOSIS — I2510 Atherosclerotic heart disease of native coronary artery without angina pectoris: Secondary | ICD-10-CM | POA: Diagnosis not present

## 2017-12-24 DIAGNOSIS — G309 Alzheimer's disease, unspecified: Secondary | ICD-10-CM | POA: Diagnosis not present

## 2017-12-24 DIAGNOSIS — R41841 Cognitive communication deficit: Secondary | ICD-10-CM | POA: Diagnosis not present

## 2017-12-24 DIAGNOSIS — C642 Malignant neoplasm of left kidney, except renal pelvis: Secondary | ICD-10-CM | POA: Diagnosis not present

## 2017-12-24 DIAGNOSIS — E785 Hyperlipidemia, unspecified: Secondary | ICD-10-CM | POA: Diagnosis not present

## 2017-12-24 DIAGNOSIS — R1312 Dysphagia, oropharyngeal phase: Secondary | ICD-10-CM | POA: Diagnosis not present

## 2017-12-24 DIAGNOSIS — M6281 Muscle weakness (generalized): Secondary | ICD-10-CM | POA: Diagnosis not present

## 2017-12-25 DIAGNOSIS — M8580 Other specified disorders of bone density and structure, unspecified site: Secondary | ICD-10-CM | POA: Diagnosis not present

## 2017-12-25 DIAGNOSIS — E785 Hyperlipidemia, unspecified: Secondary | ICD-10-CM | POA: Diagnosis not present

## 2017-12-25 DIAGNOSIS — G309 Alzheimer's disease, unspecified: Secondary | ICD-10-CM | POA: Diagnosis not present

## 2017-12-25 DIAGNOSIS — R1312 Dysphagia, oropharyngeal phase: Secondary | ICD-10-CM | POA: Diagnosis not present

## 2017-12-25 DIAGNOSIS — I10 Essential (primary) hypertension: Secondary | ICD-10-CM | POA: Diagnosis not present

## 2017-12-25 DIAGNOSIS — R41841 Cognitive communication deficit: Secondary | ICD-10-CM | POA: Diagnosis not present

## 2017-12-25 DIAGNOSIS — C642 Malignant neoplasm of left kidney, except renal pelvis: Secondary | ICD-10-CM | POA: Diagnosis not present

## 2017-12-25 DIAGNOSIS — M6281 Muscle weakness (generalized): Secondary | ICD-10-CM | POA: Diagnosis not present

## 2017-12-25 DIAGNOSIS — I2510 Atherosclerotic heart disease of native coronary artery without angina pectoris: Secondary | ICD-10-CM | POA: Diagnosis not present

## 2017-12-26 DIAGNOSIS — C642 Malignant neoplasm of left kidney, except renal pelvis: Secondary | ICD-10-CM | POA: Diagnosis not present

## 2017-12-26 DIAGNOSIS — R41841 Cognitive communication deficit: Secondary | ICD-10-CM | POA: Diagnosis not present

## 2017-12-26 DIAGNOSIS — M8580 Other specified disorders of bone density and structure, unspecified site: Secondary | ICD-10-CM | POA: Diagnosis not present

## 2017-12-26 DIAGNOSIS — I10 Essential (primary) hypertension: Secondary | ICD-10-CM | POA: Diagnosis not present

## 2017-12-26 DIAGNOSIS — G309 Alzheimer's disease, unspecified: Secondary | ICD-10-CM | POA: Diagnosis not present

## 2017-12-26 DIAGNOSIS — I2510 Atherosclerotic heart disease of native coronary artery without angina pectoris: Secondary | ICD-10-CM | POA: Diagnosis not present

## 2017-12-26 DIAGNOSIS — M6281 Muscle weakness (generalized): Secondary | ICD-10-CM | POA: Diagnosis not present

## 2017-12-26 DIAGNOSIS — E785 Hyperlipidemia, unspecified: Secondary | ICD-10-CM | POA: Diagnosis not present

## 2017-12-26 DIAGNOSIS — R1312 Dysphagia, oropharyngeal phase: Secondary | ICD-10-CM | POA: Diagnosis not present

## 2017-12-27 DIAGNOSIS — C642 Malignant neoplasm of left kidney, except renal pelvis: Secondary | ICD-10-CM | POA: Diagnosis not present

## 2017-12-27 DIAGNOSIS — I2510 Atherosclerotic heart disease of native coronary artery without angina pectoris: Secondary | ICD-10-CM | POA: Diagnosis not present

## 2017-12-27 DIAGNOSIS — R41841 Cognitive communication deficit: Secondary | ICD-10-CM | POA: Diagnosis not present

## 2017-12-27 DIAGNOSIS — I10 Essential (primary) hypertension: Secondary | ICD-10-CM | POA: Diagnosis not present

## 2017-12-27 DIAGNOSIS — E785 Hyperlipidemia, unspecified: Secondary | ICD-10-CM | POA: Diagnosis not present

## 2017-12-27 DIAGNOSIS — M6281 Muscle weakness (generalized): Secondary | ICD-10-CM | POA: Diagnosis not present

## 2017-12-27 DIAGNOSIS — R1312 Dysphagia, oropharyngeal phase: Secondary | ICD-10-CM | POA: Diagnosis not present

## 2017-12-27 DIAGNOSIS — M8580 Other specified disorders of bone density and structure, unspecified site: Secondary | ICD-10-CM | POA: Diagnosis not present

## 2017-12-27 DIAGNOSIS — G309 Alzheimer's disease, unspecified: Secondary | ICD-10-CM | POA: Diagnosis not present

## 2017-12-28 DIAGNOSIS — I10 Essential (primary) hypertension: Secondary | ICD-10-CM | POA: Diagnosis not present

## 2017-12-28 DIAGNOSIS — R41841 Cognitive communication deficit: Secondary | ICD-10-CM | POA: Diagnosis not present

## 2017-12-28 DIAGNOSIS — R1312 Dysphagia, oropharyngeal phase: Secondary | ICD-10-CM | POA: Diagnosis not present

## 2017-12-28 DIAGNOSIS — E785 Hyperlipidemia, unspecified: Secondary | ICD-10-CM | POA: Diagnosis not present

## 2017-12-28 DIAGNOSIS — M6281 Muscle weakness (generalized): Secondary | ICD-10-CM | POA: Diagnosis not present

## 2017-12-28 DIAGNOSIS — G309 Alzheimer's disease, unspecified: Secondary | ICD-10-CM | POA: Diagnosis not present

## 2017-12-28 DIAGNOSIS — C642 Malignant neoplasm of left kidney, except renal pelvis: Secondary | ICD-10-CM | POA: Diagnosis not present

## 2017-12-28 DIAGNOSIS — M8580 Other specified disorders of bone density and structure, unspecified site: Secondary | ICD-10-CM | POA: Diagnosis not present

## 2017-12-28 DIAGNOSIS — I2510 Atherosclerotic heart disease of native coronary artery without angina pectoris: Secondary | ICD-10-CM | POA: Diagnosis not present

## 2017-12-30 DIAGNOSIS — C642 Malignant neoplasm of left kidney, except renal pelvis: Secondary | ICD-10-CM | POA: Diagnosis not present

## 2017-12-30 DIAGNOSIS — R1312 Dysphagia, oropharyngeal phase: Secondary | ICD-10-CM | POA: Diagnosis not present

## 2017-12-30 DIAGNOSIS — R41841 Cognitive communication deficit: Secondary | ICD-10-CM | POA: Diagnosis not present

## 2017-12-30 DIAGNOSIS — M6281 Muscle weakness (generalized): Secondary | ICD-10-CM | POA: Diagnosis not present

## 2017-12-30 DIAGNOSIS — E785 Hyperlipidemia, unspecified: Secondary | ICD-10-CM | POA: Diagnosis not present

## 2017-12-30 DIAGNOSIS — M8580 Other specified disorders of bone density and structure, unspecified site: Secondary | ICD-10-CM | POA: Diagnosis not present

## 2017-12-30 DIAGNOSIS — I2510 Atherosclerotic heart disease of native coronary artery without angina pectoris: Secondary | ICD-10-CM | POA: Diagnosis not present

## 2017-12-30 DIAGNOSIS — G309 Alzheimer's disease, unspecified: Secondary | ICD-10-CM | POA: Diagnosis not present

## 2017-12-30 DIAGNOSIS — I10 Essential (primary) hypertension: Secondary | ICD-10-CM | POA: Diagnosis not present

## 2017-12-31 DIAGNOSIS — I10 Essential (primary) hypertension: Secondary | ICD-10-CM | POA: Diagnosis not present

## 2017-12-31 DIAGNOSIS — R41841 Cognitive communication deficit: Secondary | ICD-10-CM | POA: Diagnosis not present

## 2017-12-31 DIAGNOSIS — M6281 Muscle weakness (generalized): Secondary | ICD-10-CM | POA: Diagnosis not present

## 2017-12-31 DIAGNOSIS — C642 Malignant neoplasm of left kidney, except renal pelvis: Secondary | ICD-10-CM | POA: Diagnosis not present

## 2017-12-31 DIAGNOSIS — E785 Hyperlipidemia, unspecified: Secondary | ICD-10-CM | POA: Diagnosis not present

## 2017-12-31 DIAGNOSIS — R1312 Dysphagia, oropharyngeal phase: Secondary | ICD-10-CM | POA: Diagnosis not present

## 2017-12-31 DIAGNOSIS — G309 Alzheimer's disease, unspecified: Secondary | ICD-10-CM | POA: Diagnosis not present

## 2017-12-31 DIAGNOSIS — I2510 Atherosclerotic heart disease of native coronary artery without angina pectoris: Secondary | ICD-10-CM | POA: Diagnosis not present

## 2017-12-31 DIAGNOSIS — M8580 Other specified disorders of bone density and structure, unspecified site: Secondary | ICD-10-CM | POA: Diagnosis not present

## 2018-01-01 DIAGNOSIS — E785 Hyperlipidemia, unspecified: Secondary | ICD-10-CM | POA: Diagnosis not present

## 2018-01-01 DIAGNOSIS — M6281 Muscle weakness (generalized): Secondary | ICD-10-CM | POA: Diagnosis not present

## 2018-01-01 DIAGNOSIS — I2510 Atherosclerotic heart disease of native coronary artery without angina pectoris: Secondary | ICD-10-CM | POA: Diagnosis not present

## 2018-01-01 DIAGNOSIS — R41841 Cognitive communication deficit: Secondary | ICD-10-CM | POA: Diagnosis not present

## 2018-01-01 DIAGNOSIS — R1312 Dysphagia, oropharyngeal phase: Secondary | ICD-10-CM | POA: Diagnosis not present

## 2018-01-01 DIAGNOSIS — C642 Malignant neoplasm of left kidney, except renal pelvis: Secondary | ICD-10-CM | POA: Diagnosis not present

## 2018-01-01 DIAGNOSIS — I10 Essential (primary) hypertension: Secondary | ICD-10-CM | POA: Diagnosis not present

## 2018-01-01 DIAGNOSIS — M8580 Other specified disorders of bone density and structure, unspecified site: Secondary | ICD-10-CM | POA: Diagnosis not present

## 2018-01-01 DIAGNOSIS — G309 Alzheimer's disease, unspecified: Secondary | ICD-10-CM | POA: Diagnosis not present

## 2018-01-03 DIAGNOSIS — Z961 Presence of intraocular lens: Secondary | ICD-10-CM | POA: Diagnosis not present

## 2018-01-03 DIAGNOSIS — H35033 Hypertensive retinopathy, bilateral: Secondary | ICD-10-CM | POA: Diagnosis not present

## 2018-01-21 DIAGNOSIS — I10 Essential (primary) hypertension: Secondary | ICD-10-CM | POA: Diagnosis not present

## 2018-01-21 DIAGNOSIS — D381 Neoplasm of uncertain behavior of trachea, bronchus and lung: Secondary | ICD-10-CM | POA: Diagnosis not present

## 2018-01-21 DIAGNOSIS — E785 Hyperlipidemia, unspecified: Secondary | ICD-10-CM | POA: Diagnosis not present

## 2018-01-21 DIAGNOSIS — M5489 Other dorsalgia: Secondary | ICD-10-CM | POA: Diagnosis not present

## 2018-01-21 DIAGNOSIS — D4100 Neoplasm of uncertain behavior of unspecified kidney: Secondary | ICD-10-CM | POA: Diagnosis not present

## 2018-01-21 DIAGNOSIS — F0390 Unspecified dementia without behavioral disturbance: Secondary | ICD-10-CM | POA: Diagnosis not present

## 2018-01-23 DIAGNOSIS — M6281 Muscle weakness (generalized): Secondary | ICD-10-CM | POA: Diagnosis not present

## 2018-01-23 DIAGNOSIS — I2510 Atherosclerotic heart disease of native coronary artery without angina pectoris: Secondary | ICD-10-CM | POA: Diagnosis not present

## 2018-01-23 DIAGNOSIS — R1312 Dysphagia, oropharyngeal phase: Secondary | ICD-10-CM | POA: Diagnosis not present

## 2018-01-23 DIAGNOSIS — N390 Urinary tract infection, site not specified: Secondary | ICD-10-CM | POA: Diagnosis not present

## 2018-01-23 DIAGNOSIS — I10 Essential (primary) hypertension: Secondary | ICD-10-CM | POA: Diagnosis not present

## 2018-01-23 DIAGNOSIS — E785 Hyperlipidemia, unspecified: Secondary | ICD-10-CM | POA: Diagnosis not present

## 2018-01-23 DIAGNOSIS — C642 Malignant neoplasm of left kidney, except renal pelvis: Secondary | ICD-10-CM | POA: Diagnosis not present

## 2018-01-24 DIAGNOSIS — E785 Hyperlipidemia, unspecified: Secondary | ICD-10-CM | POA: Diagnosis not present

## 2018-01-24 DIAGNOSIS — M6281 Muscle weakness (generalized): Secondary | ICD-10-CM | POA: Diagnosis not present

## 2018-01-24 DIAGNOSIS — C642 Malignant neoplasm of left kidney, except renal pelvis: Secondary | ICD-10-CM | POA: Diagnosis not present

## 2018-01-24 DIAGNOSIS — N390 Urinary tract infection, site not specified: Secondary | ICD-10-CM | POA: Diagnosis not present

## 2018-01-24 DIAGNOSIS — I10 Essential (primary) hypertension: Secondary | ICD-10-CM | POA: Diagnosis not present

## 2018-01-24 DIAGNOSIS — I2510 Atherosclerotic heart disease of native coronary artery without angina pectoris: Secondary | ICD-10-CM | POA: Diagnosis not present

## 2018-01-24 DIAGNOSIS — R1312 Dysphagia, oropharyngeal phase: Secondary | ICD-10-CM | POA: Diagnosis not present

## 2018-01-27 DIAGNOSIS — N390 Urinary tract infection, site not specified: Secondary | ICD-10-CM | POA: Diagnosis not present

## 2018-01-27 DIAGNOSIS — M6281 Muscle weakness (generalized): Secondary | ICD-10-CM | POA: Diagnosis not present

## 2018-01-27 DIAGNOSIS — I10 Essential (primary) hypertension: Secondary | ICD-10-CM | POA: Diagnosis not present

## 2018-01-27 DIAGNOSIS — C642 Malignant neoplasm of left kidney, except renal pelvis: Secondary | ICD-10-CM | POA: Diagnosis not present

## 2018-01-27 DIAGNOSIS — D649 Anemia, unspecified: Secondary | ICD-10-CM | POA: Diagnosis not present

## 2018-01-27 DIAGNOSIS — I2510 Atherosclerotic heart disease of native coronary artery without angina pectoris: Secondary | ICD-10-CM | POA: Diagnosis not present

## 2018-01-27 DIAGNOSIS — R1312 Dysphagia, oropharyngeal phase: Secondary | ICD-10-CM | POA: Diagnosis not present

## 2018-01-27 DIAGNOSIS — E785 Hyperlipidemia, unspecified: Secondary | ICD-10-CM | POA: Diagnosis not present

## 2018-01-28 DIAGNOSIS — R1312 Dysphagia, oropharyngeal phase: Secondary | ICD-10-CM | POA: Diagnosis not present

## 2018-01-28 DIAGNOSIS — I2510 Atherosclerotic heart disease of native coronary artery without angina pectoris: Secondary | ICD-10-CM | POA: Diagnosis not present

## 2018-01-28 DIAGNOSIS — C642 Malignant neoplasm of left kidney, except renal pelvis: Secondary | ICD-10-CM | POA: Diagnosis not present

## 2018-01-28 DIAGNOSIS — I10 Essential (primary) hypertension: Secondary | ICD-10-CM | POA: Diagnosis not present

## 2018-01-28 DIAGNOSIS — N390 Urinary tract infection, site not specified: Secondary | ICD-10-CM | POA: Diagnosis not present

## 2018-01-28 DIAGNOSIS — E785 Hyperlipidemia, unspecified: Secondary | ICD-10-CM | POA: Diagnosis not present

## 2018-01-28 DIAGNOSIS — M6281 Muscle weakness (generalized): Secondary | ICD-10-CM | POA: Diagnosis not present

## 2018-01-29 DIAGNOSIS — R1312 Dysphagia, oropharyngeal phase: Secondary | ICD-10-CM | POA: Diagnosis not present

## 2018-01-29 DIAGNOSIS — C642 Malignant neoplasm of left kidney, except renal pelvis: Secondary | ICD-10-CM | POA: Diagnosis not present

## 2018-01-29 DIAGNOSIS — E785 Hyperlipidemia, unspecified: Secondary | ICD-10-CM | POA: Diagnosis not present

## 2018-01-29 DIAGNOSIS — N390 Urinary tract infection, site not specified: Secondary | ICD-10-CM | POA: Diagnosis not present

## 2018-01-29 DIAGNOSIS — I2510 Atherosclerotic heart disease of native coronary artery without angina pectoris: Secondary | ICD-10-CM | POA: Diagnosis not present

## 2018-01-29 DIAGNOSIS — I10 Essential (primary) hypertension: Secondary | ICD-10-CM | POA: Diagnosis not present

## 2018-01-29 DIAGNOSIS — M6281 Muscle weakness (generalized): Secondary | ICD-10-CM | POA: Diagnosis not present

## 2018-01-30 DIAGNOSIS — E785 Hyperlipidemia, unspecified: Secondary | ICD-10-CM | POA: Diagnosis not present

## 2018-01-30 DIAGNOSIS — I2510 Atherosclerotic heart disease of native coronary artery without angina pectoris: Secondary | ICD-10-CM | POA: Diagnosis not present

## 2018-01-30 DIAGNOSIS — N390 Urinary tract infection, site not specified: Secondary | ICD-10-CM | POA: Diagnosis not present

## 2018-01-30 DIAGNOSIS — I10 Essential (primary) hypertension: Secondary | ICD-10-CM | POA: Diagnosis not present

## 2018-01-30 DIAGNOSIS — M6281 Muscle weakness (generalized): Secondary | ICD-10-CM | POA: Diagnosis not present

## 2018-01-30 DIAGNOSIS — R1312 Dysphagia, oropharyngeal phase: Secondary | ICD-10-CM | POA: Diagnosis not present

## 2018-01-30 DIAGNOSIS — C642 Malignant neoplasm of left kidney, except renal pelvis: Secondary | ICD-10-CM | POA: Diagnosis not present

## 2018-02-02 DIAGNOSIS — C642 Malignant neoplasm of left kidney, except renal pelvis: Secondary | ICD-10-CM | POA: Diagnosis not present

## 2018-02-02 DIAGNOSIS — R1312 Dysphagia, oropharyngeal phase: Secondary | ICD-10-CM | POA: Diagnosis not present

## 2018-02-02 DIAGNOSIS — E785 Hyperlipidemia, unspecified: Secondary | ICD-10-CM | POA: Diagnosis not present

## 2018-02-02 DIAGNOSIS — M6281 Muscle weakness (generalized): Secondary | ICD-10-CM | POA: Diagnosis not present

## 2018-02-02 DIAGNOSIS — I10 Essential (primary) hypertension: Secondary | ICD-10-CM | POA: Diagnosis not present

## 2018-02-02 DIAGNOSIS — N390 Urinary tract infection, site not specified: Secondary | ICD-10-CM | POA: Diagnosis not present

## 2018-02-02 DIAGNOSIS — I2510 Atherosclerotic heart disease of native coronary artery without angina pectoris: Secondary | ICD-10-CM | POA: Diagnosis not present

## 2018-02-03 DIAGNOSIS — N390 Urinary tract infection, site not specified: Secondary | ICD-10-CM | POA: Diagnosis not present

## 2018-02-03 DIAGNOSIS — R1312 Dysphagia, oropharyngeal phase: Secondary | ICD-10-CM | POA: Diagnosis not present

## 2018-02-03 DIAGNOSIS — I2510 Atherosclerotic heart disease of native coronary artery without angina pectoris: Secondary | ICD-10-CM | POA: Diagnosis not present

## 2018-02-03 DIAGNOSIS — C642 Malignant neoplasm of left kidney, except renal pelvis: Secondary | ICD-10-CM | POA: Diagnosis not present

## 2018-02-03 DIAGNOSIS — E785 Hyperlipidemia, unspecified: Secondary | ICD-10-CM | POA: Diagnosis not present

## 2018-02-03 DIAGNOSIS — M6281 Muscle weakness (generalized): Secondary | ICD-10-CM | POA: Diagnosis not present

## 2018-02-03 DIAGNOSIS — I10 Essential (primary) hypertension: Secondary | ICD-10-CM | POA: Diagnosis not present

## 2018-02-04 DIAGNOSIS — C642 Malignant neoplasm of left kidney, except renal pelvis: Secondary | ICD-10-CM | POA: Diagnosis not present

## 2018-02-04 DIAGNOSIS — M6281 Muscle weakness (generalized): Secondary | ICD-10-CM | POA: Diagnosis not present

## 2018-02-04 DIAGNOSIS — I2510 Atherosclerotic heart disease of native coronary artery without angina pectoris: Secondary | ICD-10-CM | POA: Diagnosis not present

## 2018-02-04 DIAGNOSIS — N390 Urinary tract infection, site not specified: Secondary | ICD-10-CM | POA: Diagnosis not present

## 2018-02-04 DIAGNOSIS — I10 Essential (primary) hypertension: Secondary | ICD-10-CM | POA: Diagnosis not present

## 2018-02-04 DIAGNOSIS — R1312 Dysphagia, oropharyngeal phase: Secondary | ICD-10-CM | POA: Diagnosis not present

## 2018-02-04 DIAGNOSIS — E785 Hyperlipidemia, unspecified: Secondary | ICD-10-CM | POA: Diagnosis not present

## 2018-02-06 DIAGNOSIS — C642 Malignant neoplasm of left kidney, except renal pelvis: Secondary | ICD-10-CM | POA: Diagnosis not present

## 2018-02-06 DIAGNOSIS — M6281 Muscle weakness (generalized): Secondary | ICD-10-CM | POA: Diagnosis not present

## 2018-02-06 DIAGNOSIS — N390 Urinary tract infection, site not specified: Secondary | ICD-10-CM | POA: Diagnosis not present

## 2018-02-06 DIAGNOSIS — I2510 Atherosclerotic heart disease of native coronary artery without angina pectoris: Secondary | ICD-10-CM | POA: Diagnosis not present

## 2018-02-06 DIAGNOSIS — E785 Hyperlipidemia, unspecified: Secondary | ICD-10-CM | POA: Diagnosis not present

## 2018-02-06 DIAGNOSIS — R1312 Dysphagia, oropharyngeal phase: Secondary | ICD-10-CM | POA: Diagnosis not present

## 2018-02-06 DIAGNOSIS — I10 Essential (primary) hypertension: Secondary | ICD-10-CM | POA: Diagnosis not present

## 2018-02-10 DIAGNOSIS — I10 Essential (primary) hypertension: Secondary | ICD-10-CM | POA: Diagnosis not present

## 2018-02-10 DIAGNOSIS — I2510 Atherosclerotic heart disease of native coronary artery without angina pectoris: Secondary | ICD-10-CM | POA: Diagnosis not present

## 2018-02-10 DIAGNOSIS — M6281 Muscle weakness (generalized): Secondary | ICD-10-CM | POA: Diagnosis not present

## 2018-02-10 DIAGNOSIS — N390 Urinary tract infection, site not specified: Secondary | ICD-10-CM | POA: Diagnosis not present

## 2018-02-10 DIAGNOSIS — R1312 Dysphagia, oropharyngeal phase: Secondary | ICD-10-CM | POA: Diagnosis not present

## 2018-02-10 DIAGNOSIS — C642 Malignant neoplasm of left kidney, except renal pelvis: Secondary | ICD-10-CM | POA: Diagnosis not present

## 2018-02-10 DIAGNOSIS — E785 Hyperlipidemia, unspecified: Secondary | ICD-10-CM | POA: Diagnosis not present

## 2018-02-11 DIAGNOSIS — I10 Essential (primary) hypertension: Secondary | ICD-10-CM | POA: Diagnosis not present

## 2018-02-11 DIAGNOSIS — E785 Hyperlipidemia, unspecified: Secondary | ICD-10-CM | POA: Diagnosis not present

## 2018-02-11 DIAGNOSIS — I2510 Atherosclerotic heart disease of native coronary artery without angina pectoris: Secondary | ICD-10-CM | POA: Diagnosis not present

## 2018-02-11 DIAGNOSIS — N390 Urinary tract infection, site not specified: Secondary | ICD-10-CM | POA: Diagnosis not present

## 2018-02-11 DIAGNOSIS — M6281 Muscle weakness (generalized): Secondary | ICD-10-CM | POA: Diagnosis not present

## 2018-02-11 DIAGNOSIS — C642 Malignant neoplasm of left kidney, except renal pelvis: Secondary | ICD-10-CM | POA: Diagnosis not present

## 2018-02-11 DIAGNOSIS — R1312 Dysphagia, oropharyngeal phase: Secondary | ICD-10-CM | POA: Diagnosis not present

## 2018-02-24 DIAGNOSIS — E785 Hyperlipidemia, unspecified: Secondary | ICD-10-CM | POA: Diagnosis not present

## 2018-02-24 DIAGNOSIS — I2510 Atherosclerotic heart disease of native coronary artery without angina pectoris: Secondary | ICD-10-CM | POA: Diagnosis not present

## 2018-02-24 DIAGNOSIS — R1312 Dysphagia, oropharyngeal phase: Secondary | ICD-10-CM | POA: Diagnosis not present

## 2018-02-24 DIAGNOSIS — I10 Essential (primary) hypertension: Secondary | ICD-10-CM | POA: Diagnosis not present

## 2018-02-24 DIAGNOSIS — N390 Urinary tract infection, site not specified: Secondary | ICD-10-CM | POA: Diagnosis not present

## 2018-02-24 DIAGNOSIS — C642 Malignant neoplasm of left kidney, except renal pelvis: Secondary | ICD-10-CM | POA: Diagnosis not present

## 2018-02-24 DIAGNOSIS — M6281 Muscle weakness (generalized): Secondary | ICD-10-CM | POA: Diagnosis not present

## 2018-02-25 DIAGNOSIS — C642 Malignant neoplasm of left kidney, except renal pelvis: Secondary | ICD-10-CM | POA: Diagnosis not present

## 2018-02-25 DIAGNOSIS — N390 Urinary tract infection, site not specified: Secondary | ICD-10-CM | POA: Diagnosis not present

## 2018-02-25 DIAGNOSIS — M6281 Muscle weakness (generalized): Secondary | ICD-10-CM | POA: Diagnosis not present

## 2018-02-25 DIAGNOSIS — E785 Hyperlipidemia, unspecified: Secondary | ICD-10-CM | POA: Diagnosis not present

## 2018-02-25 DIAGNOSIS — I10 Essential (primary) hypertension: Secondary | ICD-10-CM | POA: Diagnosis not present

## 2018-02-25 DIAGNOSIS — R1312 Dysphagia, oropharyngeal phase: Secondary | ICD-10-CM | POA: Diagnosis not present

## 2018-02-25 DIAGNOSIS — I2510 Atherosclerotic heart disease of native coronary artery without angina pectoris: Secondary | ICD-10-CM | POA: Diagnosis not present

## 2018-02-26 DIAGNOSIS — M6281 Muscle weakness (generalized): Secondary | ICD-10-CM | POA: Diagnosis not present

## 2018-02-26 DIAGNOSIS — C642 Malignant neoplasm of left kidney, except renal pelvis: Secondary | ICD-10-CM | POA: Diagnosis not present

## 2018-02-26 DIAGNOSIS — N390 Urinary tract infection, site not specified: Secondary | ICD-10-CM | POA: Diagnosis not present

## 2018-02-26 DIAGNOSIS — R1312 Dysphagia, oropharyngeal phase: Secondary | ICD-10-CM | POA: Diagnosis not present

## 2018-02-26 DIAGNOSIS — I2510 Atherosclerotic heart disease of native coronary artery without angina pectoris: Secondary | ICD-10-CM | POA: Diagnosis not present

## 2018-02-26 DIAGNOSIS — I10 Essential (primary) hypertension: Secondary | ICD-10-CM | POA: Diagnosis not present

## 2018-02-26 DIAGNOSIS — E785 Hyperlipidemia, unspecified: Secondary | ICD-10-CM | POA: Diagnosis not present

## 2018-02-27 DIAGNOSIS — M6281 Muscle weakness (generalized): Secondary | ICD-10-CM | POA: Diagnosis not present

## 2018-02-27 DIAGNOSIS — I2510 Atherosclerotic heart disease of native coronary artery without angina pectoris: Secondary | ICD-10-CM | POA: Diagnosis not present

## 2018-02-27 DIAGNOSIS — I10 Essential (primary) hypertension: Secondary | ICD-10-CM | POA: Diagnosis not present

## 2018-02-27 DIAGNOSIS — C642 Malignant neoplasm of left kidney, except renal pelvis: Secondary | ICD-10-CM | POA: Diagnosis not present

## 2018-02-27 DIAGNOSIS — N390 Urinary tract infection, site not specified: Secondary | ICD-10-CM | POA: Diagnosis not present

## 2018-02-27 DIAGNOSIS — E785 Hyperlipidemia, unspecified: Secondary | ICD-10-CM | POA: Diagnosis not present

## 2018-02-27 DIAGNOSIS — R1312 Dysphagia, oropharyngeal phase: Secondary | ICD-10-CM | POA: Diagnosis not present

## 2018-02-27 IMAGING — CT CT HEAD W/O CM
3 series · 15 of 44 positions shown, 18 images · non-contrast
Comparison: Brain MR dated 03/27/2009.

CLINICAL DATA: Fell last night.  Dementia.

EXAM:
CT HEAD WITHOUT CONTRAST
TECHNIQUE: Contiguous axial images were obtained from the base of the skull
through the vertex without intravenous contrast.

[Series 2: head wo · axial · 0.47mm/px · z∈[-193,-83]mm · 9 of 27 slices shown, 12 images]
[im 3/27  brain]
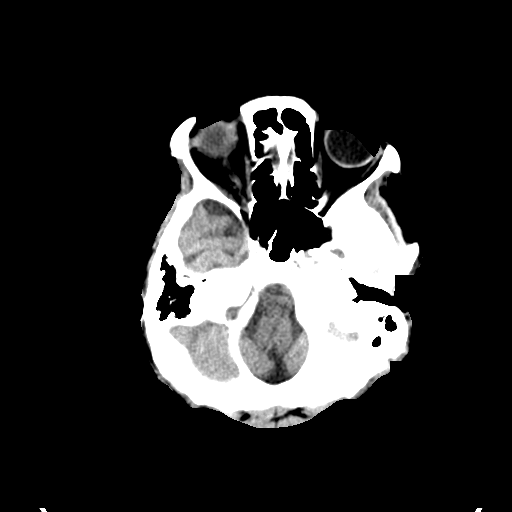
[im 3/27  bone]
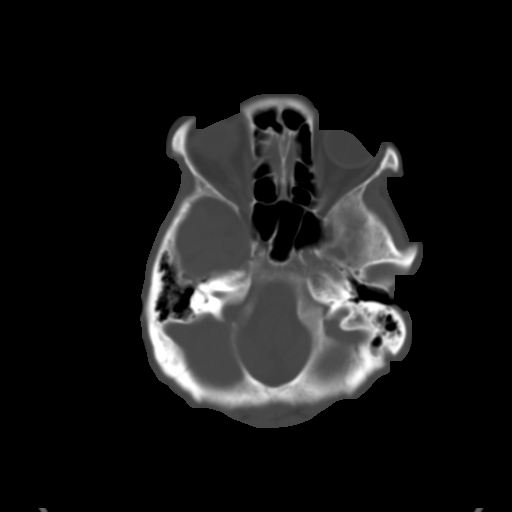
[im 6/27  brain]
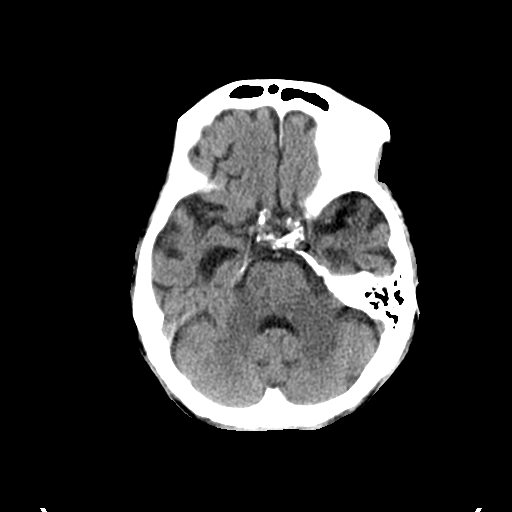
[im 8/27  brain]
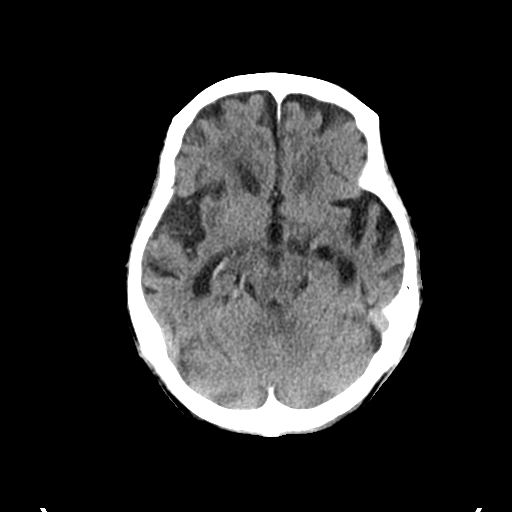
[im 11/27  brain]
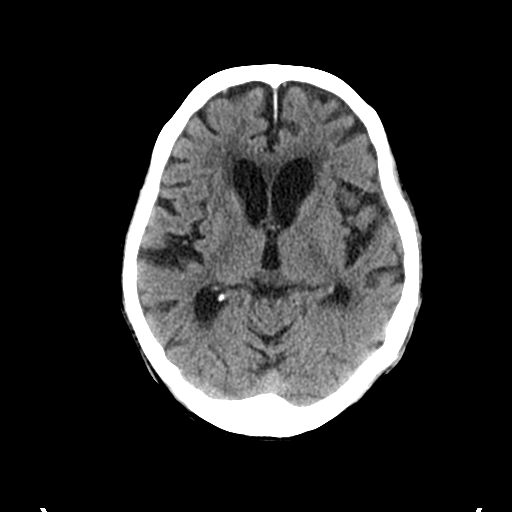
[im 14/27  brain]
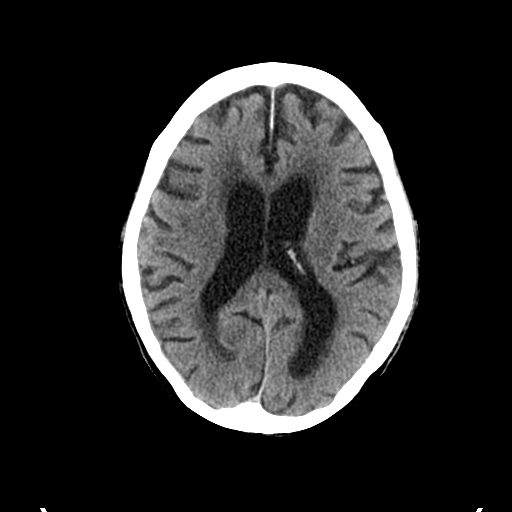
[im 14/27  bone]
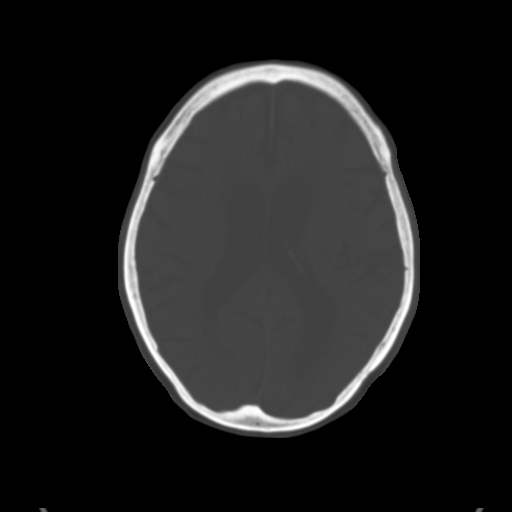
[im 17/27  brain]
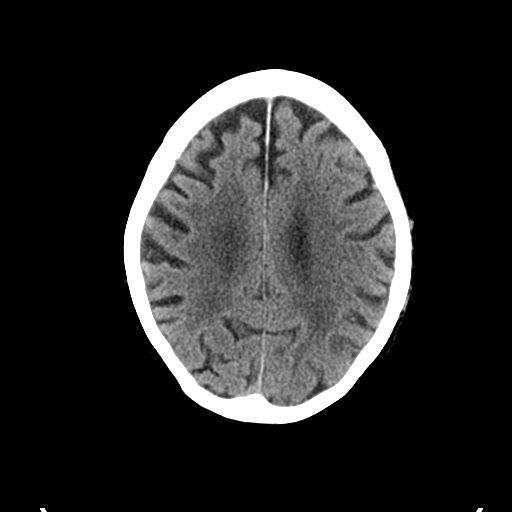
[im 20/27  brain]
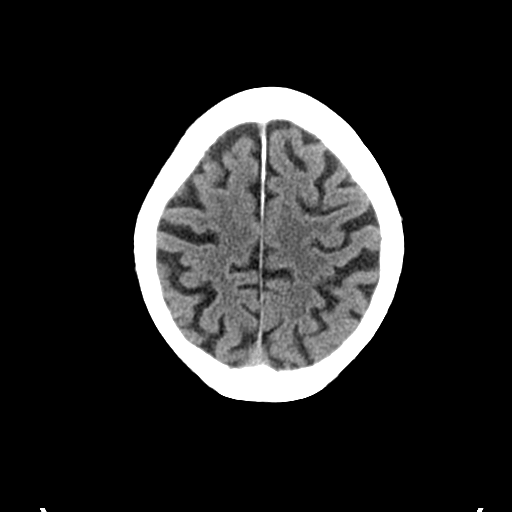
[im 22/27  brain]
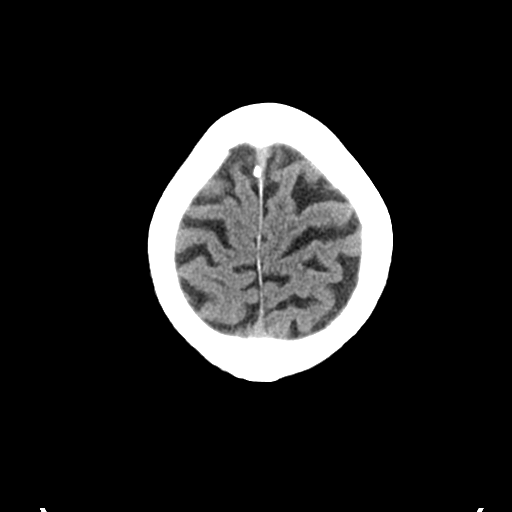
[im 25/27  brain]
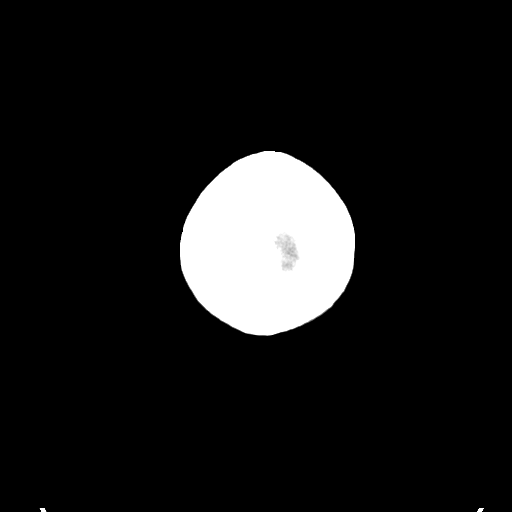
[im 25/27  bone]
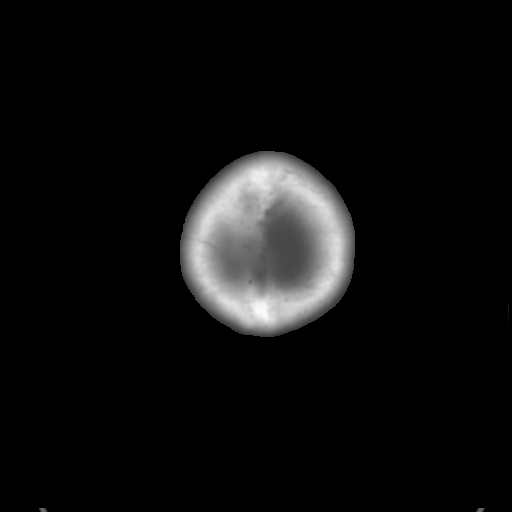

[Series 4: coronal soft tissue · coronal · 0.25mm/px · 3 of 61 slices shown]
[im 21/61  brain]
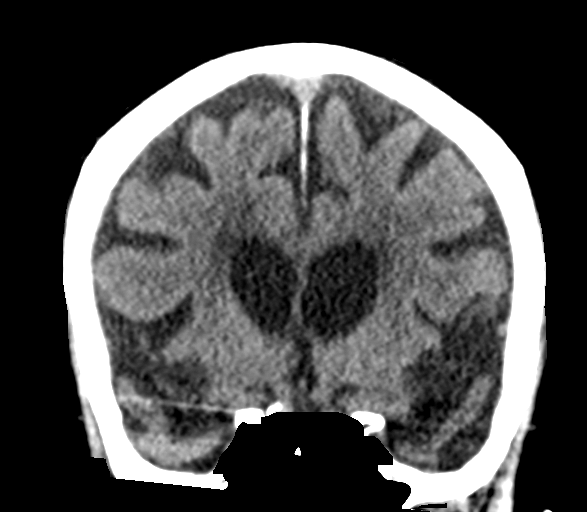
[im 27/61  brain]
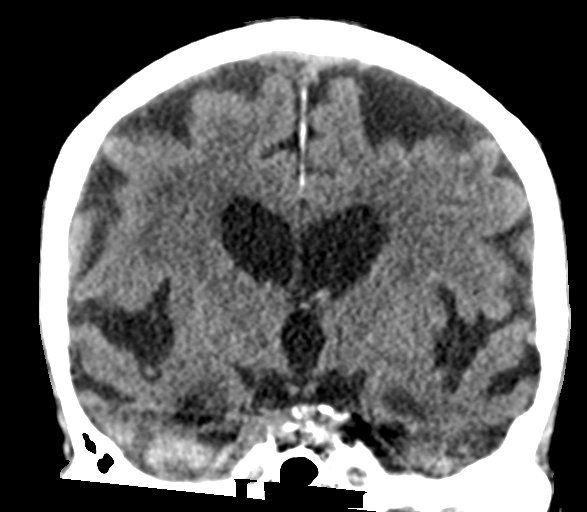
[im 34/61  brain]
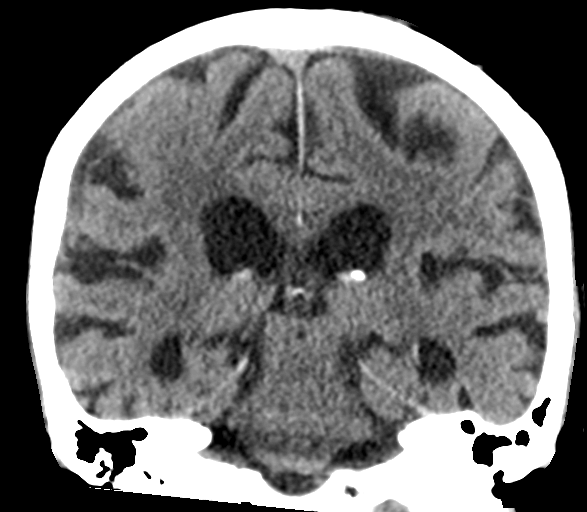

[Series 5: sagittal soft tissue · sagittal · 0.27mm/px · 3 of 46 slices shown]
[im 16/46  brain]
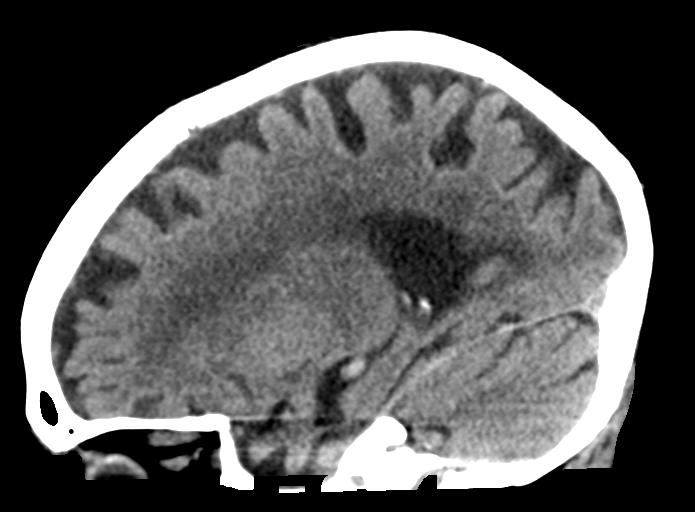
[im 23/46  brain]
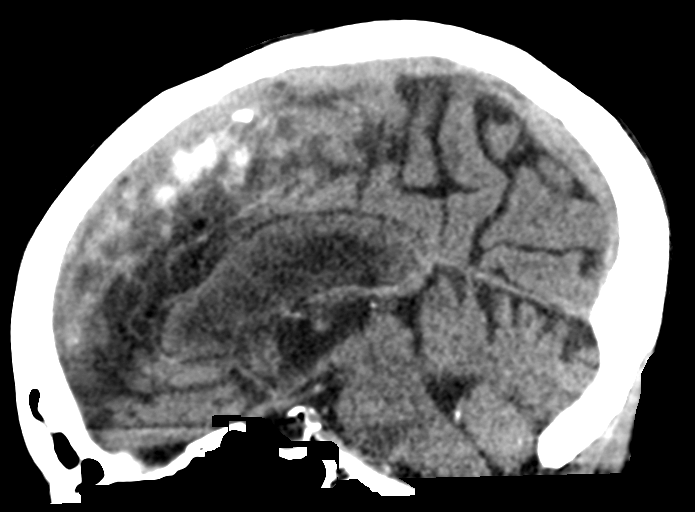
[im 31/46  brain]
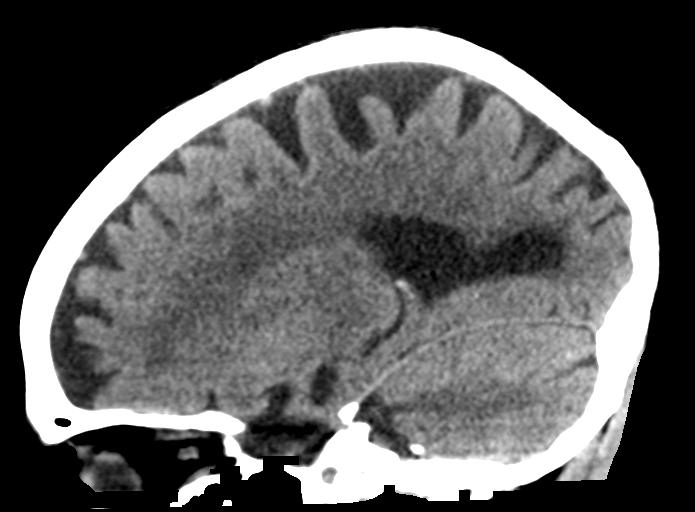

[15 of 44 positions shown; findings below may reference images not displayed]

FINDINGS: Brain: Diffusely enlarged ventricles and subarachnoid spaces. Patchy
white matter low density in both cerebral hemispheres. No
intracranial hemorrhage, mass lesion or CT evidence of acute
infarction.

Vascular: No hyperdense vessel or unexpected calcification.

Skull: Mild left mastoid mucosal thickening and small amount of
fluid. Similar findings on the previous MR.

Sinuses/Orbits: Single opacified left ethmoid air cell. Unremarkable
orbits.

Other: None.
IMPRESSION: 1. No acute abnormality.
2. Atrophy and chronic small vessel white matter ischemic changes
with progression.

## 2018-02-27 IMAGING — CR DG HIP (WITH OR WITHOUT PELVIS) 2-3V*R*
1 series · 3 of 3 positions shown · non-contrast
Comparison: 10/13/2015.

CLINICAL DATA: Right hip pain following a fall last night.

EXAM:
DG HIP (WITH OR WITHOUT PELVIS) 2-3V RIGHT

[Series 1: dg hip unilat w or w/o pelvis 2-3 views  · non-contrast · 0.14mm/px · 3 of 3 slices shown]
[im 1/3]
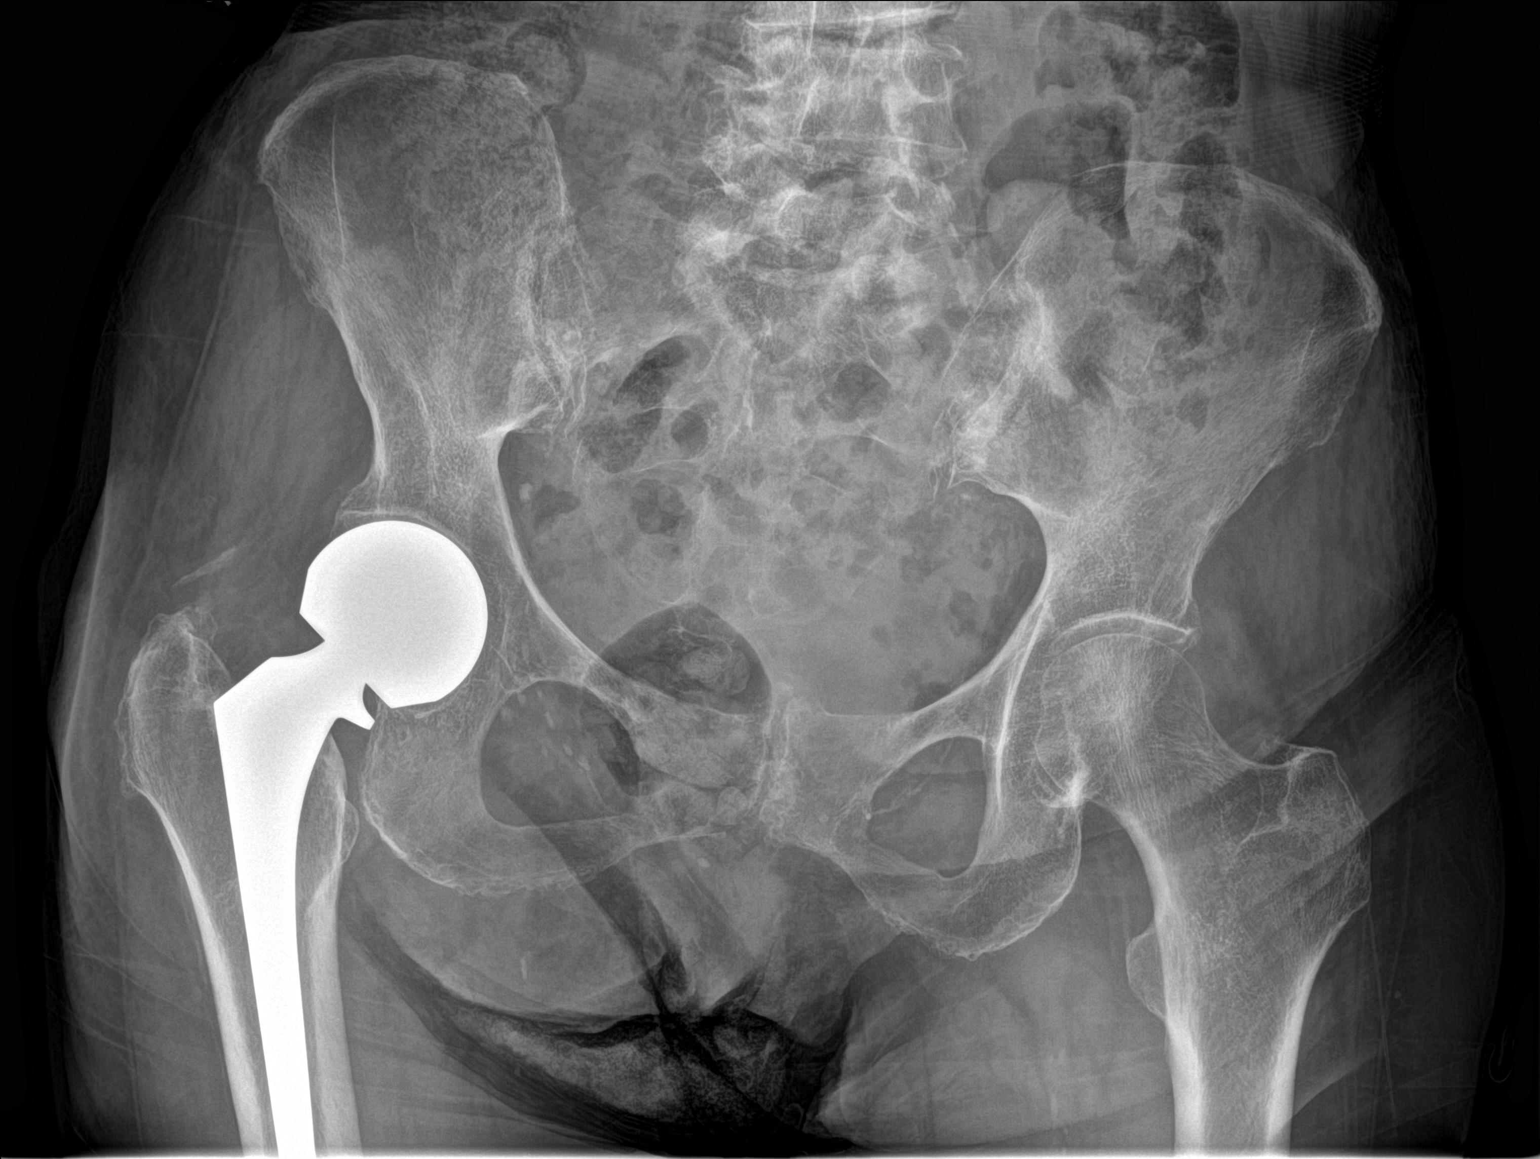
[im 2/3]
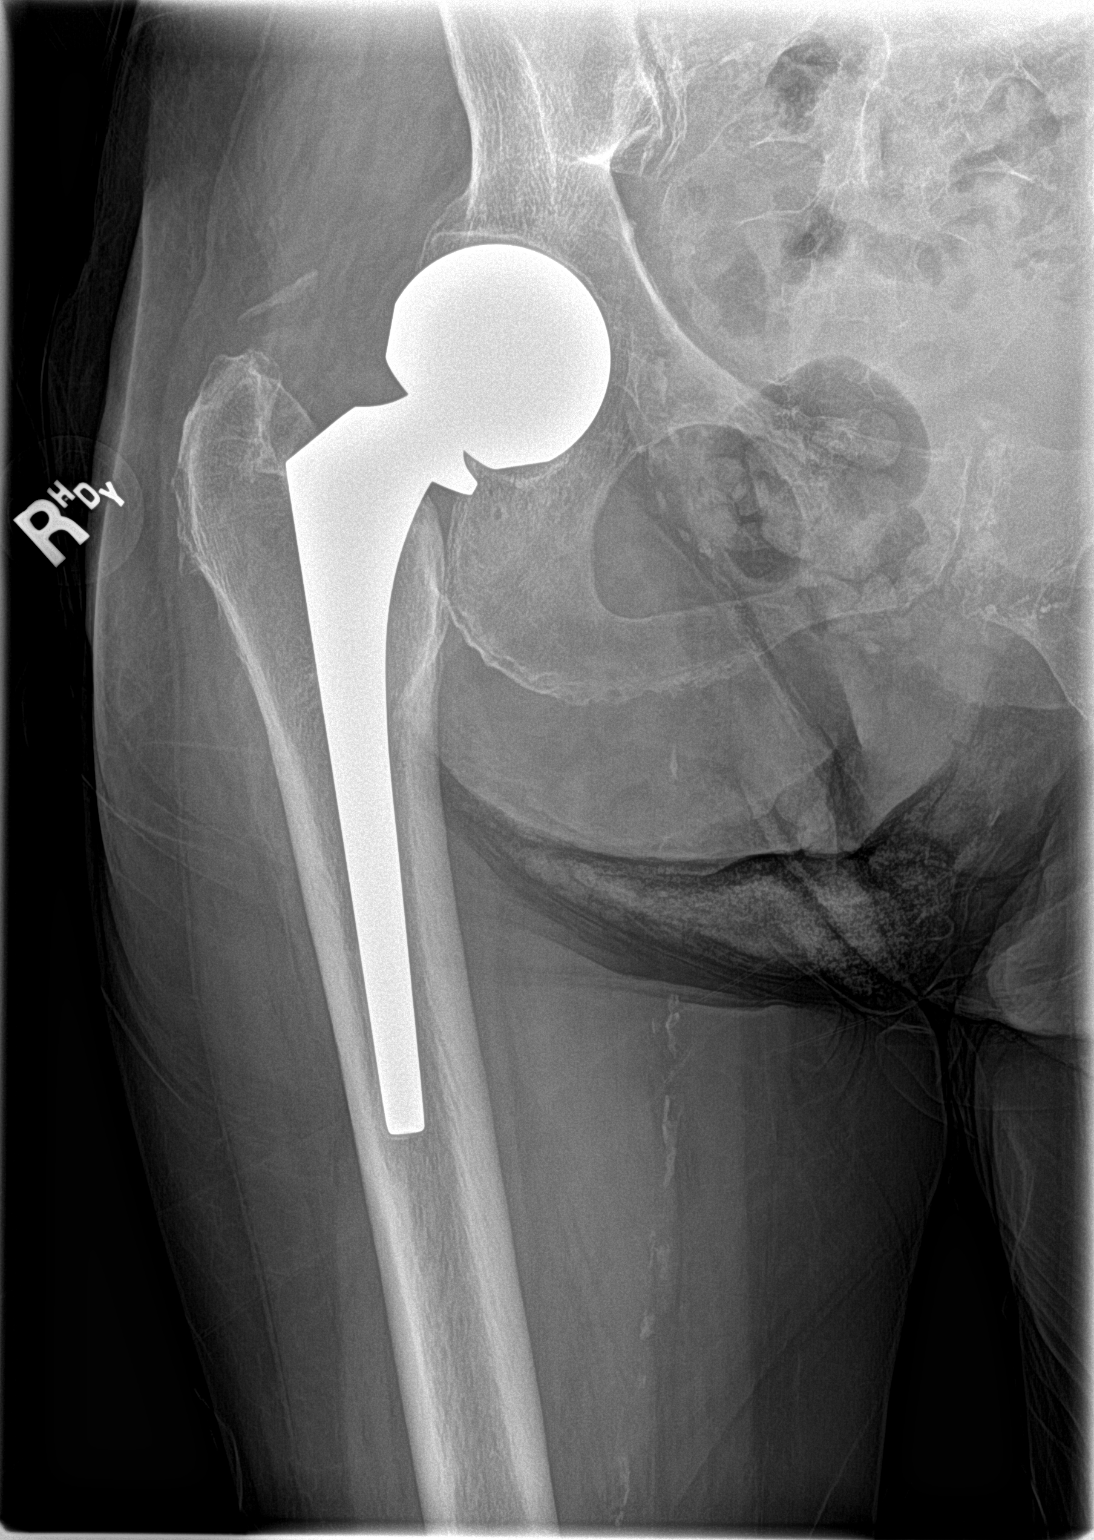
[im 3/3]
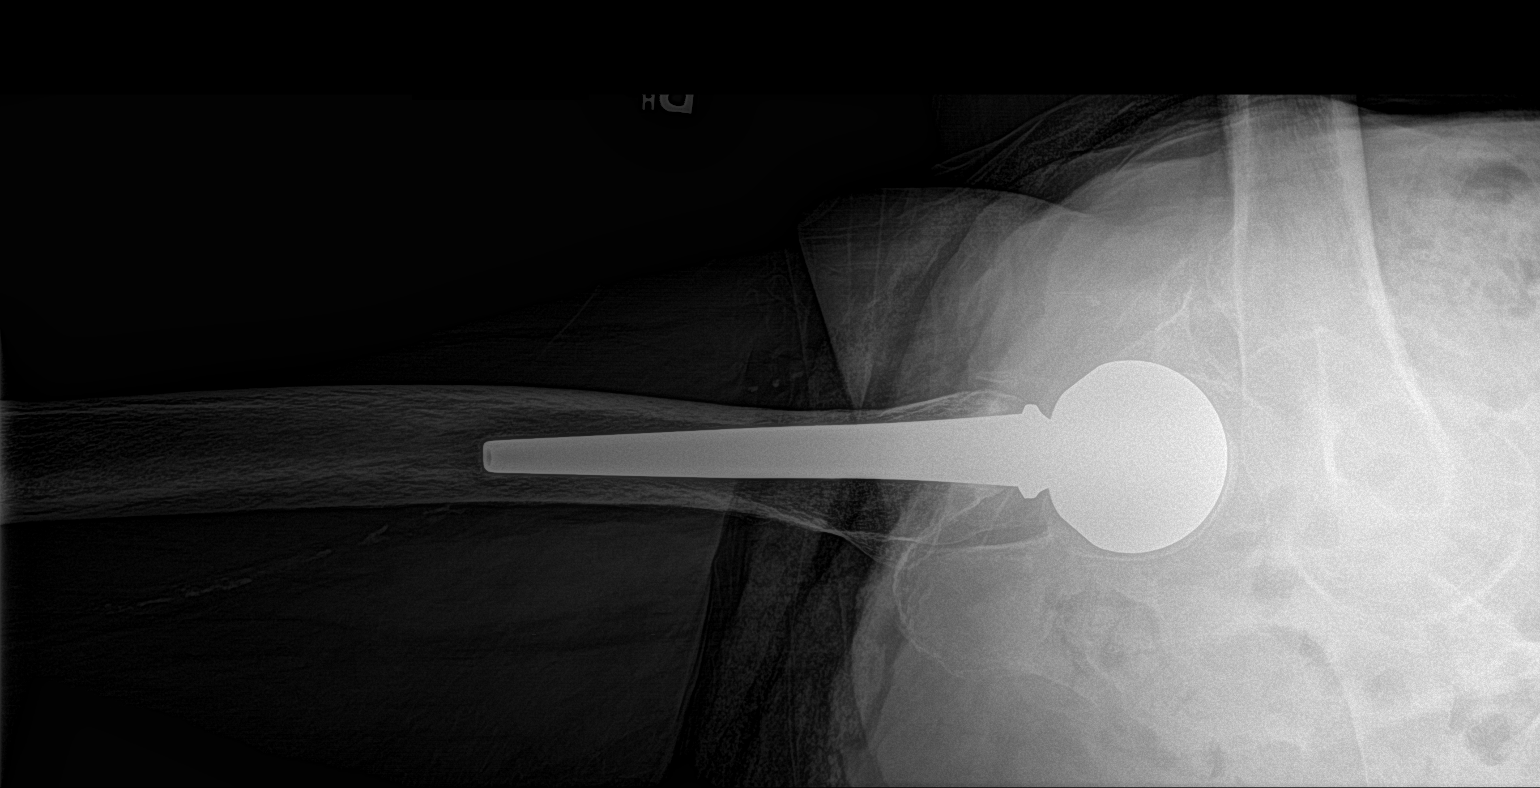

[3 of 3 positions shown; findings below may reference images not displayed]

FINDINGS: Stable right hip prosthesis. No fracture or dislocation seen.
Atheromatous arterial calcifications. Lower lumbar spine
degenerative changes.
IMPRESSION: No fracture or dislocation.

## 2018-02-28 DIAGNOSIS — R1312 Dysphagia, oropharyngeal phase: Secondary | ICD-10-CM | POA: Diagnosis not present

## 2018-02-28 DIAGNOSIS — C642 Malignant neoplasm of left kidney, except renal pelvis: Secondary | ICD-10-CM | POA: Diagnosis not present

## 2018-02-28 DIAGNOSIS — I2510 Atherosclerotic heart disease of native coronary artery without angina pectoris: Secondary | ICD-10-CM | POA: Diagnosis not present

## 2018-02-28 DIAGNOSIS — N390 Urinary tract infection, site not specified: Secondary | ICD-10-CM | POA: Diagnosis not present

## 2018-02-28 DIAGNOSIS — I10 Essential (primary) hypertension: Secondary | ICD-10-CM | POA: Diagnosis not present

## 2018-02-28 DIAGNOSIS — E785 Hyperlipidemia, unspecified: Secondary | ICD-10-CM | POA: Diagnosis not present

## 2018-02-28 DIAGNOSIS — M6281 Muscle weakness (generalized): Secondary | ICD-10-CM | POA: Diagnosis not present

## 2018-03-03 DIAGNOSIS — C642 Malignant neoplasm of left kidney, except renal pelvis: Secondary | ICD-10-CM | POA: Diagnosis not present

## 2018-03-03 DIAGNOSIS — R1312 Dysphagia, oropharyngeal phase: Secondary | ICD-10-CM | POA: Diagnosis not present

## 2018-03-03 DIAGNOSIS — E785 Hyperlipidemia, unspecified: Secondary | ICD-10-CM | POA: Diagnosis not present

## 2018-03-03 DIAGNOSIS — N390 Urinary tract infection, site not specified: Secondary | ICD-10-CM | POA: Diagnosis not present

## 2018-03-03 DIAGNOSIS — I2510 Atherosclerotic heart disease of native coronary artery without angina pectoris: Secondary | ICD-10-CM | POA: Diagnosis not present

## 2018-03-03 DIAGNOSIS — I10 Essential (primary) hypertension: Secondary | ICD-10-CM | POA: Diagnosis not present

## 2018-03-03 DIAGNOSIS — M6281 Muscle weakness (generalized): Secondary | ICD-10-CM | POA: Diagnosis not present

## 2018-03-04 DIAGNOSIS — C642 Malignant neoplasm of left kidney, except renal pelvis: Secondary | ICD-10-CM | POA: Diagnosis not present

## 2018-03-04 DIAGNOSIS — I10 Essential (primary) hypertension: Secondary | ICD-10-CM | POA: Diagnosis not present

## 2018-03-04 DIAGNOSIS — N390 Urinary tract infection, site not specified: Secondary | ICD-10-CM | POA: Diagnosis not present

## 2018-03-04 DIAGNOSIS — R1312 Dysphagia, oropharyngeal phase: Secondary | ICD-10-CM | POA: Diagnosis not present

## 2018-03-04 DIAGNOSIS — I2510 Atherosclerotic heart disease of native coronary artery without angina pectoris: Secondary | ICD-10-CM | POA: Diagnosis not present

## 2018-03-04 DIAGNOSIS — E785 Hyperlipidemia, unspecified: Secondary | ICD-10-CM | POA: Diagnosis not present

## 2018-03-04 DIAGNOSIS — M6281 Muscle weakness (generalized): Secondary | ICD-10-CM | POA: Diagnosis not present

## 2018-03-05 DIAGNOSIS — I2510 Atherosclerotic heart disease of native coronary artery without angina pectoris: Secondary | ICD-10-CM | POA: Diagnosis not present

## 2018-03-05 DIAGNOSIS — C642 Malignant neoplasm of left kidney, except renal pelvis: Secondary | ICD-10-CM | POA: Diagnosis not present

## 2018-03-05 DIAGNOSIS — M6281 Muscle weakness (generalized): Secondary | ICD-10-CM | POA: Diagnosis not present

## 2018-03-05 DIAGNOSIS — R1312 Dysphagia, oropharyngeal phase: Secondary | ICD-10-CM | POA: Diagnosis not present

## 2018-03-05 DIAGNOSIS — E785 Hyperlipidemia, unspecified: Secondary | ICD-10-CM | POA: Diagnosis not present

## 2018-03-05 DIAGNOSIS — N390 Urinary tract infection, site not specified: Secondary | ICD-10-CM | POA: Diagnosis not present

## 2018-03-05 DIAGNOSIS — I10 Essential (primary) hypertension: Secondary | ICD-10-CM | POA: Diagnosis not present

## 2018-03-24 DIAGNOSIS — D381 Neoplasm of uncertain behavior of trachea, bronchus and lung: Secondary | ICD-10-CM | POA: Diagnosis not present

## 2018-03-24 DIAGNOSIS — F0390 Unspecified dementia without behavioral disturbance: Secondary | ICD-10-CM | POA: Diagnosis not present

## 2018-03-24 DIAGNOSIS — D4100 Neoplasm of uncertain behavior of unspecified kidney: Secondary | ICD-10-CM | POA: Diagnosis not present

## 2018-03-24 DIAGNOSIS — R634 Abnormal weight loss: Secondary | ICD-10-CM | POA: Diagnosis not present

## 2018-03-24 DIAGNOSIS — I10 Essential (primary) hypertension: Secondary | ICD-10-CM | POA: Diagnosis not present

## 2018-03-24 DIAGNOSIS — M5489 Other dorsalgia: Secondary | ICD-10-CM | POA: Diagnosis not present

## 2018-03-24 DIAGNOSIS — E785 Hyperlipidemia, unspecified: Secondary | ICD-10-CM | POA: Diagnosis not present

## 2018-03-26 DIAGNOSIS — E785 Hyperlipidemia, unspecified: Secondary | ICD-10-CM | POA: Diagnosis not present

## 2018-03-26 DIAGNOSIS — I2510 Atherosclerotic heart disease of native coronary artery without angina pectoris: Secondary | ICD-10-CM | POA: Diagnosis not present

## 2018-03-26 DIAGNOSIS — N390 Urinary tract infection, site not specified: Secondary | ICD-10-CM | POA: Diagnosis not present

## 2018-03-26 DIAGNOSIS — C642 Malignant neoplasm of left kidney, except renal pelvis: Secondary | ICD-10-CM | POA: Diagnosis not present

## 2018-03-26 DIAGNOSIS — M6281 Muscle weakness (generalized): Secondary | ICD-10-CM | POA: Diagnosis not present

## 2018-03-26 DIAGNOSIS — R1312 Dysphagia, oropharyngeal phase: Secondary | ICD-10-CM | POA: Diagnosis not present

## 2018-03-26 DIAGNOSIS — I10 Essential (primary) hypertension: Secondary | ICD-10-CM | POA: Diagnosis not present

## 2018-03-27 DIAGNOSIS — I2510 Atherosclerotic heart disease of native coronary artery without angina pectoris: Secondary | ICD-10-CM | POA: Diagnosis not present

## 2018-03-27 DIAGNOSIS — I10 Essential (primary) hypertension: Secondary | ICD-10-CM | POA: Diagnosis not present

## 2018-03-27 DIAGNOSIS — M6281 Muscle weakness (generalized): Secondary | ICD-10-CM | POA: Diagnosis not present

## 2018-03-27 DIAGNOSIS — E785 Hyperlipidemia, unspecified: Secondary | ICD-10-CM | POA: Diagnosis not present

## 2018-03-27 DIAGNOSIS — R1312 Dysphagia, oropharyngeal phase: Secondary | ICD-10-CM | POA: Diagnosis not present

## 2018-03-27 DIAGNOSIS — N390 Urinary tract infection, site not specified: Secondary | ICD-10-CM | POA: Diagnosis not present

## 2018-03-27 DIAGNOSIS — C642 Malignant neoplasm of left kidney, except renal pelvis: Secondary | ICD-10-CM | POA: Diagnosis not present

## 2018-03-28 DIAGNOSIS — R1312 Dysphagia, oropharyngeal phase: Secondary | ICD-10-CM | POA: Diagnosis not present

## 2018-03-28 DIAGNOSIS — I2510 Atherosclerotic heart disease of native coronary artery without angina pectoris: Secondary | ICD-10-CM | POA: Diagnosis not present

## 2018-03-28 DIAGNOSIS — E785 Hyperlipidemia, unspecified: Secondary | ICD-10-CM | POA: Diagnosis not present

## 2018-03-28 DIAGNOSIS — M6281 Muscle weakness (generalized): Secondary | ICD-10-CM | POA: Diagnosis not present

## 2018-03-28 DIAGNOSIS — N390 Urinary tract infection, site not specified: Secondary | ICD-10-CM | POA: Diagnosis not present

## 2018-03-28 DIAGNOSIS — C642 Malignant neoplasm of left kidney, except renal pelvis: Secondary | ICD-10-CM | POA: Diagnosis not present

## 2018-03-28 DIAGNOSIS — I10 Essential (primary) hypertension: Secondary | ICD-10-CM | POA: Diagnosis not present

## 2018-03-31 DIAGNOSIS — I2510 Atherosclerotic heart disease of native coronary artery without angina pectoris: Secondary | ICD-10-CM | POA: Diagnosis not present

## 2018-03-31 DIAGNOSIS — I10 Essential (primary) hypertension: Secondary | ICD-10-CM | POA: Diagnosis not present

## 2018-03-31 DIAGNOSIS — C642 Malignant neoplasm of left kidney, except renal pelvis: Secondary | ICD-10-CM | POA: Diagnosis not present

## 2018-03-31 DIAGNOSIS — R1312 Dysphagia, oropharyngeal phase: Secondary | ICD-10-CM | POA: Diagnosis not present

## 2018-03-31 DIAGNOSIS — M6281 Muscle weakness (generalized): Secondary | ICD-10-CM | POA: Diagnosis not present

## 2018-03-31 DIAGNOSIS — E785 Hyperlipidemia, unspecified: Secondary | ICD-10-CM | POA: Diagnosis not present

## 2018-03-31 DIAGNOSIS — N390 Urinary tract infection, site not specified: Secondary | ICD-10-CM | POA: Diagnosis not present

## 2018-04-02 DIAGNOSIS — L603 Nail dystrophy: Secondary | ICD-10-CM | POA: Diagnosis not present

## 2018-04-02 DIAGNOSIS — L84 Corns and callosities: Secondary | ICD-10-CM | POA: Diagnosis not present

## 2018-04-02 DIAGNOSIS — I739 Peripheral vascular disease, unspecified: Secondary | ICD-10-CM | POA: Diagnosis not present

## 2018-04-02 DIAGNOSIS — B351 Tinea unguium: Secondary | ICD-10-CM | POA: Diagnosis not present

## 2018-04-03 DIAGNOSIS — I10 Essential (primary) hypertension: Secondary | ICD-10-CM | POA: Diagnosis not present

## 2018-04-03 DIAGNOSIS — C642 Malignant neoplasm of left kidney, except renal pelvis: Secondary | ICD-10-CM | POA: Diagnosis not present

## 2018-04-03 DIAGNOSIS — M6281 Muscle weakness (generalized): Secondary | ICD-10-CM | POA: Diagnosis not present

## 2018-04-03 DIAGNOSIS — E785 Hyperlipidemia, unspecified: Secondary | ICD-10-CM | POA: Diagnosis not present

## 2018-04-03 DIAGNOSIS — R1312 Dysphagia, oropharyngeal phase: Secondary | ICD-10-CM | POA: Diagnosis not present

## 2018-04-03 DIAGNOSIS — I2510 Atherosclerotic heart disease of native coronary artery without angina pectoris: Secondary | ICD-10-CM | POA: Diagnosis not present

## 2018-04-03 DIAGNOSIS — N390 Urinary tract infection, site not specified: Secondary | ICD-10-CM | POA: Diagnosis not present

## 2018-05-25 DIAGNOSIS — F0390 Unspecified dementia without behavioral disturbance: Secondary | ICD-10-CM | POA: Diagnosis not present

## 2018-05-25 DIAGNOSIS — Z8739 Personal history of other diseases of the musculoskeletal system and connective tissue: Secondary | ICD-10-CM | POA: Diagnosis not present

## 2018-05-25 DIAGNOSIS — E785 Hyperlipidemia, unspecified: Secondary | ICD-10-CM | POA: Diagnosis not present

## 2018-05-25 DIAGNOSIS — D4100 Neoplasm of uncertain behavior of unspecified kidney: Secondary | ICD-10-CM | POA: Diagnosis not present

## 2018-05-25 DIAGNOSIS — I10 Essential (primary) hypertension: Secondary | ICD-10-CM | POA: Diagnosis not present

## 2018-05-25 DIAGNOSIS — D381 Neoplasm of uncertain behavior of trachea, bronchus and lung: Secondary | ICD-10-CM | POA: Diagnosis not present

## 2018-05-28 DIAGNOSIS — C642 Malignant neoplasm of left kidney, except renal pelvis: Secondary | ICD-10-CM | POA: Diagnosis not present

## 2018-05-28 DIAGNOSIS — E785 Hyperlipidemia, unspecified: Secondary | ICD-10-CM | POA: Diagnosis not present

## 2018-05-28 DIAGNOSIS — M6281 Muscle weakness (generalized): Secondary | ICD-10-CM | POA: Diagnosis not present

## 2018-05-28 DIAGNOSIS — I10 Essential (primary) hypertension: Secondary | ICD-10-CM | POA: Diagnosis not present

## 2018-05-28 DIAGNOSIS — I2510 Atherosclerotic heart disease of native coronary artery without angina pectoris: Secondary | ICD-10-CM | POA: Diagnosis not present

## 2018-05-29 DIAGNOSIS — C642 Malignant neoplasm of left kidney, except renal pelvis: Secondary | ICD-10-CM | POA: Diagnosis not present

## 2018-05-29 DIAGNOSIS — E785 Hyperlipidemia, unspecified: Secondary | ICD-10-CM | POA: Diagnosis not present

## 2018-05-29 DIAGNOSIS — I10 Essential (primary) hypertension: Secondary | ICD-10-CM | POA: Diagnosis not present

## 2018-05-29 DIAGNOSIS — M6281 Muscle weakness (generalized): Secondary | ICD-10-CM | POA: Diagnosis not present

## 2018-05-29 DIAGNOSIS — I2510 Atherosclerotic heart disease of native coronary artery without angina pectoris: Secondary | ICD-10-CM | POA: Diagnosis not present

## 2018-05-30 DIAGNOSIS — C642 Malignant neoplasm of left kidney, except renal pelvis: Secondary | ICD-10-CM | POA: Diagnosis not present

## 2018-05-30 DIAGNOSIS — I10 Essential (primary) hypertension: Secondary | ICD-10-CM | POA: Diagnosis not present

## 2018-05-30 DIAGNOSIS — M6281 Muscle weakness (generalized): Secondary | ICD-10-CM | POA: Diagnosis not present

## 2018-05-30 DIAGNOSIS — E785 Hyperlipidemia, unspecified: Secondary | ICD-10-CM | POA: Diagnosis not present

## 2018-05-30 DIAGNOSIS — I2510 Atherosclerotic heart disease of native coronary artery without angina pectoris: Secondary | ICD-10-CM | POA: Diagnosis not present

## 2018-06-02 DIAGNOSIS — I2510 Atherosclerotic heart disease of native coronary artery without angina pectoris: Secondary | ICD-10-CM | POA: Diagnosis not present

## 2018-06-02 DIAGNOSIS — I10 Essential (primary) hypertension: Secondary | ICD-10-CM | POA: Diagnosis not present

## 2018-06-02 DIAGNOSIS — A000 Cholera due to Vibrio cholerae 01, biovar cholerae: Secondary | ICD-10-CM | POA: Diagnosis not present

## 2018-06-02 DIAGNOSIS — E785 Hyperlipidemia, unspecified: Secondary | ICD-10-CM | POA: Diagnosis not present

## 2018-06-02 DIAGNOSIS — M6281 Muscle weakness (generalized): Secondary | ICD-10-CM | POA: Diagnosis not present

## 2018-06-02 DIAGNOSIS — C642 Malignant neoplasm of left kidney, except renal pelvis: Secondary | ICD-10-CM | POA: Diagnosis not present

## 2018-06-02 DIAGNOSIS — C641 Malignant neoplasm of right kidney, except renal pelvis: Secondary | ICD-10-CM | POA: Diagnosis not present

## 2018-06-02 DIAGNOSIS — E560 Deficiency of vitamin E: Secondary | ICD-10-CM | POA: Diagnosis not present

## 2018-06-03 DIAGNOSIS — C642 Malignant neoplasm of left kidney, except renal pelvis: Secondary | ICD-10-CM | POA: Diagnosis not present

## 2018-06-03 DIAGNOSIS — I10 Essential (primary) hypertension: Secondary | ICD-10-CM | POA: Diagnosis not present

## 2018-06-03 DIAGNOSIS — M6281 Muscle weakness (generalized): Secondary | ICD-10-CM | POA: Diagnosis not present

## 2018-06-03 DIAGNOSIS — I2510 Atherosclerotic heart disease of native coronary artery without angina pectoris: Secondary | ICD-10-CM | POA: Diagnosis not present

## 2018-06-03 DIAGNOSIS — E785 Hyperlipidemia, unspecified: Secondary | ICD-10-CM | POA: Diagnosis not present

## 2018-06-25 DIAGNOSIS — Z1159 Encounter for screening for other viral diseases: Secondary | ICD-10-CM | POA: Diagnosis not present

## 2018-07-09 DIAGNOSIS — L603 Nail dystrophy: Secondary | ICD-10-CM | POA: Diagnosis not present

## 2018-07-09 DIAGNOSIS — B351 Tinea unguium: Secondary | ICD-10-CM | POA: Diagnosis not present

## 2018-07-09 DIAGNOSIS — I739 Peripheral vascular disease, unspecified: Secondary | ICD-10-CM | POA: Diagnosis not present

## 2018-07-18 DIAGNOSIS — C642 Malignant neoplasm of left kidney, except renal pelvis: Secondary | ICD-10-CM | POA: Diagnosis not present

## 2018-07-18 DIAGNOSIS — R1312 Dysphagia, oropharyngeal phase: Secondary | ICD-10-CM | POA: Diagnosis not present

## 2018-07-18 DIAGNOSIS — I10 Essential (primary) hypertension: Secondary | ICD-10-CM | POA: Diagnosis not present

## 2018-07-18 DIAGNOSIS — G309 Alzheimer's disease, unspecified: Secondary | ICD-10-CM | POA: Diagnosis not present

## 2018-07-18 DIAGNOSIS — E785 Hyperlipidemia, unspecified: Secondary | ICD-10-CM | POA: Diagnosis not present

## 2018-07-18 DIAGNOSIS — M24562 Contracture, left knee: Secondary | ICD-10-CM | POA: Diagnosis not present

## 2018-07-18 DIAGNOSIS — M24561 Contracture, right knee: Secondary | ICD-10-CM | POA: Diagnosis not present

## 2018-07-18 DIAGNOSIS — I2510 Atherosclerotic heart disease of native coronary artery without angina pectoris: Secondary | ICD-10-CM | POA: Diagnosis not present

## 2018-07-26 DIAGNOSIS — I10 Essential (primary) hypertension: Secondary | ICD-10-CM | POA: Diagnosis not present

## 2018-07-26 DIAGNOSIS — Z8739 Personal history of other diseases of the musculoskeletal system and connective tissue: Secondary | ICD-10-CM | POA: Diagnosis not present

## 2018-07-26 DIAGNOSIS — E785 Hyperlipidemia, unspecified: Secondary | ICD-10-CM | POA: Diagnosis not present

## 2018-07-26 DIAGNOSIS — D381 Neoplasm of uncertain behavior of trachea, bronchus and lung: Secondary | ICD-10-CM | POA: Diagnosis not present

## 2018-07-26 DIAGNOSIS — D4100 Neoplasm of uncertain behavior of unspecified kidney: Secondary | ICD-10-CM | POA: Diagnosis not present

## 2018-07-26 DIAGNOSIS — F0390 Unspecified dementia without behavioral disturbance: Secondary | ICD-10-CM | POA: Diagnosis not present

## 2018-07-28 DIAGNOSIS — M24561 Contracture, right knee: Secondary | ICD-10-CM | POA: Diagnosis not present

## 2018-07-28 DIAGNOSIS — R1312 Dysphagia, oropharyngeal phase: Secondary | ICD-10-CM | POA: Diagnosis not present

## 2018-07-28 DIAGNOSIS — C642 Malignant neoplasm of left kidney, except renal pelvis: Secondary | ICD-10-CM | POA: Diagnosis not present

## 2018-07-28 DIAGNOSIS — M24562 Contracture, left knee: Secondary | ICD-10-CM | POA: Diagnosis not present

## 2018-07-28 DIAGNOSIS — E785 Hyperlipidemia, unspecified: Secondary | ICD-10-CM | POA: Diagnosis not present

## 2018-07-28 DIAGNOSIS — I2510 Atherosclerotic heart disease of native coronary artery without angina pectoris: Secondary | ICD-10-CM | POA: Diagnosis not present

## 2018-07-28 DIAGNOSIS — G309 Alzheimer's disease, unspecified: Secondary | ICD-10-CM | POA: Diagnosis not present

## 2018-07-28 DIAGNOSIS — I10 Essential (primary) hypertension: Secondary | ICD-10-CM | POA: Diagnosis not present

## 2018-07-29 DIAGNOSIS — M24562 Contracture, left knee: Secondary | ICD-10-CM | POA: Diagnosis not present

## 2018-07-29 DIAGNOSIS — C642 Malignant neoplasm of left kidney, except renal pelvis: Secondary | ICD-10-CM | POA: Diagnosis not present

## 2018-07-29 DIAGNOSIS — M24561 Contracture, right knee: Secondary | ICD-10-CM | POA: Diagnosis not present

## 2018-07-29 DIAGNOSIS — I2510 Atherosclerotic heart disease of native coronary artery without angina pectoris: Secondary | ICD-10-CM | POA: Diagnosis not present

## 2018-07-29 DIAGNOSIS — G309 Alzheimer's disease, unspecified: Secondary | ICD-10-CM | POA: Diagnosis not present

## 2018-07-29 DIAGNOSIS — R1312 Dysphagia, oropharyngeal phase: Secondary | ICD-10-CM | POA: Diagnosis not present

## 2018-07-29 DIAGNOSIS — I10 Essential (primary) hypertension: Secondary | ICD-10-CM | POA: Diagnosis not present

## 2018-07-29 DIAGNOSIS — E785 Hyperlipidemia, unspecified: Secondary | ICD-10-CM | POA: Diagnosis not present

## 2018-07-30 DIAGNOSIS — M24561 Contracture, right knee: Secondary | ICD-10-CM | POA: Diagnosis not present

## 2018-07-30 DIAGNOSIS — G309 Alzheimer's disease, unspecified: Secondary | ICD-10-CM | POA: Diagnosis not present

## 2018-07-30 DIAGNOSIS — R1312 Dysphagia, oropharyngeal phase: Secondary | ICD-10-CM | POA: Diagnosis not present

## 2018-07-30 DIAGNOSIS — C642 Malignant neoplasm of left kidney, except renal pelvis: Secondary | ICD-10-CM | POA: Diagnosis not present

## 2018-07-30 DIAGNOSIS — M24562 Contracture, left knee: Secondary | ICD-10-CM | POA: Diagnosis not present

## 2018-07-30 DIAGNOSIS — I10 Essential (primary) hypertension: Secondary | ICD-10-CM | POA: Diagnosis not present

## 2018-07-30 DIAGNOSIS — E785 Hyperlipidemia, unspecified: Secondary | ICD-10-CM | POA: Diagnosis not present

## 2018-07-30 DIAGNOSIS — I2510 Atherosclerotic heart disease of native coronary artery without angina pectoris: Secondary | ICD-10-CM | POA: Diagnosis not present

## 2018-07-31 DIAGNOSIS — G309 Alzheimer's disease, unspecified: Secondary | ICD-10-CM | POA: Diagnosis not present

## 2018-07-31 DIAGNOSIS — E785 Hyperlipidemia, unspecified: Secondary | ICD-10-CM | POA: Diagnosis not present

## 2018-07-31 DIAGNOSIS — R1312 Dysphagia, oropharyngeal phase: Secondary | ICD-10-CM | POA: Diagnosis not present

## 2018-07-31 DIAGNOSIS — I10 Essential (primary) hypertension: Secondary | ICD-10-CM | POA: Diagnosis not present

## 2018-07-31 DIAGNOSIS — C642 Malignant neoplasm of left kidney, except renal pelvis: Secondary | ICD-10-CM | POA: Diagnosis not present

## 2018-07-31 DIAGNOSIS — M24562 Contracture, left knee: Secondary | ICD-10-CM | POA: Diagnosis not present

## 2018-07-31 DIAGNOSIS — M24561 Contracture, right knee: Secondary | ICD-10-CM | POA: Diagnosis not present

## 2018-07-31 DIAGNOSIS — I2510 Atherosclerotic heart disease of native coronary artery without angina pectoris: Secondary | ICD-10-CM | POA: Diagnosis not present

## 2018-08-01 DIAGNOSIS — C642 Malignant neoplasm of left kidney, except renal pelvis: Secondary | ICD-10-CM | POA: Diagnosis not present

## 2018-08-01 DIAGNOSIS — R1312 Dysphagia, oropharyngeal phase: Secondary | ICD-10-CM | POA: Diagnosis not present

## 2018-08-01 DIAGNOSIS — I2510 Atherosclerotic heart disease of native coronary artery without angina pectoris: Secondary | ICD-10-CM | POA: Diagnosis not present

## 2018-08-01 DIAGNOSIS — M24562 Contracture, left knee: Secondary | ICD-10-CM | POA: Diagnosis not present

## 2018-08-01 DIAGNOSIS — I10 Essential (primary) hypertension: Secondary | ICD-10-CM | POA: Diagnosis not present

## 2018-08-01 DIAGNOSIS — M24561 Contracture, right knee: Secondary | ICD-10-CM | POA: Diagnosis not present

## 2018-08-01 DIAGNOSIS — E785 Hyperlipidemia, unspecified: Secondary | ICD-10-CM | POA: Diagnosis not present

## 2018-08-01 DIAGNOSIS — G309 Alzheimer's disease, unspecified: Secondary | ICD-10-CM | POA: Diagnosis not present

## 2018-08-04 DIAGNOSIS — G309 Alzheimer's disease, unspecified: Secondary | ICD-10-CM | POA: Diagnosis not present

## 2018-08-04 DIAGNOSIS — I10 Essential (primary) hypertension: Secondary | ICD-10-CM | POA: Diagnosis not present

## 2018-08-04 DIAGNOSIS — C642 Malignant neoplasm of left kidney, except renal pelvis: Secondary | ICD-10-CM | POA: Diagnosis not present

## 2018-08-04 DIAGNOSIS — E785 Hyperlipidemia, unspecified: Secondary | ICD-10-CM | POA: Diagnosis not present

## 2018-08-04 DIAGNOSIS — R1312 Dysphagia, oropharyngeal phase: Secondary | ICD-10-CM | POA: Diagnosis not present

## 2018-08-04 DIAGNOSIS — M24561 Contracture, right knee: Secondary | ICD-10-CM | POA: Diagnosis not present

## 2018-08-04 DIAGNOSIS — M24562 Contracture, left knee: Secondary | ICD-10-CM | POA: Diagnosis not present

## 2018-08-04 DIAGNOSIS — I2510 Atherosclerotic heart disease of native coronary artery without angina pectoris: Secondary | ICD-10-CM | POA: Diagnosis not present

## 2018-08-05 DIAGNOSIS — E785 Hyperlipidemia, unspecified: Secondary | ICD-10-CM | POA: Diagnosis not present

## 2018-08-05 DIAGNOSIS — C642 Malignant neoplasm of left kidney, except renal pelvis: Secondary | ICD-10-CM | POA: Diagnosis not present

## 2018-08-05 DIAGNOSIS — R1312 Dysphagia, oropharyngeal phase: Secondary | ICD-10-CM | POA: Diagnosis not present

## 2018-08-05 DIAGNOSIS — G309 Alzheimer's disease, unspecified: Secondary | ICD-10-CM | POA: Diagnosis not present

## 2018-08-05 DIAGNOSIS — M24562 Contracture, left knee: Secondary | ICD-10-CM | POA: Diagnosis not present

## 2018-08-05 DIAGNOSIS — I2510 Atherosclerotic heart disease of native coronary artery without angina pectoris: Secondary | ICD-10-CM | POA: Diagnosis not present

## 2018-08-05 DIAGNOSIS — M24561 Contracture, right knee: Secondary | ICD-10-CM | POA: Diagnosis not present

## 2018-08-05 DIAGNOSIS — I10 Essential (primary) hypertension: Secondary | ICD-10-CM | POA: Diagnosis not present

## 2018-08-06 DIAGNOSIS — M24562 Contracture, left knee: Secondary | ICD-10-CM | POA: Diagnosis not present

## 2018-08-06 DIAGNOSIS — C642 Malignant neoplasm of left kidney, except renal pelvis: Secondary | ICD-10-CM | POA: Diagnosis not present

## 2018-08-06 DIAGNOSIS — I10 Essential (primary) hypertension: Secondary | ICD-10-CM | POA: Diagnosis not present

## 2018-08-06 DIAGNOSIS — R1312 Dysphagia, oropharyngeal phase: Secondary | ICD-10-CM | POA: Diagnosis not present

## 2018-08-06 DIAGNOSIS — M24561 Contracture, right knee: Secondary | ICD-10-CM | POA: Diagnosis not present

## 2018-08-06 DIAGNOSIS — I2510 Atherosclerotic heart disease of native coronary artery without angina pectoris: Secondary | ICD-10-CM | POA: Diagnosis not present

## 2018-08-06 DIAGNOSIS — G309 Alzheimer's disease, unspecified: Secondary | ICD-10-CM | POA: Diagnosis not present

## 2018-08-06 DIAGNOSIS — E785 Hyperlipidemia, unspecified: Secondary | ICD-10-CM | POA: Diagnosis not present

## 2018-08-07 DIAGNOSIS — E785 Hyperlipidemia, unspecified: Secondary | ICD-10-CM | POA: Diagnosis not present

## 2018-08-07 DIAGNOSIS — R1312 Dysphagia, oropharyngeal phase: Secondary | ICD-10-CM | POA: Diagnosis not present

## 2018-08-07 DIAGNOSIS — I10 Essential (primary) hypertension: Secondary | ICD-10-CM | POA: Diagnosis not present

## 2018-08-07 DIAGNOSIS — M24562 Contracture, left knee: Secondary | ICD-10-CM | POA: Diagnosis not present

## 2018-08-07 DIAGNOSIS — G309 Alzheimer's disease, unspecified: Secondary | ICD-10-CM | POA: Diagnosis not present

## 2018-08-07 DIAGNOSIS — C642 Malignant neoplasm of left kidney, except renal pelvis: Secondary | ICD-10-CM | POA: Diagnosis not present

## 2018-08-07 DIAGNOSIS — M24561 Contracture, right knee: Secondary | ICD-10-CM | POA: Diagnosis not present

## 2018-08-07 DIAGNOSIS — I2510 Atherosclerotic heart disease of native coronary artery without angina pectoris: Secondary | ICD-10-CM | POA: Diagnosis not present

## 2018-08-08 DIAGNOSIS — M24562 Contracture, left knee: Secondary | ICD-10-CM | POA: Diagnosis not present

## 2018-08-08 DIAGNOSIS — R1312 Dysphagia, oropharyngeal phase: Secondary | ICD-10-CM | POA: Diagnosis not present

## 2018-08-08 DIAGNOSIS — I10 Essential (primary) hypertension: Secondary | ICD-10-CM | POA: Diagnosis not present

## 2018-08-08 DIAGNOSIS — G309 Alzheimer's disease, unspecified: Secondary | ICD-10-CM | POA: Diagnosis not present

## 2018-08-08 DIAGNOSIS — C642 Malignant neoplasm of left kidney, except renal pelvis: Secondary | ICD-10-CM | POA: Diagnosis not present

## 2018-08-08 DIAGNOSIS — M24561 Contracture, right knee: Secondary | ICD-10-CM | POA: Diagnosis not present

## 2018-08-08 DIAGNOSIS — E785 Hyperlipidemia, unspecified: Secondary | ICD-10-CM | POA: Diagnosis not present

## 2018-08-08 DIAGNOSIS — I2510 Atherosclerotic heart disease of native coronary artery without angina pectoris: Secondary | ICD-10-CM | POA: Diagnosis not present

## 2018-08-11 DIAGNOSIS — I10 Essential (primary) hypertension: Secondary | ICD-10-CM | POA: Diagnosis not present

## 2018-08-11 DIAGNOSIS — I2510 Atherosclerotic heart disease of native coronary artery without angina pectoris: Secondary | ICD-10-CM | POA: Diagnosis not present

## 2018-08-11 DIAGNOSIS — C642 Malignant neoplasm of left kidney, except renal pelvis: Secondary | ICD-10-CM | POA: Diagnosis not present

## 2018-08-11 DIAGNOSIS — G309 Alzheimer's disease, unspecified: Secondary | ICD-10-CM | POA: Diagnosis not present

## 2018-08-11 DIAGNOSIS — M24562 Contracture, left knee: Secondary | ICD-10-CM | POA: Diagnosis not present

## 2018-08-11 DIAGNOSIS — E785 Hyperlipidemia, unspecified: Secondary | ICD-10-CM | POA: Diagnosis not present

## 2018-08-11 DIAGNOSIS — M24561 Contracture, right knee: Secondary | ICD-10-CM | POA: Diagnosis not present

## 2018-08-11 DIAGNOSIS — R1312 Dysphagia, oropharyngeal phase: Secondary | ICD-10-CM | POA: Diagnosis not present

## 2018-08-12 DIAGNOSIS — R1312 Dysphagia, oropharyngeal phase: Secondary | ICD-10-CM | POA: Diagnosis not present

## 2018-08-12 DIAGNOSIS — M24561 Contracture, right knee: Secondary | ICD-10-CM | POA: Diagnosis not present

## 2018-08-12 DIAGNOSIS — I10 Essential (primary) hypertension: Secondary | ICD-10-CM | POA: Diagnosis not present

## 2018-08-12 DIAGNOSIS — C642 Malignant neoplasm of left kidney, except renal pelvis: Secondary | ICD-10-CM | POA: Diagnosis not present

## 2018-08-12 DIAGNOSIS — I2510 Atherosclerotic heart disease of native coronary artery without angina pectoris: Secondary | ICD-10-CM | POA: Diagnosis not present

## 2018-08-12 DIAGNOSIS — E785 Hyperlipidemia, unspecified: Secondary | ICD-10-CM | POA: Diagnosis not present

## 2018-08-12 DIAGNOSIS — M24562 Contracture, left knee: Secondary | ICD-10-CM | POA: Diagnosis not present

## 2018-08-12 DIAGNOSIS — G309 Alzheimer's disease, unspecified: Secondary | ICD-10-CM | POA: Diagnosis not present

## 2018-12-18 ENCOUNTER — Emergency Department: Payer: Medicare Other

## 2018-12-18 ENCOUNTER — Encounter: Payer: Self-pay | Admitting: Emergency Medicine

## 2018-12-18 ENCOUNTER — Inpatient Hospital Stay
Admission: EM | Admit: 2018-12-18 | Discharge: 2018-12-19 | DRG: 204 | Disposition: A | Discharge status: Skilled Nursing Facility | Payer: Medicare Other | Attending: Internal Medicine | Admitting: Internal Medicine

## 2018-12-18 ENCOUNTER — Other Ambulatory Visit: Payer: Self-pay

## 2018-12-18 DIAGNOSIS — I10 Essential (primary) hypertension: Secondary | ICD-10-CM | POA: Diagnosis not present

## 2018-12-18 DIAGNOSIS — F0280 Dementia in other diseases classified elsewhere without behavioral disturbance: Secondary | ICD-10-CM | POA: Diagnosis present

## 2018-12-18 DIAGNOSIS — J9601 Acute respiratory failure with hypoxia: Secondary | ICD-10-CM | POA: Diagnosis not present

## 2018-12-18 DIAGNOSIS — Z20828 Contact with and (suspected) exposure to other viral communicable diseases: Secondary | ICD-10-CM | POA: Diagnosis not present

## 2018-12-18 DIAGNOSIS — G309 Alzheimer's disease, unspecified: Secondary | ICD-10-CM | POA: Diagnosis present

## 2018-12-18 DIAGNOSIS — E785 Hyperlipidemia, unspecified: Secondary | ICD-10-CM | POA: Diagnosis present

## 2018-12-18 DIAGNOSIS — R918 Other nonspecific abnormal finding of lung field: Principal | ICD-10-CM | POA: Diagnosis present

## 2018-12-18 DIAGNOSIS — I2510 Atherosclerotic heart disease of native coronary artery without angina pectoris: Secondary | ICD-10-CM | POA: Diagnosis not present

## 2018-12-18 DIAGNOSIS — Z79899 Other long term (current) drug therapy: Secondary | ICD-10-CM | POA: Diagnosis not present

## 2018-12-18 DIAGNOSIS — Z66 Do not resuscitate: Secondary | ICD-10-CM | POA: Diagnosis present

## 2018-12-18 DIAGNOSIS — Z87891 Personal history of nicotine dependence: Secondary | ICD-10-CM

## 2018-12-18 DIAGNOSIS — F0390 Unspecified dementia without behavioral disturbance: Secondary | ICD-10-CM | POA: Diagnosis not present

## 2018-12-18 DIAGNOSIS — F03C0 Unspecified dementia, severe, without behavioral disturbance, psychotic disturbance, mood disturbance, and anxiety: Secondary | ICD-10-CM

## 2018-12-18 DIAGNOSIS — Z90710 Acquired absence of both cervix and uterus: Secondary | ICD-10-CM | POA: Diagnosis not present

## 2018-12-18 DIAGNOSIS — R0602 Shortness of breath: Secondary | ICD-10-CM | POA: Diagnosis present

## 2018-12-18 DIAGNOSIS — J159 Unspecified bacterial pneumonia: Secondary | ICD-10-CM | POA: Diagnosis not present

## 2018-12-18 DIAGNOSIS — Z7982 Long term (current) use of aspirin: Secondary | ICD-10-CM

## 2018-12-18 DIAGNOSIS — E43 Unspecified severe protein-calorie malnutrition: Secondary | ICD-10-CM | POA: Diagnosis present

## 2018-12-18 DIAGNOSIS — R0902 Hypoxemia: Secondary | ICD-10-CM | POA: Diagnosis present

## 2018-12-18 HISTORY — DX: Atherosclerotic heart disease of native coronary artery without angina pectoris: I25.10

## 2018-12-18 HISTORY — DX: Contracture, left knee: M24.562

## 2018-12-18 HISTORY — DX: Contracture, right knee: M24.561

## 2018-12-18 LAB — COMPREHENSIVE METABOLIC PANEL
ALT: 12 U/L (ref 0–44)
AST: 21 U/L (ref 15–41)
Albumin: 2.8 g/dL — ABNORMAL LOW (ref 3.5–5.0)
Alkaline Phosphatase: 61 U/L (ref 38–126)
Anion gap: 10 (ref 5–15)
BUN: 17 mg/dL (ref 8–23)
CO2: 26 mmol/L (ref 22–32)
Calcium: 8.5 mg/dL — ABNORMAL LOW (ref 8.9–10.3)
Chloride: 105 mmol/L (ref 98–111)
Creatinine, Ser: 0.62 mg/dL (ref 0.44–1.00)
GFR calc Af Amer: >60 mL/min (ref >60)
GFR calc non Af Amer: >60 mL/min (ref >60)
Glucose, Bld: 92 mg/dL (ref 70–99)
Potassium: 4 mmol/L (ref 3.5–5.1)
Sodium: 141 mmol/L (ref 135–145)
Total Bilirubin: 0.3 mg/dL (ref 0.3–1.2)
Total Protein: 6.7 g/dL (ref 6.5–8.1)

## 2018-12-18 LAB — PROCALCITONIN: Procalcitonin: 0.21 ng/mL

## 2018-12-18 LAB — CBC WITH DIFFERENTIAL/PLATELET
Abs Immature Granulocytes: 0.07 10*3/uL (ref 0.00–0.07)
Basophils Absolute: 0 10*3/uL (ref 0.0–0.1)
Basophils Relative: 0 %
Eosinophils Absolute: 0.1 10*3/uL (ref 0.0–0.5)
Eosinophils Relative: 1 %
HCT: 41.1 % (ref 36.0–46.0)
Hemoglobin: 13 g/dL (ref 12.0–15.0)
Immature Granulocytes: 1 %
Lymphocytes Relative: 16 %
Lymphs Abs: 1.2 10*3/uL (ref 0.7–4.0)
MCH: 28.5 pg (ref 26.0–34.0)
MCHC: 31.6 g/dL (ref 30.0–36.0)
MCV: 90.1 fL (ref 80.0–100.0)
Monocytes Absolute: 0.4 10*3/uL (ref 0.1–1.0)
Monocytes Relative: 5 %
Neutro Abs: 5.9 10*3/uL (ref 1.7–7.7)
Neutrophils Relative %: 77 %
Platelets: 234 10*3/uL (ref 150–400)
RBC: 4.56 MIL/uL (ref 3.87–5.11)
RDW: 14.6 % (ref 11.5–15.5)
WBC: 7.7 10*3/uL (ref 4.0–10.5)
nRBC: 0 % (ref 0.0–0.2)

## 2018-12-18 LAB — LACTIC ACID, PLASMA: Lactic Acid, Venous: 2.1 mmol/L (ref 0.5–1.9)

## 2018-12-18 LAB — MRSA PCR SCREENING: MRSA by PCR: NEGATIVE

## 2018-12-18 MED ORDER — SODIUM CHLORIDE 0.9 % IV SOLN
2.0000 g | Freq: Once | INTRAVENOUS | Status: AC
  Administered 2018-12-18: 18:00:00 2 g via INTRAVENOUS
  Filled 2018-12-18: qty 2

## 2018-12-18 MED ORDER — IOHEXOL 300 MG/ML  SOLN
75.0000 mL | Freq: Once | INTRAMUSCULAR | Status: AC | PRN
  Administered 2018-12-18: 19:00:00 60 mL via INTRAVENOUS

## 2018-12-18 MED ORDER — SODIUM CHLORIDE 0.9 % IV BOLUS
500.0000 mL | Freq: Once | INTRAVENOUS | Status: AC
  Administered 2018-12-18: 17:00:00 500 mL via INTRAVENOUS

## 2018-12-18 MED ORDER — VANCOMYCIN HCL IN DEXTROSE 1-5 GM/200ML-% IV SOLN
1000.0000 mg | Freq: Once | INTRAVENOUS | Status: AC
  Administered 2018-12-18: 20:00:00 1000 mg via INTRAVENOUS
  Filled 2018-12-18: qty 200

## 2018-12-18 NOTE — ED Provider Notes (Signed)
[facility] Emergency Department Provider Note  ____________________________________________   First MD Initiated Contact with Patient 12/18/18 1528     (approximate)  I have reviewed the triage vital signs and the nursing notes.   HISTORY  Chief Complaint Shortness of Breath    HPI Tonya Snyder is a 85 y.o. female with history of dementia here with reported hypoxia.  Per report from facility, the patient was noted to have increased work of breathing today.  She was mildly tachycardic.  They took her oxygen and it was in the low 70s.  They were unable to get it to the 80s with nasal cannula.  EMS was called.  On arrival, patient hypoxic but this improved with nonrebreather.  On my assessment, history limited due to severe dementia.  She denies any overt complaints.  Level 5 caveat invoked as remainder of history, ROS, and physical exam limited due to patient's dementia.         Past Medical History:  Diagnosis Date   Contracture of knee, left    Contracture of knee, right    Coronary artery disease    Dementia ([place])    Hyperlipidemia    Hypertension     Patient Active Problem List   Diagnosis Date Noted   Community acquired bacterial pneumonia 12/19/2018   Acute encephalopathy 05/10/2016   Protein-calorie malnutrition, severe ([place]) 07/09/2014   Pneumonia 07/07/2014   Fall 07/07/2014   Hypokalemia 07/07/2014   Lipoma of arm 10/14/2013    Past Surgical History:  Procedure Laterality Date   ABDOMINAL HYSTERECTOMY     HIP SURGERY      Prior to Admission medications   Medication Sig Start Date End Date Taking? Authorizing Provider  acetaminophen (TYLENOL) 325 MG tablet Take 325 mg by mouth every 4 (four) hours as needed for mild pain.    Yes [provider]  amLODipine (NORVASC) 5 MG tablet Take 5 mg by mouth daily.    Yes [provider]  ammonium lactate (LAC-HYDRIN) 12 % lotion Apply 1 application  topically 2 (two) times daily. (apply to feet)   Yes [provider]  aspirin EC 81 MG tablet Take 81 mg by mouth daily.   Yes [provider]  ciclopirox (LOPROX) 0.77 % SUSP Apply 1 application topically daily. (apply to toenails)   Yes [provider]  docusate sodium (COLACE) 100 MG capsule Take 100 mg by mouth 2 (two) times daily.    Yes [provider]  metoprolol succinate (TOPROL-XL) 25 MG 24 hr tablet Take 25 mg by mouth daily.   Yes [provider]  Multiple Vitamins-Minerals (MULTIVITAMIN PO) Take 1 tablet by mouth daily. 07/27/13  Yes [provider]  polyethylene glycol (MIRALAX / GLYCOLAX) 17 g packet Take 17 g by mouth daily. (hold for more than 1 BM daily)   Yes [provider]  simvastatin (ZOCOR) 20 MG tablet Take 20 mg by mouth at bedtime.    Yes [provider]    Allergies Patient has no known allergies.  Family History  Problem Relation Age of Onset   Bladder Cancer Neg Hx    Kidney cancer Neg Hx     Social History Social History   Tobacco Use   Smoking status: Former Smoker   Smokeless tobacco: Current User    Types: Snuff  Substance Use Topics   Alcohol use: No    Alcohol/week: 0.0 standard drinks   Drug use: No    Review  of Systems  Review of Systems  Unable to perform ROS: Dementia     ____________________________________________  PHYSICAL EXAM:      VITAL SIGNS: ED Triage Vitals  Enc Vitals Group     BP 12/18/18 1529 (!) 148/65     Pulse Rate 12/18/18 1529 75     Resp 12/18/18 1529 19     Temp 12/18/18 1529 98.1 F (36.7 C)     Temp Source 12/18/18 1529 Axillary     SpO2 12/18/18 1529 97 %     Weight 12/18/18 1530 90 lb (40.8 kg)     Height --      Head Circumference --      [place] --      Pain Score --      Pain Loc --      Pain Edu? --      Excl. in [place]? --      Physical Exam Vitals signs and nursing note reviewed.  Constitutional:      General:  She is not in acute distress.    Appearance: She is well-developed.  HENT:     Head: Normocephalic and atraumatic.  Eyes:     Conjunctiva/sclera: Conjunctivae normal.  Neck:     Musculoskeletal: Neck supple.  Cardiovascular:     Rate and Rhythm: Regular rhythm. Tachycardia present.     Heart sounds: Normal heart sounds. No murmur. No friction rub.  Pulmonary:     Effort: Pulmonary effort is normal. No respiratory distress.     Breath sounds: Examination of the left-middle field reveals rales. Examination of the left-lower field reveals rales. Decreased breath sounds and rales present. No wheezing.  Abdominal:     General: There is no distension.     Palpations: Abdomen is soft.     Tenderness: There is no abdominal tenderness.  Skin:    General: Skin is warm.     Capillary Refill: Capillary refill takes less than 2 seconds.  Neurological:     Mental Status: She is alert and oriented to person, place, and time.     Motor: No abnormal muscle tone.       ____________________________________________   LABS (all labs ordered are listed, but only abnormal results are displayed)  Labs Reviewed  COMPREHENSIVE METABOLIC PANEL - Abnormal; Notable for the following components:      Result Value   Calcium 8.5 (*)    Albumin 2.8 (*)    All other components within normal limits  LACTIC ACID, PLASMA - Abnormal; Notable for the following components:   Lactic Acid, Venous 2.1 (*)    All other components within normal limits  MRSA PCR SCREENING  CULTURE, BLOOD (ROUTINE X 2)  CULTURE, BLOOD (ROUTINE X 2)  SARS CORONAVIRUS 2 (TAT 6-24 HRS)  CBC WITH DIFFERENTIAL/PLATELET  PROCALCITONIN  LACTIC ACID, PLASMA  BASIC METABOLIC PANEL  CBC    ____________________________________________  EKG: Normal sinus rhythm, ventricular rate 83.  QRS 102, QTc 490.  No acute ST or T-segment changes.  Baseline wander. ________________________________________  RADIOLOGY All imaging, including plain  films, CT scans, and ultrasounds, independently reviewed by me, and interpretations confirmed via formal radiology reads.  ED MD interpretation:   CXR: LLL mass/consolidation CT Chest: Possible LLL CA, PNA  Official radiology report(s): Ct Chest W Contrast  Result Date: 12/18/2018 CLINICAL DATA:  Low O2 sats. EXAM: CT CHEST WITH CONTRAST TECHNIQUE: Multidetector CT imaging of the chest was performed during intravenous contrast administration. CONTRAST:  2mL OMNIPAQUE IOHEXOL  300 MG/ML  SOLN COMPARISON:  None. FINDINGS: Cardiovascular: Diffuse aortic atherosclerosis and coronary artery disease. Heart is normal size. No evidence of aortic aneurysm. Mediastinum/Nodes: Emphysema. Borderline size mediastinal and left hilar lymph nodes. Left hilar node has a short axis diameter of 8 mm. Subcarinal lymph node has a short axis diameter of 12 mm. AP window lymph node has a short axis diameter of 10 mm. Trachea and esophagus are unremarkable. Numerous nodules throughout the right thyroid lobe, likely multinodular goiter. Lungs/Pleura: Rounded masslike area noted in the left lower lobe measuring 5.8 x 3.7 cm. While this could reflect rounded pneumonia, lung mass/malignancy is a primary concern. No effusions. No confluent opacity on the right. Upper Abdomen: Imaging into the upper abdomen shows no acute findings. Musculoskeletal: Chest wall soft tissues are unremarkable. No acute bony abnormality. Chronic appearing compression fracture, moderate at T9. IMPRESSION: Emphysema. 5.8 cm rounded mass in the left lower lobe concerning for possible lung cancer. Rounded pneumonia is possible but felt less likely. If a trial of antibiotic therapy is given, this could be followed with repeat CT at that time. Borderline sized left hilar and mediastinal lymph nodes. Coronary artery disease Aortic Atherosclerosis (ICD10-I70.0). Electronically Signed   By: Rolm Baptise M.D.   On: 12/18/2018 19:45   Dg Chest Portable 1  View  Result Date: 12/18/2018 CLINICAL DATA:  Patient presents to the ED from [facility] for oxygen saturation of 85% on room air earlier today. Patient was placed on 6L/[place] by EMS and oxygen saturation increased to 95%. Patient has alzheimer's at baseline and is nonverbal. EXAM: PORTABLE CHEST 1 VIEW COMPARISON:  12/11/2015 FINDINGS: Heart size is normal. There is significant opacity at the LEFT lung base suspicious for mass or consolidation. LEFT pleural effusion. There is dense atherosclerotic calcification of the thoracic aorta. RIGHT lung is clear. IMPRESSION: 1. LEFT lower lobe mass or consolidation. Consider further evaluation is CT of the chest. Intravenous contrast is recommended unless contraindicated. 2. Small LEFT pleural effusion. Electronically Signed   By: Nolon Nations M.D.   On: 12/18/2018 15:50    ____________________________________________  PROCEDURES   Procedure(s) performed (including Critical Care):  Procedures  ____________________________________________  INITIAL IMPRESSION / MDM / [place] / ED COURSE  As part of my medical decision making, I reviewed the following data within the electronic MEDICAL RECORD NUMBER Notes from prior ED visits and [place] [employer] [place] was evaluated in Emergency Department on 12/19/2018 for the symptoms described in the history of present illness. She was evaluated in the context of the global COVID-19 pandemic, which necessitated consideration that the patient might be at risk for infection with the SARS-CoV-2 virus that causes COVID-19. Institutional protocols and algorithms that pertain to the evaluation of patients at risk for COVID-19 are in a state of rapid change based on information released by regulatory bodies including the CDC and federal and state organizations. These policies and algorithms were followed during the patient's care in the ED.  Some ED evaluations and interventions may be  delayed as a result of limited staffing during the pandemic.*      Medical Decision Making:  85 year old female who with acute hypoxic respiratory failure, likely secondary to pneumonia of the left lower lobe, though new cancer with postobstructive process is also a consideration.  No leg swelling or symptoms to suggest PE.  Patient started on empiric antibiotics and will admit.  Her son was notified by myself, is  aware of possible mass, and is in agreement with plan.  ____________________________________________  FINAL CLINICAL IMPRESSION(S) / ED DIAGNOSES  Final diagnoses:  Acute respiratory failure with hypoxemia (HCC)  Lung mass     MEDICATIONS GIVEN DURING THIS VISIT:  Medications  enoxaparin (LOVENOX) injection 30 mg (has no administration in time range)  sodium chloride 0.9 % bolus 500 mL (0 mLs Intravenous Stopped 12/18/18 1937)  ceFEPIme (MAXIPIME) 2 g in sodium chloride 0.9 % 100 mL IVPB (0 g Intravenous Stopped 12/18/18 1930)  vancomycin (VANCOCIN) IVPB 1000 mg/200 mL premix (0 mg Intravenous Stopped 12/18/18 2049)  iohexol (OMNIPAQUE) 300 MG/ML solution 75 mL (60 mLs Intravenous Contrast Given 12/18/18 1905)     ED Discharge Orders    None       Note:  This document was prepared using Dragon voice recognition software and may include unintentional dictation errors.   Duffy Bruce, MD 12/19/18 [phone]

## 2018-12-18 NOTE — ED Triage Notes (Signed)
Patient presents to the ED from [facility] for oxygen saturation of 85% on room air earlier today.  Patient was placed on 6L/[place] by EMS and oxygen saturation increased to 95%.  Patient has alzheimer's at baseline and is nonverbal.  Patient has contractures to arms and legs.  Per staff patient's behavior has not changed dramatically.  Patient is in no obvious distress at this time.

## 2018-12-18 NOTE — ED Notes (Signed)
Only 1 set of blood cultures obtained due to time constraints,  patient having dementia and being very contracted and starting IVs and drawing blood is very difficult.

## 2018-12-18 NOTE — Consult Note (Signed)
PHARMACY -  BRIEF ANTIBIOTIC NOTE   Pharmacy has received consult(s) for cefepime and vancomycin from an ED provider.  The patient's profile has been reviewed for ht/wt/allergies/indication/available labs.  NKDA  Patient is a 85 y/o F who presents to [facility] ED from [facility] with hypoxia.   One time order(s) placed for  -vancomycin 1 g -cefepime 2 g  Further antibiotics/pharmacy consults should be ordered by admitting physician if indicated.                       Thank you, Benita Gutter 12/18/2018  4:32 PM

## 2018-12-18 NOTE — ED Notes (Signed)
O2 decreased from 4L via [place] to 2L at this time to assess pt's ability to maintain O2 sats at a lower O2 concentration; pt's sats have been at 100% for the last 1hr while on 4L.

## 2018-12-19 DIAGNOSIS — R0602 Shortness of breath: Secondary | ICD-10-CM | POA: Diagnosis present

## 2018-12-19 DIAGNOSIS — F03C0 Unspecified dementia, severe, without behavioral disturbance, psychotic disturbance, mood disturbance, and anxiety: Secondary | ICD-10-CM

## 2018-12-19 DIAGNOSIS — J9601 Acute respiratory failure with hypoxia: Secondary | ICD-10-CM

## 2018-12-19 DIAGNOSIS — Z90710 Acquired absence of both cervix and uterus: Secondary | ICD-10-CM | POA: Diagnosis not present

## 2018-12-19 DIAGNOSIS — F0280 Dementia in other diseases classified elsewhere without behavioral disturbance: Secondary | ICD-10-CM | POA: Diagnosis not present

## 2018-12-19 DIAGNOSIS — I2510 Atherosclerotic heart disease of native coronary artery without angina pectoris: Secondary | ICD-10-CM | POA: Diagnosis not present

## 2018-12-19 DIAGNOSIS — Z20828 Contact with and (suspected) exposure to other viral communicable diseases: Secondary | ICD-10-CM | POA: Diagnosis not present

## 2018-12-19 DIAGNOSIS — E785 Hyperlipidemia, unspecified: Secondary | ICD-10-CM | POA: Diagnosis not present

## 2018-12-19 DIAGNOSIS — Z87891 Personal history of nicotine dependence: Secondary | ICD-10-CM | POA: Diagnosis not present

## 2018-12-19 DIAGNOSIS — I10 Essential (primary) hypertension: Secondary | ICD-10-CM | POA: Diagnosis not present

## 2018-12-19 DIAGNOSIS — J159 Unspecified bacterial pneumonia: Secondary | ICD-10-CM | POA: Diagnosis present

## 2018-12-19 DIAGNOSIS — F0390 Unspecified dementia without behavioral disturbance: Secondary | ICD-10-CM

## 2018-12-19 DIAGNOSIS — Z79899 Other long term (current) drug therapy: Secondary | ICD-10-CM | POA: Diagnosis not present

## 2018-12-19 DIAGNOSIS — R918 Other nonspecific abnormal finding of lung field: Principal | ICD-10-CM

## 2018-12-19 DIAGNOSIS — Z7982 Long term (current) use of aspirin: Secondary | ICD-10-CM | POA: Diagnosis not present

## 2018-12-19 DIAGNOSIS — G309 Alzheimer's disease, unspecified: Secondary | ICD-10-CM | POA: Diagnosis not present

## 2018-12-19 DIAGNOSIS — R0902 Hypoxemia: Secondary | ICD-10-CM | POA: Diagnosis present

## 2018-12-19 DIAGNOSIS — Z66 Do not resuscitate: Secondary | ICD-10-CM | POA: Diagnosis not present

## 2018-12-19 DIAGNOSIS — E43 Unspecified severe protein-calorie malnutrition: Secondary | ICD-10-CM

## 2018-12-19 LAB — BASIC METABOLIC PANEL
Anion gap: 8 (ref 5–15)
BUN: 11 mg/dL (ref 8–23)
CO2: 25 mmol/L (ref 22–32)
Calcium: 8.2 mg/dL — ABNORMAL LOW (ref 8.9–10.3)
Chloride: 107 mmol/L (ref 98–111)
Creatinine, Ser: 0.48 mg/dL (ref 0.44–1.00)
GFR calc Af Amer: >60 mL/min (ref >60)
GFR calc non Af Amer: >60 mL/min (ref >60)
Glucose, Bld: 90 mg/dL (ref 70–99)
Potassium: 3.3 mmol/L — ABNORMAL LOW (ref 3.5–5.1)
Sodium: 140 mmol/L (ref 135–145)

## 2018-12-19 LAB — CBC
HCT: 35.5 % — ABNORMAL LOW (ref 36.0–46.0)
Hemoglobin: 11.2 g/dL — ABNORMAL LOW (ref 12.0–15.0)
MCH: 28 pg (ref 26.0–34.0)
MCHC: 31.5 g/dL (ref 30.0–36.0)
MCV: 88.8 fL (ref 80.0–100.0)
Platelets: 146 10*3/uL — ABNORMAL LOW (ref 150–400)
RBC: 4 MIL/uL (ref 3.87–5.11)
RDW: 14.5 % (ref 11.5–15.5)
WBC: 6.3 10*3/uL (ref 4.0–10.5)
nRBC: 0 % (ref 0.0–0.2)

## 2018-12-19 LAB — SARS CORONAVIRUS 2 (TAT 6-24 HRS): SARS Coronavirus 2: NEGATIVE

## 2018-12-19 MED ORDER — SIMVASTATIN 10 MG PO TABS: 20.0000 mg | ORAL_TABLET | Freq: Every day | ORAL | Status: DC

## 2018-12-19 MED ORDER — ASPIRIN EC 81 MG PO TBEC
81.0000 mg | DELAYED_RELEASE_TABLET | Freq: Every day | ORAL | Status: DC
  Administered 2018-12-19: 11:00:00 81 mg via ORAL
  Filled 2018-12-19: qty 1

## 2018-12-19 MED ORDER — DOCUSATE SODIUM 100 MG PO CAPS
100.0000 mg | ORAL_CAPSULE | Freq: Two times a day (BID) | ORAL | Status: DC
  Administered 2018-12-19: 12:00:00 100 mg via ORAL
  Filled 2018-12-19: qty 1

## 2018-12-19 MED ORDER — POLYETHYLENE GLYCOL 3350 17 G PO PACK
17.0000 g | PACK | Freq: Every day | ORAL | Status: DC
  Filled 2018-12-19: qty 1

## 2018-12-19 MED ORDER — ENOXAPARIN SODIUM 30 MG/0.3ML ~~LOC~~ SOLN
30.0000 mg | SUBCUTANEOUS | Status: DC
  Administered 2018-12-19: 03:00:00 30 mg via SUBCUTANEOUS
  Filled 2018-12-19: qty 0.3

## 2018-12-19 MED ORDER — AMLODIPINE BESYLATE 5 MG PO TABS
5.0000 mg | ORAL_TABLET | Freq: Every day | ORAL | Status: DC
  Administered 2018-12-19: 12:00:00 5 mg via ORAL
  Filled 2018-12-19: qty 1

## 2018-12-19 MED ORDER — METOPROLOL SUCCINATE ER 50 MG PO TB24
25.0000 mg | ORAL_TABLET | Freq: Every day | ORAL | Status: DC
  Administered 2018-12-19: 12:00:00 25 mg via ORAL
  Filled 2018-12-19: qty 1

## 2018-12-19 NOTE — Discharge Summary (Signed)
Physician Discharge Summary  Tonya Snyder [id number] DOB: May 23, 1920 DOA: 12/18/2018  PCP: Tonya Guise, MD  Admit date: 12/18/2018 Discharge date: 12/19/2018  Recommendations for Outpatient Follow-up:  1. Follow up with PCP in 7-10 days 2. Please note: Pulse oximeters very easily report a much lower oxygen saturation on this patient than is reality.   Discharge Diagnoses: Principal diagnosis is #1 1. Perceived hypoxia. 2. Known Left lower lobe lung mass. Family does not wish to pursue.  Discharge Condition: Fair  Disposition: Long term care facility  Diet recommendation: Regular  Filed Weights   12/18/18 1530  Weight: 40.8 kg    History of present illness:  Tonya Snyder is a 85 y.o. female with medical history significant of advanced dementia nonverbal at baseline, hypertension who presented from her facility with concerns of hypoxia after she was noted to have increase work of breathing.  Patient was found to have oxygen of 85% on room air and was placed on 6 L via nasal cannula by EMS and was able to wean off to 2L.  Reportedly from ED triage note, staff at the facility has not noticed any drastic changes to her behavior.  ED Course: She was afebrile and intermittently hypertensive up to 180s over 90s on 2 L via nasal cannula.  No leukocytosis or anemia.  CMP had normal creatinine and otherwise unremarkable.  Lactate of 2.1.  Procalcitonin of 0.21. CT chest showed 5.8 cm rounded mass in the left lower lobe concerning for lung cancer or rounded pneumonia.  Recommend follow-up CT if a trial of antibiotic therapy is given. COVID test is negative.Marland Kitchen  Hospital Course:  The patient was roomed in the ED awaiting a bed on the floor. Nursing found that the pulse oximeters were not picking up heart rhythm with respiratory wave forms. When these two rhythms could be made to coincide by manipulating the pulse oximeter placed on her forehead, the patient was found to be saturating 100% on  room air. CT of the chest was obtained. It demonstrated a known rounded density in the left lower lobe. The patient was afebrile, did not have rales on pulmonary exam, and did not have a leukocytosis. Procalcitonin was checked and was negative. I have discussed the patient with her son, Tonya Snyder. He did not wish to have lung mass pursued. I have also made him aware of the situation with the pulse oximeter. He is in agreement with plans to return the patient to her long term care facility.  Today's assessment: S: The patient is resting comfortably. No new complaints. O: Vitals:  Vitals:   12/19/18 0625 12/19/18 0850  BP: (!) 158/80   Pulse: 90   Resp: 16 16  Temp:    SpO2: 100% 100%   Exam:  Constitutional:   The patient is awake and alert. No acute distress. She is elderly, weak, and frail.   Respiratory:   No increased work of breathing.  No wheezes, rales, or rhonchi  No tactile fremitus Cardiovascular:   Regular rate and rhythm  No murmurs, ectopy, or gallups.  No lateral PMI. No thrills. Abdomen:   Abdomen is soft, non-tender, non-distended  No hernias, masses, or organomegaly  Normoactive bowel sounds.  Musculoskeletal:   No cyanosis, clubbing, or edema Skin:   No rashes, lesions, ulcers  palpation of skin: no induration or nodules Neurologic:   CN 2-12 intact. The patient is moving all extremities. Psychiatric:   Mood and affect are congruent  Discharge Instructions  Discharge  Instructions    Activity as tolerated - No restrictions   Complete by: As directed    Call MD for:  difficulty breathing, headache or visual disturbances   Complete by: As directed    Call MD for:  persistant nausea and vomiting   Complete by: As directed    Call MD for:  temperature >100.4   Complete by: As directed    Diet general   Complete by: As directed    Discharge instructions   Complete by: As directed    Follow up with PCP in 7-10 days. Please note: pulse  oximeters have a very difficult reading this patient's oxygen saturations.   Increase activity slowly   Complete by: As directed      Allergies as of 12/19/2018   No Known Allergies     Medication List    TAKE these medications   acetaminophen 325 MG tablet Commonly known as: TYLENOL Take 325 mg by mouth every 4 (four) hours as needed for mild pain.   amLODipine 5 MG tablet Commonly known as: NORVASC Take 5 mg by mouth daily.   ammonium lactate 12 % lotion Commonly known as: LAC-HYDRIN Apply 1 application topically 2 (two) times daily. (apply to feet)   aspirin EC 81 MG tablet Take 81 mg by mouth daily.   ciclopirox 0.77 % Susp Commonly known as: LOPROX Apply 1 application topically daily. (apply to toenails)   docusate sodium 100 MG capsule Commonly known as: COLACE Take 100 mg by mouth 2 (two) times daily.   metoprolol succinate 25 MG 24 hr tablet Commonly known as: TOPROL-XL Take 25 mg by mouth daily.   MULTIVITAMIN PO Take 1 tablet by mouth daily.   polyethylene glycol 17 g packet Commonly known as: MIRALAX / GLYCOLAX Take 17 g by mouth daily. (hold for more than 1 BM daily)   simvastatin 20 MG tablet Commonly known as: ZOCOR Take 20 mg by mouth at bedtime.      No Known Allergies  The results of significant diagnostics from this hospitalization (including imaging, microbiology, ancillary and laboratory) are listed below for reference.    Significant Diagnostic Studies: Ct Chest W Contrast  Result Date: 12/18/2018 CLINICAL DATA:  Low O2 sats. EXAM: CT CHEST WITH CONTRAST TECHNIQUE: Multidetector CT imaging of the chest was performed during intravenous contrast administration. CONTRAST:  24mL OMNIPAQUE IOHEXOL 300 MG/ML  SOLN COMPARISON:  None. FINDINGS: Cardiovascular: Diffuse aortic atherosclerosis and coronary artery disease. Heart is normal size. No evidence of aortic aneurysm. Mediastinum/Nodes: Emphysema. Borderline size mediastinal and left hilar  lymph nodes. Left hilar node has a short axis diameter of 8 mm. Subcarinal lymph node has a short axis diameter of 12 mm. AP window lymph node has a short axis diameter of 10 mm. Trachea and esophagus are unremarkable. Numerous nodules throughout the right thyroid lobe, likely multinodular goiter. Lungs/Pleura: Rounded masslike area noted in the left lower lobe measuring 5.8 x 3.7 cm. While this could reflect rounded pneumonia, lung mass/malignancy is a primary concern. No effusions. No confluent opacity on the right. Upper Abdomen: Imaging into the upper abdomen shows no acute findings. Musculoskeletal: Chest wall soft tissues are unremarkable. No acute bony abnormality. Chronic appearing compression fracture, moderate at T9. IMPRESSION: Emphysema. 5.8 cm rounded mass in the left lower lobe concerning for possible lung cancer. Rounded pneumonia is possible but felt less likely. If a trial of antibiotic therapy is given, this could be followed with repeat CT at that time. Borderline sized left  hilar and mediastinal lymph nodes. Coronary artery disease Aortic Atherosclerosis (ICD10-I70.0). Electronically Signed   By: Rolm Baptise M.D.   On: 12/18/2018 19:45   Dg Chest Portable 1 View  Result Date: 12/18/2018 CLINICAL DATA:  Patient presents to the ED from [facility] for oxygen saturation of 85% on room air earlier today. Patient was placed on 6L/[place] by EMS and oxygen saturation increased to 95%. Patient has alzheimer's at baseline and is nonverbal. EXAM: PORTABLE CHEST 1 VIEW COMPARISON:  12/11/2015 FINDINGS: Heart size is normal. There is significant opacity at the LEFT lung base suspicious for mass or consolidation. LEFT pleural effusion. There is dense atherosclerotic calcification of the thoracic aorta. RIGHT lung is clear. IMPRESSION: 1. LEFT lower lobe mass or consolidation. Consider further evaluation is CT of the chest. Intravenous contrast is recommended unless contraindicated. 2. Small LEFT pleural  effusion. Electronically Signed   By: Nolon Nations M.D.   On: 12/18/2018 15:50    Microbiology: Recent Results (from the past 240 hour(s))  MRSA PCR Screening     Status: None   Collection Time: 12/18/18  4:57 PM   Specimen: Nasopharyngeal  Result Value Ref Range Status   MRSA by PCR NEGATIVE NEGATIVE Final    Comment:        The GeneXpert MRSA Assay (FDA approved for NASAL specimens only), is one component of a comprehensive MRSA colonization surveillance program. It is not intended to diagnose MRSA infection nor to guide or monitor treatment for MRSA infections. Performed at [facility], [place], [place] [address]   SARS CORONAVIRUS 2 (TAT 6-24 HRS) Nasopharyngeal Nasopharyngeal Swab     Status: None   Collection Time: 12/18/18  7:42 PM   Specimen: Nasopharyngeal Swab  Result Value Ref Range Status   SARS Coronavirus 2 NEGATIVE NEGATIVE Final    Comment: (NOTE) SARS-CoV-2 target nucleic acids are NOT DETECTED. The SARS-CoV-2 RNA is generally detectable in upper and lower respiratory specimens during the acute phase of infection. Negative results do not preclude SARS-CoV-2 infection, do not rule out co-infections with other pathogens, and should not be used as the sole basis for treatment or other patient management decisions. Negative results must be combined with clinical observations, patient history, and epidemiological information. The expected result is Negative. Fact Sheet for Patients: SugarRoll.be Fact Sheet for Healthcare Providers: https://www.woods-mathews.com/ This test is not yet approved or cleared by the [place] FDA and  has been authorized for detection and/or diagnosis of SARS-CoV-2 by FDA under an Emergency Use Authorization (EUA). This EUA will remain  in effect (meaning this test can be used) for the duration of the COVID-19 declaration under Section 56 4(b)(1) of the Act,  21 U.S.C. section 360bbb-3(b)(1), unless the authorization is terminated or revoked sooner. Performed at [facility], [place] [address], [place],  [address]      Labs: Basic Metabolic Panel: Recent Labs  Lab 12/18/18 1656 12/19/18 0623  NA 141 140  K 4.0 3.3*  CL 105 107  CO2 26 25  GLUCOSE 92 90  BUN 17 11  CREATININE 0.62 0.48  CALCIUM 8.5* 8.2*   Liver Function Tests: Recent Labs  Lab 12/18/18 1656  AST 21  ALT 12  ALKPHOS 61  BILITOT 0.3  PROT 6.7  ALBUMIN 2.8*   No results for input(s): LIPASE, AMYLASE in the last 168 hours. No results for input(s): AMMONIA in the last 168 hours. CBC: Recent Labs  Lab 12/18/18 1656 12/19/18 [id number]  WBC 7.7 6.3  NEUTROABS 5.9  --   HGB 13.0 11.2*  HCT 41.1 35.5*  MCV 90.1 88.8  PLT 234 146*   Cardiac Enzymes: No results for input(s): CKTOTAL, CKMB, CKMBINDEX, TROPONINI in the last 168 hours. BNP: BNP (last 3 results) No results for input(s): BNP in the last 8760 hours.  ProBNP (last 3 results) No results for input(s): PROBNP in the last 8760 hours.  CBG: No results for input(s): GLUCAP in the last 168 hours.  Principal Problem:   Community acquired bacterial pneumonia Active Problems:   Protein-calorie malnutrition, severe ([place])   Essential hypertension   Advanced dementia ([place])  There is no evidence for community acquired pneumonia. There is a left lower lobe lung mass of which the family is aware. They do not wish for this to be followed up.  Time coordinating discharge: 38 minutes.  Signed:        Annick Dimaio, DO [facility]  12/19/2018, 10:27 AM

## 2018-12-19 NOTE — ED Notes (Signed)
Attempted to call report to [facility] but could not continue to hold

## 2018-12-19 NOTE — ED Notes (Signed)
Pt soiled brief changed and pt given a new clean brief

## 2018-12-19 NOTE — H&P (Signed)
History and Physical    Tonya Snyder [id number] DOB: 17-Oct-1920 DOA: 12/18/2018  PCP: Lavera Guise, MD  Patient coming from: [facility]  I have personally briefly reviewed patient's old medical records in [place]  Chief Complaint: hypoxic  HPI: Tonya Snyder is a 85 y.o. female with medical history significant of advanced dementia nonverbal at baseline, hypertension who presented from her facility with concerns of hypoxia after she was noted to have increase work of breathing.  Patient was found to have oxygen of 85% on room air and was placed on 6 L via nasal cannula by EMS and was able to wean off to 2L.  Reportedly from ED triage note, staff at the facility has not noticed any drastic changes to her behavior.  ED Course: She was afebrile and intermittently hypertensive up to 180s over 90s on 2 L via nasal cannula.  No leukocytosis or anemia.  CMP had normal creatinine and otherwise unremarkable.  Lactate of 2.1.  Procalcitonin of 0.21. CT chest showed 5.8 cm rounded mass in the left lower lobe concerning for lung cancer or rounded pneumonia.  Recommend follow-up CT if a trial of antibiotic therapy is given. COVID test is pending.  Review of Systems:  Unable to obtain given patient's advanced dementia and is nonverbal at baseline.  Past Medical History:  Diagnosis Date  . Contracture of knee, left   . Contracture of knee, right   . Coronary artery disease   . Dementia ([place])   . Hyperlipidemia   . Hypertension     Past Surgical History:  Procedure Laterality Date  . ABDOMINAL HYSTERECTOMY    . HIP SURGERY       reports that she has quit smoking. Her smokeless tobacco use includes snuff. She reports that she does not drink alcohol or use drugs.  No Known Allergies  Family History  Problem Relation Age of Onset  . Bladder Cancer Neg Hx   . Kidney cancer Neg Hx    Family history reviewed and not pertinent   Prior to Admission medications   Medication  Sig Start Date End Date Taking? Authorizing Provider  acetaminophen (TYLENOL) 325 MG tablet Take 325 mg by mouth every 4 (four) hours as needed for mild pain.    Yes [provider]  amLODipine (NORVASC) 5 MG tablet Take 5 mg by mouth daily.    Yes [provider]  ammonium lactate (LAC-HYDRIN) 12 % lotion Apply 1 application topically 2 (two) times daily. (apply to feet)   Yes [provider]  aspirin EC 81 MG tablet Take 81 mg by mouth daily.   Yes [provider]  ciclopirox (LOPROX) 0.77 % SUSP Apply 1 application topically daily. (apply to toenails)   Yes [provider]  docusate sodium (COLACE) 100 MG capsule Take 100 mg by mouth 2 (two) times daily.    Yes [provider]  metoprolol succinate (TOPROL-XL) 25 MG 24 hr tablet Take 25 mg by mouth daily.   Yes [provider]  Multiple Vitamins-Minerals (MULTIVITAMIN PO) Take 1 tablet by mouth daily. 07/27/13  Yes [provider]  polyethylene glycol (MIRALAX / GLYCOLAX) 17 g packet Take 17 g by mouth daily. (hold for more than 1 BM daily)   Yes [provider]  simvastatin (ZOCOR) 20 MG tablet Take 20 mg by mouth at bedtime.    Yes [provider]    Physical Exam: Vitals:   12/18/18 2128 12/18/18 2245 12/19/18 0015  12/19/18 0104  BP: (!) 169/79 110/81 (!) 153/77 (!) 162/71  Pulse: 91 86 91 94  Resp: 17 16 16 16   Temp:      TempSrc:      SpO2: 100% 100% 100% 100%  Weight:      Height:   5' 2.99" (1.6 m)     Constitutional: NAD, calm, comfortable, well-appearing cachetic elderly female lying flat in bed Vitals:   12/18/18 2128 12/18/18 2245 12/19/18 0015 12/19/18 0104  BP: (!) 169/79 110/81 (!) 153/77 (!) 162/71  Pulse: 91 86 91 94  Resp: 17 16 16 16   Temp:      TempSrc:      SpO2: 100% 100% 100% 100%  Weight:      Height:   5' 2.99" (1.6 m)    Eyes: PERRL, lids and conjunctivae normal ENMT: Mucous membranes are moist. Posterior pharynx  clear of any exudate or lesions. Neck: normal, supple, no masses Respiratory: clear to auscultation bilaterally, no wheezing, no crackles. Normal respiratory effort on 2 L via nasal cannula with oxygen saturation of 96%. No accessory muscle use.  Cardiovascular: Regular rate and rhythm, no murmurs / rubs / gallops. No extremity edema. 2+ pedal pulses.  Abdomen: no tenderness, no masses palpated. Bowel sounds positive.  Musculoskeletal: no clubbing / cyanosis. No joint deformity upper and lower extremities. Good ROM, no contractures. Normal muscle tone.  Skin: no rashes, lesions, ulcers. No induration Neurologic: Unable to assess due to advanced dementia but appears grossly neurologically intact. Psychiatric: Unable to assess due to severe advanced dementia.    Labs on Admission: I have personally reviewed following labs and imaging studies  CBC: Recent Labs  Lab 12/18/18 1656  WBC 7.7  NEUTROABS 5.9  HGB 13.0  HCT 41.1  MCV 90.1  PLT [id number]   Basic Metabolic Panel: Recent Labs  Lab 12/18/18 1656  NA 141  K 4.0  CL 105  CO2 26  GLUCOSE 92  BUN 17  CREATININE 0.62  CALCIUM 8.5*   GFR: Estimated Creatinine Clearance: 26.5 mL/min (by C-G formula based on SCr of 0.62 mg/dL). Liver Function Tests: Recent Labs  Lab 12/18/18 1656  AST 21  ALT 12  ALKPHOS 61  BILITOT 0.3  PROT 6.7  ALBUMIN 2.8*   No results for input(s): LIPASE, AMYLASE in the last 168 hours. No results for input(s): AMMONIA in the last 168 hours. Coagulation Profile: No results for input(s): INR, PROTIME in the last 168 hours. Cardiac Enzymes: No results for input(s): CKTOTAL, CKMB, CKMBINDEX, TROPONINI in the last 168 hours. BNP (last 3 results) No results for input(s): PROBNP in the last 8760 hours. HbA1C: No results for input(s): HGBA1C in the last 72 hours. CBG: No results for input(s): GLUCAP in the last 168 hours. Lipid Profile: No results for input(s): CHOL, HDL, LDLCALC, TRIG, CHOLHDL,  LDLDIRECT in the last 72 hours. Thyroid Function Tests: No results for input(s): TSH, T4TOTAL, FREET4, T3FREE, THYROIDAB in the last 72 hours. Anemia Panel: No results for input(s): VITAMINB12, FOLATE, FERRITIN, TIBC, IRON, RETICCTPCT in the last 72 hours. Urine analysis:    Component Value Date/Time   COLORURINE AMBER (A) 05/10/2016 1341   APPEARANCEUR HAZY (A) 05/10/2016 1341   APPEARANCEUR Clear 06/20/2015 1409   LABSPEC 1.028 05/10/2016 1341   PHURINE 7.0 05/10/2016 1341   GLUCOSEU NEGATIVE 05/10/2016 1341   HGBUR NEGATIVE 05/10/2016 1341   BILIRUBINUR NEGATIVE 05/10/2016 1341   BILIRUBINUR Negative 06/20/2015 1409   KETONESUR NEGATIVE 05/10/2016 1341   PROTEINUR  30 (A) 05/10/2016 1341   NITRITE POSITIVE (A) 05/10/2016 1341   LEUKOCYTESUR LARGE (A) 05/10/2016 1341   LEUKOCYTESUR 1+ (A) 06/20/2015 1409    Radiological Exams on Admission: Ct Chest W Contrast  Result Date: 12/18/2018 CLINICAL DATA:  Low O2 sats. EXAM: CT CHEST WITH CONTRAST TECHNIQUE: Multidetector CT imaging of the chest was performed during intravenous contrast administration. CONTRAST:  67mL OMNIPAQUE IOHEXOL 300 MG/ML  SOLN COMPARISON:  None. FINDINGS: Cardiovascular: Diffuse aortic atherosclerosis and coronary artery disease. Heart is normal size. No evidence of aortic aneurysm. Mediastinum/Nodes: Emphysema. Borderline size mediastinal and left hilar lymph nodes. Left hilar node has a short axis diameter of 8 mm. Subcarinal lymph node has a short axis diameter of 12 mm. AP window lymph node has a short axis diameter of 10 mm. Trachea and esophagus are unremarkable. Numerous nodules throughout the right thyroid lobe, likely multinodular goiter. Lungs/Pleura: Rounded masslike area noted in the left lower lobe measuring 5.8 x 3.7 cm. While this could reflect rounded pneumonia, lung mass/malignancy is a primary concern. No effusions. No confluent opacity on the right. Upper Abdomen: Imaging into the upper abdomen shows  no acute findings. Musculoskeletal: Chest wall soft tissues are unremarkable. No acute bony abnormality. Chronic appearing compression fracture, moderate at T9. IMPRESSION: Emphysema. 5.8 cm rounded mass in the left lower lobe concerning for possible lung cancer. Rounded pneumonia is possible but felt less likely. If a trial of antibiotic therapy is given, this could be followed with repeat CT at that time. Borderline sized left hilar and mediastinal lymph nodes. Coronary artery disease Aortic Atherosclerosis (ICD10-I70.0). Electronically Signed   By: Rolm Baptise M.D.   On: 12/18/2018 19:45   Dg Chest Portable 1 View  Result Date: 12/18/2018 CLINICAL DATA:  Patient presents to the ED from [facility] for oxygen saturation of 85% on room air earlier today. Patient was placed on 6L/[place] by EMS and oxygen saturation increased to 95%. Patient has alzheimer's at baseline and is nonverbal. EXAM: PORTABLE CHEST 1 VIEW COMPARISON:  12/11/2015 FINDINGS: Heart size is normal. There is significant opacity at the LEFT lung base suspicious for mass or consolidation. LEFT pleural effusion. There is dense atherosclerotic calcification of the thoracic aorta. RIGHT lung is clear. IMPRESSION: 1. LEFT lower lobe mass or consolidation. Consider further evaluation is CT of the chest. Intravenous contrast is recommended unless contraindicated. 2. Small LEFT pleural effusion. Electronically Signed   By: Nolon Nations M.D.   On: 12/18/2018 15:50    EKG: Independently reviewed.   Assessment/Plan  Acute hypoxic respiratory failure secondary to questionable lower left pneumonia -Patient requiring up to 2 L via nasal cannula. Wean as tolerated. -CT shows questionable lung mass versus left lower lobe pneumonia -will admit as PUI while COVID test is pending - Received IV vancomycin and cefepime in ED. Will discontinue as PCT is low and CT more concerning for mass. Will need to touch base with family in the morning to discuss  whether to pursue further workup.  -Blood cultures pending  Advanced dementia -Nonverbal at baseline - obtain speech study prior to diet  Hypertension -Continue amlodipine, metoprolol  Protein calorie malnutrition -Consult dietitian  DVT prophylaxis:.Lovenox Code Status:DNR Family Communication: No family at bedside  disposition Plan: Home with at least 2 midnight stays  Consults called:  Admission status: inpatient  Korayma Hagwood T Redell Nazir DO [facility]   If 7PM-7AM, please contact night-coverage [email or web address] Password [email or web address]  12/19/2018, 2:28 AM

## 2018-12-19 NOTE — ED Notes (Signed)
Secretary informed pt needs to go back to [facility] by EMS, pt unable to sign for transport

## 2018-12-19 NOTE — ED Notes (Signed)
ED TO INPATIENT HANDOFF REPORT  ED Nurse Name and Phone #: Daiva Nakayama, [place] Name/Age/Gender Tonya Snyder 85 y.o. female Room/Bed: ED26A/ED26A  Code Status   Code Status: DNR  Home/SNF/Other Skilled nursing facility Pt has dementia/Alzheimer's; pt is confused at baseline Is this baseline? Yes   Triage Complete: Triage complete  Chief Complaint Breathing Difficulty  Triage Note Patient presents to the ED from [facility] for oxygen saturation of 85% on room air earlier today.  Patient was placed on 6L/[place] by EMS and oxygen saturation increased to 95%.  Patient has alzheimer's at baseline and is nonverbal.  Patient has contractures to arms and legs.  Per staff patient's behavior has not changed dramatically.  Patient is in no obvious distress at this time.     Allergies No Known Allergies  Level of Care/Admitting Diagnosis ED Disposition    ED Disposition Condition [facility] Area: [place] [[id number]]  Level of Care: Telemetry [5]  Covid Evaluation: Person Under Investigation (PUI)  Diagnosis: Community acquired bacterial pneumonia [id number]  Admitting Physician: Orene Desanctis [id number]  Attending Physician: Orene Desanctis [id number]  Estimated length of stay: past midnight tomorrow  Certification:: I certify this patient will need inpatient services for at least 2 midnights  PT Class (Do Not Modify): Inpatient [101]  PT Acc Code (Do Not Modify): Private [1]       B Medical/Surgery History Past Medical History:  Diagnosis Date  . Contracture of knee, left   . Contracture of knee, right   . Coronary artery disease   . Dementia ([place])   . Hyperlipidemia   . Hypertension    Past Surgical History:  Procedure Laterality Date  . ABDOMINAL HYSTERECTOMY    . HIP SURGERY       A IV Location/Drains/Wounds Patient Lines/Drains/Airways Status   Active Line/Drains/Airways    Name:   Placement date:   Placement time:   Site:    Days:   Peripheral IV 12/18/18 Right Forearm   12/18/18    1700    Forearm   1          Intake/Output Last 24 hours  Intake/Output Summary (Last 24 hours) at 12/19/2018 0359 Last data filed at 12/18/2018 2049 Gross per 24 hour  Intake 800 ml  Output -  Net 800 ml    Labs/Imaging Results for orders placed or performed during the hospital encounter of 12/18/18 (from the past 48 hour(s))  CBC with Differential     Status: None   Collection Time: 12/18/18  4:56 PM  Result Value Ref Range   WBC 7.7 4.0 - 10.5 K/uL   RBC 4.56 3.87 - 5.11 MIL/uL   Hemoglobin 13.0 12.0 - 15.0 g/dL   HCT 41.1 36.0 - 46.0 %   MCV 90.1 80.0 - 100.0 fL   MCH 28.5 26.0 - 34.0 pg   MCHC 31.6 30.0 - 36.0 g/dL   RDW 14.6 11.5 - 15.5 %   Platelets 234 150 - 400 K/uL   nRBC 0.0 0.0 - 0.2 %   Neutrophils Relative % 77 %   Neutro Abs 5.9 1.7 - 7.7 K/uL   Lymphocytes Relative 16 %   Lymphs Abs 1.2 0.7 - 4.0 K/uL   Monocytes Relative 5 %   Monocytes Absolute 0.4 0.1 - 1.0 K/uL   Eosinophils Relative 1 %   Eosinophils Absolute 0.1 0.0 - 0.5 K/uL   Basophils Relative 0 %   Basophils  Absolute 0.0 0.0 - 0.1 K/uL   Immature Granulocytes 1 %   Abs Immature Granulocytes 0.07 0.00 - 0.07 K/uL    Comment: Performed at [facility], [address] [place], [place] [address]  Comprehensive metabolic panel     Status: Abnormal   Collection Time: 12/18/18  4:56 PM  Result Value Ref Range   Sodium 141 135 - 145 mmol/L   Potassium 4.0 3.5 - 5.1 mmol/L   Chloride 105 98 - 111 mmol/L   CO2 26 22 - 32 mmol/L   Glucose, Bld 92 70 - 99 mg/dL   BUN 17 8 - 23 mg/dL   Creatinine, Ser 0.62 0.44 - 1.00 mg/dL   Calcium 8.5 (L) 8.9 - 10.3 mg/dL   Total Protein 6.7 6.5 - 8.1 g/dL   Albumin 2.8 (L) 3.5 - 5.0 g/dL   AST 21 15 - 41 U/L   ALT 12 0 - 44 U/L   Alkaline Phosphatase 61 38 - 126 U/L   Total Bilirubin 0.3 0.3 - 1.2 mg/dL   GFR calc non Af Amer >60 >60 mL/min   GFR calc Af Amer >60 >60 mL/min   Anion gap  10 5 - 15    Comment: Performed at [facility], [address] [place], [place] [address]  Procalcitonin - Baseline     Status: None   Collection Time: 12/18/18  4:56 PM  Result Value Ref Range   Procalcitonin 0.21 ng/mL    Comment:        Interpretation: PCT (Procalcitonin) <= 0.5 ng/mL: Systemic infection (sepsis) is not likely. Local bacterial infection is possible. (NOTE)       Sepsis PCT Algorithm           Lower Respiratory Tract                                      Infection PCT Algorithm    ----------------------------     ----------------------------         PCT < 0.25 ng/mL                PCT < 0.10 ng/mL         Strongly encourage             Strongly discourage   discontinuation of antibiotics    initiation of antibiotics    ----------------------------     -----------------------------       PCT 0.25 - 0.50 ng/mL            PCT 0.10 - 0.25 ng/mL               OR       >80% decrease in PCT            Discourage initiation of                                            antibiotics      Encourage discontinuation           of antibiotics    ----------------------------     -----------------------------         PCT >= 0.50 ng/mL              PCT 0.26 - 0.50 ng/mL  AND        <80% decrease in PCT             Encourage initiation of                                             antibiotics       Encourage continuation           of antibiotics    ----------------------------     -----------------------------        PCT >= 0.50 ng/mL                  PCT > 0.50 ng/mL               AND         increase in PCT                  Strongly encourage                                      initiation of antibiotics    Strongly encourage escalation           of antibiotics                                     -----------------------------                                           PCT <= 0.25 ng/mL                                                 OR                                         > 80% decrease in PCT                                     Discontinue / Do not initiate                                             antibiotics Performed at [facility], [address] [place], [place] [address]   Lactic acid, plasma     Status: Abnormal   Collection Time: 12/18/18  4:56 PM  Result Value Ref Range   Lactic Acid, Venous 2.1 (HH) 0.5 - 1.9 mmol/L    Comment: CRITICAL RESULT CALLED TO, READ BACK BY AND VERIFIED WITH ANNA HOLT 12/18/18 @ [place] Performed at [facility], [address] [place], [place] [address]   MRSA PCR Screening     Status: None   Collection Time: 12/18/18  4:57 PM  Specimen: Nasopharyngeal  Result Value Ref Range   MRSA by PCR NEGATIVE NEGATIVE    Comment:        The GeneXpert MRSA Assay (FDA approved for NASAL specimens only), is one component of a comprehensive MRSA colonization surveillance program. It is not intended to diagnose MRSA infection nor to guide or monitor treatment for MRSA infections. Performed at [facility], [place], [place] [address]   SARS CORONAVIRUS 2 (TAT 6-24 HRS) Nasopharyngeal Nasopharyngeal Swab     Status: None   Collection Time: 12/18/18  7:42 PM   Specimen: Nasopharyngeal Swab  Result Value Ref Range   SARS Coronavirus 2 NEGATIVE NEGATIVE    Comment: (NOTE) SARS-CoV-2 target nucleic acids are NOT DETECTED. The SARS-CoV-2 RNA is generally detectable in upper and lower respiratory specimens during the acute phase of infection. Negative results do not preclude SARS-CoV-2 infection, do not rule out co-infections with other pathogens, and should not be used as the sole basis for treatment or other patient management decisions. Negative results must be combined with clinical observations, patient history, and epidemiological information. The expected result is Negative. Fact Sheet for  Patients: SugarRoll.be Fact Sheet for Healthcare Providers: https://www.woods-mathews.com/ This test is not yet approved or cleared by the [place] FDA and  has been authorized for detection and/or diagnosis of SARS-CoV-2 by FDA under an Emergency Use Authorization (EUA). This EUA will remain  in effect (meaning this test can be used) for the duration of the COVID-19 declaration under Section 56 4(b)(1) of the Act, 21 U.S.C. section 360bbb-3(b)(1), unless the authorization is terminated or revoked sooner. Performed at [facility], [place] [address] [place], [place] [address]    Ct Chest W Contrast  Result Date: 12/18/2018 CLINICAL DATA:  Low O2 sats. EXAM: CT CHEST WITH CONTRAST TECHNIQUE: Multidetector CT imaging of the chest was performed during intravenous contrast administration. CONTRAST:  6mL OMNIPAQUE IOHEXOL 300 MG/ML  SOLN COMPARISON:  None. FINDINGS: Cardiovascular: Diffuse aortic atherosclerosis and coronary artery disease. Heart is normal size. No evidence of aortic aneurysm. Mediastinum/Nodes: Emphysema. Borderline size mediastinal and left hilar lymph nodes. Left hilar node has a short axis diameter of 8 mm. Subcarinal lymph node has a short axis diameter of 12 mm. AP window lymph node has a short axis diameter of 10 mm. Trachea and esophagus are unremarkable. Numerous nodules throughout the right thyroid lobe, likely multinodular goiter. Lungs/Pleura: Rounded masslike area noted in the left lower lobe measuring 5.8 x 3.7 cm. While this could reflect rounded pneumonia, lung mass/malignancy is a primary concern. No effusions. No confluent opacity on the right. Upper Abdomen: Imaging into the upper abdomen shows no acute findings. Musculoskeletal: Chest wall soft tissues are unremarkable. No acute bony abnormality. Chronic appearing compression fracture, moderate at T9. IMPRESSION: Emphysema. 5.8 cm rounded mass in the left lower lobe  concerning for possible lung cancer. Rounded pneumonia is possible but felt less likely. If a trial of antibiotic therapy is given, this could be followed with repeat CT at that time. Borderline sized left hilar and mediastinal lymph nodes. Coronary artery disease Aortic Atherosclerosis (ICD10-I70.0). Electronically Signed   By: Rolm Baptise M.D.   On: 12/18/2018 19:45   Dg Chest Portable 1 View  Result Date: 12/18/2018 CLINICAL DATA:  Patient presents to the ED from [facility] for oxygen saturation of 85% on room air earlier today. Patient was placed on 6L/[place] by EMS and oxygen saturation increased to 95%. Patient has alzheimer's at baseline  and is nonverbal. EXAM: PORTABLE CHEST 1 VIEW COMPARISON:  12/11/2015 FINDINGS: Heart size is normal. There is significant opacity at the LEFT lung base suspicious for mass or consolidation. LEFT pleural effusion. There is dense atherosclerotic calcification of the thoracic aorta. RIGHT lung is clear. IMPRESSION: 1. LEFT lower lobe mass or consolidation. Consider further evaluation is CT of the chest. Intravenous contrast is recommended unless contraindicated. 2. Small LEFT pleural effusion. Electronically Signed   By: Nolon Nations M.D.   On: 12/18/2018 15:50    Pending Labs Unresulted Labs (From admission, onward)    Start     Ordered   12/19/18 [id number]  Basic metabolic panel  Tomorrow morning,   STAT     12/19/18 0029   12/19/18 0500  CBC  Tomorrow morning,   STAT     12/19/18 0029   12/18/18 1627  Blood culture (routine x 2)  BLOOD CULTURE X 2,   STAT     12/18/18 1627   12/18/18 1529  Lactic acid, plasma  Now then every 2 hours,   STAT     12/18/18 1528          Vitals/Pain Today's Vitals   12/19/18 0015 12/19/18 0104 12/19/18 0241 12/19/18 0342  BP: (!) 153/77 (!) 162/71 (!) 128/98 (!) 147/66  Pulse: 91 94 90 90  Resp: 16 16 17 16   Temp:      TempSrc:      SpO2: 100% 100% 100% 99%  Weight:      Height: 5' 2.99" (1.6 m)        Isolation Precautions No active isolations  Medications Medications  enoxaparin (LOVENOX) injection 30 mg (30 mg Subcutaneous Given 12/19/18 0242)  aspirin EC tablet 81 mg (has no administration in time range)  amLODipine (NORVASC) tablet 5 mg (has no administration in time range)  metoprolol succinate (TOPROL-XL) 24 hr tablet 25 mg (has no administration in time range)  simvastatin (ZOCOR) tablet 20 mg (has no administration in time range)  docusate sodium (COLACE) capsule 100 mg (0 mg Oral Hold 12/19/18 0340)  polyethylene glycol (MIRALAX / GLYCOLAX) packet 17 g (has no administration in time range)  sodium chloride 0.9 % bolus 500 mL (0 mLs Intravenous Stopped 12/18/18 1937)  ceFEPIme (MAXIPIME) 2 g in sodium chloride 0.9 % 100 mL IVPB (0 g Intravenous Stopped 12/18/18 1930)  vancomycin (VANCOCIN) IVPB 1000 mg/200 mL premix (0 mg Intravenous Stopped 12/18/18 2049)  iohexol (OMNIPAQUE) 300 MG/ML solution 75 mL (60 mLs Intravenous Contrast Given 12/18/18 1905)    Mobility non-ambulatory High fall risk   Focused Assessments    R Recommendations: See Admitting Provider Note  Report given to:   Additional Notes: None

## 2018-12-19 NOTE — ED Notes (Signed)
Per Dr. Benny Lennert, pt to be discharged back to facility, AVS printed

## 2018-12-19 NOTE — ED Notes (Signed)
Purple DNR bracelet placed on pt's left wrist at this time.

## 2018-12-19 NOTE — ED Notes (Signed)
Called [facility] to arrange transport to [facility] waiting for [facility] to arrive

## 2018-12-23 LAB — CULTURE, BLOOD (ROUTINE X 2): Culture: NO GROWTH

## 2018-12-25 DIAGNOSIS — J9601 Acute respiratory failure with hypoxia: Secondary | ICD-10-CM | POA: Insufficient documentation

## 2019-05-01 ENCOUNTER — Emergency Department: Payer: Medicare PPO

## 2019-05-01 ENCOUNTER — Encounter: Payer: Self-pay | Admitting: Emergency Medicine

## 2019-05-01 ENCOUNTER — Other Ambulatory Visit: Payer: Self-pay

## 2019-05-01 ENCOUNTER — Observation Stay
Admission: EM | Admit: 2019-05-01 | Discharge: 2019-05-14 | Disposition: E | Payer: Medicare PPO | Attending: Internal Medicine | Admitting: Internal Medicine

## 2019-05-01 DIAGNOSIS — E875 Hyperkalemia: Secondary | ICD-10-CM | POA: Diagnosis not present

## 2019-05-01 DIAGNOSIS — R000 Tachycardia, unspecified: Secondary | ICD-10-CM | POA: Insufficient documentation

## 2019-05-01 DIAGNOSIS — Z79899 Other long term (current) drug therapy: Secondary | ICD-10-CM | POA: Diagnosis not present

## 2019-05-01 DIAGNOSIS — R4189 Other symptoms and signs involving cognitive functions and awareness: Secondary | ICD-10-CM

## 2019-05-01 DIAGNOSIS — Z9981 Dependence on supplemental oxygen: Secondary | ICD-10-CM | POA: Insufficient documentation

## 2019-05-01 DIAGNOSIS — N390 Urinary tract infection, site not specified: Secondary | ICD-10-CM | POA: Diagnosis not present

## 2019-05-01 DIAGNOSIS — Z7982 Long term (current) use of aspirin: Secondary | ICD-10-CM | POA: Insufficient documentation

## 2019-05-01 DIAGNOSIS — R531 Weakness: Secondary | ICD-10-CM

## 2019-05-01 DIAGNOSIS — F03C0 Unspecified dementia, severe, without behavioral disturbance, psychotic disturbance, mood disturbance, and anxiety: Secondary | ICD-10-CM | POA: Diagnosis present

## 2019-05-01 DIAGNOSIS — R748 Abnormal levels of other serum enzymes: Secondary | ICD-10-CM | POA: Diagnosis not present

## 2019-05-01 DIAGNOSIS — J9610 Chronic respiratory failure, unspecified whether with hypoxia or hypercapnia: Secondary | ICD-10-CM | POA: Diagnosis not present

## 2019-05-01 DIAGNOSIS — I10 Essential (primary) hypertension: Secondary | ICD-10-CM | POA: Diagnosis not present

## 2019-05-01 DIAGNOSIS — Z66 Do not resuscitate: Secondary | ICD-10-CM | POA: Insufficient documentation

## 2019-05-01 DIAGNOSIS — Z681 Body mass index (BMI) 19 or less, adult: Secondary | ICD-10-CM | POA: Diagnosis not present

## 2019-05-01 DIAGNOSIS — E872 Acidosis: Secondary | ICD-10-CM | POA: Insufficient documentation

## 2019-05-01 DIAGNOSIS — E785 Hyperlipidemia, unspecified: Secondary | ICD-10-CM | POA: Diagnosis not present

## 2019-05-01 DIAGNOSIS — F17290 Nicotine dependence, other tobacco product, uncomplicated: Secondary | ICD-10-CM | POA: Diagnosis not present

## 2019-05-01 DIAGNOSIS — E43 Unspecified severe protein-calorie malnutrition: Secondary | ICD-10-CM | POA: Diagnosis present

## 2019-05-01 DIAGNOSIS — N179 Acute kidney failure, unspecified: Secondary | ICD-10-CM | POA: Diagnosis present

## 2019-05-01 DIAGNOSIS — F0390 Unspecified dementia without behavioral disturbance: Secondary | ICD-10-CM | POA: Diagnosis not present

## 2019-05-01 DIAGNOSIS — Z20822 Contact with and (suspected) exposure to covid-19: Secondary | ICD-10-CM | POA: Insufficient documentation

## 2019-05-01 DIAGNOSIS — I2510 Atherosclerotic heart disease of native coronary artery without angina pectoris: Secondary | ICD-10-CM | POA: Diagnosis not present

## 2019-05-01 DIAGNOSIS — E860 Dehydration: Principal | ICD-10-CM | POA: Diagnosis present

## 2019-05-01 DIAGNOSIS — I4891 Unspecified atrial fibrillation: Secondary | ICD-10-CM

## 2019-05-01 LAB — COMPREHENSIVE METABOLIC PANEL
ALT: 52 U/L — ABNORMAL HIGH (ref 0–44)
AST: 104 U/L — ABNORMAL HIGH (ref 15–41)
Albumin: 2.9 g/dL — ABNORMAL LOW (ref 3.5–5.0)
Alkaline Phosphatase: 72 U/L (ref 38–126)
Anion gap: 28 — ABNORMAL HIGH (ref 5–15)
BUN: 88 mg/dL — ABNORMAL HIGH (ref 8–23)
CO2: 17 mmol/L — ABNORMAL LOW (ref 22–32)
Calcium: 9.3 mg/dL (ref 8.9–10.3)
Chloride: 106 mmol/L (ref 98–111)
Creatinine, Ser: 3.85 mg/dL — ABNORMAL HIGH (ref 0.44–1.00)
GFR calc Af Amer: 11 mL/min — ABNORMAL LOW (ref >60)
GFR calc non Af Amer: 9 mL/min — ABNORMAL LOW (ref >60)
Glucose, Bld: 37 mg/dL — CL (ref 70–99)
Potassium: 5.7 mmol/L — ABNORMAL HIGH (ref 3.5–5.1)
Sodium: 151 mmol/L — ABNORMAL HIGH (ref 135–145)
Total Bilirubin: 1.3 mg/dL — ABNORMAL HIGH (ref 0.3–1.2)
Total Protein: 7.1 g/dL (ref 6.5–8.1)

## 2019-05-01 LAB — BLOOD GAS, ARTERIAL
Acid-base deficit: 12.4 mmol/L — ABNORMAL HIGH (ref 0.0–2.0)
Bicarbonate: 12.7 mmol/L — ABNORMAL LOW (ref 20.0–28.0)
FIO2: 0.36
O2 Saturation: 99 %
Patient temperature: 37
pCO2 arterial: 27 mmHg — ABNORMAL LOW (ref 32.0–48.0)
pH, Arterial: 7.28 — ABNORMAL LOW (ref 7.350–7.450)
pO2, Arterial: 148 mmHg — ABNORMAL HIGH (ref 83.0–108.0)

## 2019-05-01 LAB — CBC
HCT: 45.1 % (ref 36.0–46.0)
Hemoglobin: 13.9 g/dL (ref 12.0–15.0)
MCH: 27.1 pg (ref 26.0–34.0)
MCHC: 30.8 g/dL (ref 30.0–36.0)
MCV: 87.9 fL (ref 80.0–100.0)
Platelets: 282 10*3/uL (ref 150–400)
RBC: 5.13 MIL/uL — ABNORMAL HIGH (ref 3.87–5.11)
RDW: 16.5 % — ABNORMAL HIGH (ref 11.5–15.5)
WBC: 9 10*3/uL (ref 4.0–10.5)
nRBC: 0.6 % — ABNORMAL HIGH (ref 0.0–0.2)

## 2019-05-01 LAB — GLUCOSE, CAPILLARY
Glucose-Capillary: <10 mg/dL — CL (ref 70–99)
Glucose-Capillary: 132 mg/dL — ABNORMAL HIGH (ref 70–99)

## 2019-05-01 LAB — TROPONIN I (HIGH SENSITIVITY): Troponin I (High Sensitivity): 1062 ng/L (ref <18)

## 2019-05-01 LAB — SARS CORONAVIRUS 2 (TAT 6-24 HRS): SARS Coronavirus 2: NEGATIVE

## 2019-05-01 MED ORDER — LORAZEPAM BOLUS VIA INFUSION: 1.0000 mg | INTRAVENOUS | Status: DC | PRN

## 2019-05-01 MED ORDER — DEXTROSE 10 % IV SOLN: INTRAVENOUS | Status: DC

## 2019-05-01 MED ORDER — DEXTROSE 50 % IV SOLN
INTRAVENOUS | Status: AC
  Administered 2019-05-01: 50 mL via INTRAVENOUS
  Filled 2019-05-01: qty 50

## 2019-05-01 MED ORDER — SODIUM CHLORIDE 0.9 % IV BOLUS
1000.0000 mL | Freq: Once | INTRAVENOUS | Status: AC
  Administered 2019-05-01: 1000 mL via INTRAVENOUS

## 2019-05-01 MED ORDER — DEXTROSE 50 % IV SOLN: 50.0000 mL | Freq: Once | INTRAVENOUS | Status: AC

## 2019-05-01 MED ORDER — MORPHINE SULFATE (PF) 2 MG/ML IV SOLN: 2.0000 mg | INTRAVENOUS | Status: DC | PRN

## 2019-05-01 MED ORDER — LORAZEPAM 2 MG/ML IJ SOLN: 1.0000 mg | INTRAMUSCULAR | Status: DC | PRN

## 2019-05-01 MED ORDER — LACTATED RINGERS IV BOLUS
1000.0000 mL | Freq: Once | INTRAVENOUS | Status: AC
  Administered 2019-05-01: 09:00:00 1000 mL via INTRAVENOUS

## 2019-05-14 NOTE — Progress Notes (Signed)
Palliative Medicine RN Note: Rec'd call from Dr Francine Graven about PMT consult written at 1430; requesting visit for assistance with patient who is actively dying.   Unfortunately, our [facility] providers are in scheduled meetings for the rest of the day and will not be able to see Tonya Snyder. PMT covers [facility] [place] Monday through Friday, so if she remains admitted on Monday 05/04/19, we will evaluate her then. If recommendations are needed in the interim, please call our team line, which is staffed from [place] daily.   Tonya Skiff [name], RN, BSN, [facility] Palliative Medicine Team 05-05-2019 3:31 PM Office [phone]

## 2019-05-14 NOTE — ED Notes (Signed)
Unable to collect any blood work at this time d/t severe dehydration. Will re-attempt after fluids have infused.

## 2019-05-14 NOTE — ED Notes (Signed)
RT notified for ABG

## 2019-05-14 NOTE — ED Notes (Signed)
Poor perfusion SpO2 reading from nare 40%, pulse of 31. ECG monitor showing HR 127. BP 130/50. Pt has pink MOST form indicating DNR/DNI. EDP to call pt's son Gwyndolyn Saxon.

## 2019-05-14 NOTE — ED Notes (Signed)
Notified by lab that green top hemolyzed. Requested phlebotomist stick d/t multiple attempts made by nursing/MD.

## 2019-05-14 NOTE — ED Notes (Signed)
At bedside with EDP to collect green top via US/butterfly. Lab staff at bedside, sample given directly to lab staff [place].

## 2019-05-14 NOTE — ED Provider Notes (Addendum)
[facility] Emergency Department Provider Note  Time seen: 8:45 AM  I have reviewed the triage vital signs and the nursing notes.   HISTORY  Chief Complaint Dehydration   HPI Tonya Snyder is a 86 y.o. female with a past medical history of dementia, hypertension, hyperlipidemia, presents to the emergency department for concerns of dehydration.  According to EMS report patient comes from her nursing facility for dehydration.  EMS found the patient to be quite tachycardic 1 50-160 appears to be in A. fib, patient has dry mucous membranes.  Per report patient has not been eating or drinking very much over the past several days.  Currently not able to answer questions or follow commands unsure of baseline ability.  On 4 L of O2 chronically.  Past Medical History:  Diagnosis Date  . Contracture of knee, left   . Contracture of knee, right   . Coronary artery disease   . Dementia ([place])   . Hyperlipidemia   . Hypertension     Patient Active Problem List   Diagnosis Date Noted  . Acute respiratory failure with hypoxemia ([place])   . Community acquired bacterial pneumonia 12/19/2018  . Essential hypertension 12/19/2018  . Advanced dementia ([place]) 12/19/2018  . Hypoxia 12/19/2018  . Acute encephalopathy 05/10/2016  . Protein-calorie malnutrition, severe ([place]) 07/09/2014  . Pneumonia 07/07/2014  . Fall 07/07/2014  . Hypokalemia 07/07/2014  . Lipoma of arm 10/14/2013    Past Surgical History:  Procedure Laterality Date  . ABDOMINAL HYSTERECTOMY    . HIP SURGERY      Prior to Admission medications   Medication Sig Start Date End Date Taking? Authorizing Provider  acetaminophen (TYLENOL) 325 MG tablet Take 325 mg by mouth every 4 (four) hours as needed for mild pain.     [provider]  amLODipine (NORVASC) 5 MG tablet Take 5 mg by mouth daily.     [provider]  ammonium lactate (LAC-HYDRIN) 12 % lotion Apply 1 application topically 2 (two)  times daily. (apply to feet)    [provider]  aspirin EC 81 MG tablet Take 81 mg by mouth daily.    [provider]  ciclopirox (LOPROX) 0.77 % SUSP Apply 1 application topically daily. (apply to toenails)    [provider]  docusate sodium (COLACE) 100 MG capsule Take 100 mg by mouth 2 (two) times daily.     [provider]  metoprolol succinate (TOPROL-XL) 25 MG 24 hr tablet Take 25 mg by mouth daily.    [provider]  Multiple Vitamins-Minerals (MULTIVITAMIN PO) Take 1 tablet by mouth daily. 07/27/13   [provider]  polyethylene glycol (MIRALAX / GLYCOLAX) 17 g packet Take 17 g by mouth daily. (hold for more than 1 BM daily)    [provider]  simvastatin (ZOCOR) 20 MG tablet Take 20 mg by mouth at bedtime.     [provider]    No Known Allergies  Family History  Problem Relation Age of Onset  . Bladder Cancer Neg Hx   . Kidney cancer Neg Hx     Social History Social History   Tobacco Use  . Smoking status: Former Research scientist (life sciences)  . Smokeless tobacco: Current User    Types: Snuff  Substance Use Topics  . Alcohol use: No    Alcohol/week: 0.0 standard drinks  . Drug use: No    Review of Systems Unable to obtain adequate/accurate review of systems secondary to baseline dementia  and possible acute altered mental status. ____________________________________________   PHYSICAL EXAM:  VITAL SIGNS: ED Triage Vitals [04-May-2019 0821]  Enc Vitals Group     BP 122/87     Pulse Rate 96     Resp 20     Temp      Temp src      SpO2 100 %     Weight      Height      Head Circumference      [place]      Pain Score      Pain Loc      Pain Edu?      Excl. in [place]?    Constitutional: Patient is awake, unable to answer questions or follow commands.  Patient lying on her side with somewhat contracted upper extremities. Eyes: Normal exam ENT      Head: Normocephalic and atraumatic.      Mouth/Throat: Dry  appearing mucous membranes Cardiovascular: Appears to have a regular rhythm with a rate around 150 bpm.  No obvious murmur. Respiratory: Normal respiratory effort without tachypnea nor retractions. Breath sounds are clear Gastrointestinal: Soft and nontender. No distention. Musculoskeletal: Patient is very thin extremities Neurologic:  Normal speech and language. No gross focal neurologic deficits are appreciated. Skin:  Skin is warm, dry.  Skin tenting present Psychiatric: Seems confused, unclear of baseline  ____________________________________________    EKG  EKG viewed and interpreted by myself appears show tachycardia possibly SVT versus A. fib at 151 bpm with a narrow QRS, normal axis, largely normal intervals nonspecific ST changes  ____________________________________________    RADIOLOGY  Chest x-ray is negative  ____________________________________________   INITIAL IMPRESSION / ASSESSMENT AND PLAN / ED COURSE  Pertinent labs & imaging results that were available during my care of the patient were reviewed by me and considered in my medical decision making (see chart for details).   Patient presents emergency department with concerns of dehydration found to be quite tachycardic around 150 bpm.  EKG appears to show A. fib/flutter versus SVT.  We will dose IV fluids and continue to closely monitor.  We will check labs, chest x-ray and urinalysis.  We continue to have significant difficulty getting lab work.  CBC is resulted largely nonrevealing.  Blood gas shows slight acidosis, normal PaO2, having difficulty getting a pulse ox to register.  Patient's chemistry has hemolyzed twice now.  I have placed an ultrasound-guided IV that has since dislodged.  Patient has been stuck numerous times.  Patient's tachycardia appears to be responding somewhat to fluids currently around 125 to 130 bpm down from 150.  Has a working IV receiving IV fluids but continued to have difficulty  getting lab work, IV does not draw back.  Patient has become less responsive at this point.  With slowed respiratory rate and agonal type respirations at times.  I spoke to the patient's son regarding her grim prognosis, and offered to have any family member who wishes to see her come to the emergency department to see her or be with her during this time as it very likely could be an end-of-life event.  Patient has a MOST form that indicates DO NOT RESUSCITATE.  Son confirm this.  He states his big concern is just making sure she is calm and comfortable.  At this time there is no sign of distress whatsoever she is sleeping comfortably.  Patient's lab is hemolyzed once again.  We are now sending her fourth sample trying to get  a chemistry to result (nurse x1, lab x1 and myself via [place] x 2).  I have once again ultrasound-guided this time a butterfly needle for chemistry tube.    I have placed a palliative care consult as well for the patient as she continues to have agonal type respirations but in no apparent discomfort, with minimal to no response to painful stimuli (i.e. needle stick).  Patient accepted to the hospitalist service for further treatment. "Low" blood glucose, dosing D50.  Chemistry pending. Troponin elevated.  Laural Benes was evaluated in Emergency Department on 05/23/19 for the symptoms described in the history of present illness. She was evaluated in the context of the global COVID-19 pandemic, which necessitated consideration that the patient might be at risk for infection with the SARS-CoV-2 virus that causes COVID-19. Institutional protocols and algorithms that pertain to the evaluation of patients at risk for COVID-19 are in a state of rapid change based on information released by regulatory bodies including the CDC and federal and state organizations. These policies and algorithms were followed during the patient's care in the ED.  Peripheral IV insertion by physician Indication:  Multiple failed attempts by nursing staff, need for IV access and/or blood samples for workup Performed under continuous real-time ultrasound visualization Area cleaned with chlorhexidine. 20-gauge IV successfully placed in the Right antecubital fossa. 1 attempt, no complications, EBL 0.    ____________________________________________   FINAL CLINICAL IMPRESSION(S) / ED DIAGNOSES  Dehydration Tachycardia    Harvest Dark, MD 05/23/19 1512    Harvest Dark, MD 05/02/19 1529    Harvest Dark, MD 05/24/19 [phone]

## 2019-05-14 NOTE — ED Notes (Signed)
Noted RFA IV infiltrated, swelling noted to forearm. New PIV placed, small amount of blood obtained with new stick, sent to lab in green top tube.

## 2019-05-14 NOTE — ED Notes (Addendum)
Pt ultrasounded by EDP Paduchowski, able to collect blood for lab work but unable to keep IV. Rainbow and gray top on ice sent to lab.   EDP also notified now unable to obtain accurate O2 sat d/t poor perfusion. Have tried fingertips, toes, and ear. Pt is tachypneic at 40-50 RR on chronic 4lpm, fingertips appear dusky, slow cap refill. Order received for ABG. Additional warm blankets applied.

## 2019-05-14 NOTE — H&P (Addendum)
History and Physical    Tonya Snyder [id number] DOB: 11/27/1920 DOA: 05-16-19  PCP: Lavera Guise, MD   Patient coming from: SNF  I have personally briefly reviewed patient's old medical records in [place]  Chief Complaint: Patient was sent to the emergency room for possible dehydration Unable to obtain any history from patient to her history of dementia  HPI: Tonya Snyder is a 86 y.o. female with medical history significant for dementia, chronic respiratory failure on 4 L of oxygen and hypertension who was sent to the emergency room from the skilled nursing facility for concerns of dehydration.  EMS notes patient was tachycardic with heart rate in the 160's and had very dry mucous membranes.  Per nursing home report, patient had not been eating or drinking very much over the past several days. Unable to do review of systems due to patient's mental status changes  ED Course: Patient presents to the emergency department with concerns of dehydration and was found to be quite tachycardic around 150 bpm.  EKG appears to show A. fib/flutter versus SVT. We continue to have significant difficulty getting lab work.  CBC is resulted largely nonrevealing.  Blood gas shows slight acidosis, normal PaO2, having difficulty getting a pulse ox to register.  Patient's chemistry has hemolyzed twice now.  I have placed an ultrasound-guided IV that has since dislodged.  Patient has been stuck numerous times. Patient's tachycardia appears to be responding somewhat to fluids currently around 125 to 130 bpm down from 150.  Patient has become less responsive at this point.  With slowed respiratory rate and agonal type respirations at times.  Dr Kerman Passey spoke to the patient's son regarding her grim prognosis, and offered to have any family member who wishes to see her come to the emergency department to see her or be with her during this time as it very likely could be an end-of-life event.  Patient  has a MOST form that indicates DO NOT RESUSCITATE.  Son confirm this.  He states his big concern is just making sure she is calm and comfortable.  At this time there is no sign of distress whatsoever she is sleeping comfortably.  Review of Systems: As per HPI otherwise 10 point review of systems negative.    Past Medical History:  Diagnosis Date  . Contracture of knee, left   . Contracture of knee, right   . Coronary artery disease   . Dementia ([place])   . Hyperlipidemia   . Hypertension     Past Surgical History:  Procedure Laterality Date  . ABDOMINAL HYSTERECTOMY    . HIP SURGERY       reports that she has quit smoking. Her smokeless tobacco use includes snuff. She reports that she does not drink alcohol or use drugs.  No Known Allergies  Family History  Problem Relation Age of Onset  . Bladder Cancer Neg Hx   . Kidney cancer Neg Hx      Prior to Admission medications   Medication Sig Start Date End Date Taking? Authorizing Provider  acetaminophen (TYLENOL) 325 MG tablet Take 325 mg by mouth every 4 (four) hours as needed for mild pain.    Yes [provider]  amLODipine (NORVASC) 5 MG tablet Take 5 mg by mouth daily.    Yes [provider]  aspirin 81 MG chewable tablet Chew 81 mg by mouth daily.   Yes [provider]  docusate sodium (COLACE) 100 MG capsule Take 100  mg by mouth 2 (two) times daily.    Yes [provider]  metoprolol succinate (TOPROL-XL) 25 MG 24 hr tablet Take 25 mg by mouth daily.   Yes [provider]  mirtazapine (REMERON) 7.5 MG tablet Take 7.5 mg by mouth at bedtime.   Yes [provider]  Multiple Vitamins-Minerals (MULTIVITAMIN PO) Take 1 tablet by mouth daily. 07/27/13  Yes [provider]  polyethylene glycol (MIRALAX / GLYCOLAX) 17 g packet Take 17 g by mouth daily. (hold for more than 1 BM daily)   Yes [provider]  simvastatin (ZOCOR) 20 MG tablet Take 20 mg by mouth at  bedtime.    Yes [provider]    Physical Exam: Vitals:   2019-05-06 1330 May 06, 2019 1400 May 06, 2019 1453 05-06-2019 1500  BP: (!) 163/37 (!) 137/53  (!) 122/98  Pulse: (!) 33 (!) 30 (!) 36 (!) 39  Resp: (!) 34 (!) 30 (!) 35 (!) 36  Temp:      TempSrc:      SpO2:   (!) 71% (!) 74%  Weight:      Height:         Vitals:   2019-05-06 1330 2019/05/06 1400 06-May-2019 1453 2019/05/06 1500  BP: (!) 163/37 (!) 137/53  (!) 122/98  Pulse: (!) 33 (!) 30 (!) 36 (!) 39  Resp: (!) 34 (!) 30 (!) 35 (!) 36  Temp:      TempSrc:      SpO2:   (!) 71% (!) 74%  Weight:      Height:        Constitutional: Lethargic Eyes: PERRL, lids and conjunctivae normal ENMT: Mucous membranes are dry Neck: normal, supple, no masses, no thyromegaly Respiratory: clear to auscultation bilaterally, no wheezing, no crackles. Normal respiratory effort. No accessory muscle use.  Cardiovascular: Tachycardia, no murmurs / rubs / gallops. No extremity edema. 2+ pedal pulses. No carotid bruits.  Abdomen: no tenderness, no masses palpated. No hepatosplenomegaly. Bowel sounds positive.  Musculoskeletal: no clubbing / cyanosis. Contracted lower extremities Skin: no rashes, lesions, poor skin tugor Neurologic: No gross focal neurologic deficit. Psychiatric: Normal mood and affect.   Labs on Admission: I have personally reviewed following labs and imaging studies  CBC: Recent Labs  Lab 2019-05-06 1205  WBC 9.0  HGB 13.9  HCT 45.1  MCV 87.9  PLT [id number]   Basic Metabolic Panel: No results for input(s): NA, K, CL, CO2, GLUCOSE, BUN, CREATININE, CALCIUM, MG, PHOS in the last 168 hours. GFR: CrCl cannot be calculated (Patient's most recent lab result is older than the maximum 21 days allowed.). Liver Function Tests: No results for input(s): AST, ALT, ALKPHOS, BILITOT, PROT, ALBUMIN in the last 168 hours. No results for input(s): LIPASE, AMYLASE in the last 168 hours. No results for input(s): AMMONIA in the last 168  hours. Coagulation Profile: No results for input(s): INR, PROTIME in the last 168 hours. Cardiac Enzymes: No results for input(s): CKTOTAL, CKMB, CKMBINDEX, TROPONINI in the last 168 hours. BNP (last 3 results) No results for input(s): PROBNP in the last 8760 hours. HbA1C: No results for input(s): HGBA1C in the last 72 hours. CBG: No results for input(s): GLUCAP in the last 168 hours. Lipid Profile: No results for input(s): CHOL, HDL, LDLCALC, TRIG, CHOLHDL, LDLDIRECT in the last 72 hours. Thyroid Function Tests: No results for input(s): TSH, T4TOTAL, FREET4, T3FREE, THYROIDAB in the last 72 hours. Anemia Panel: No results for input(s): VITAMINB12, FOLATE, FERRITIN, TIBC, IRON, RETICCTPCT in the  last 72 hours. Urine analysis:    Component Value Date/Time   COLORURINE AMBER (A) 05/10/2016 1341   APPEARANCEUR HAZY (A) 05/10/2016 1341   APPEARANCEUR Clear 06/20/2015 1409   LABSPEC 1.028 05/10/2016 1341   PHURINE 7.0 05/10/2016 1341   GLUCOSEU NEGATIVE 05/10/2016 1341   HGBUR NEGATIVE 05/10/2016 1341   BILIRUBINUR NEGATIVE 05/10/2016 1341   BILIRUBINUR Negative 06/20/2015 1409   KETONESUR NEGATIVE 05/10/2016 1341   PROTEINUR 30 (A) 05/10/2016 1341   NITRITE POSITIVE (A) 05/10/2016 1341   LEUKOCYTESUR LARGE (A) 05/10/2016 1341   LEUKOCYTESUR 1+ (A) 06/20/2015 1409    Radiological Exams on Admission: DG Chest Portable 1 View  Result Date: 05-26-19 CLINICAL DATA:  Shortness of breath EXAM: PORTABLE CHEST 1 VIEW COMPARISON:  Chest CT 12/18/2018 FINDINGS: Chronic elevation of the left diaphragm obscuring the left base. Normal heart size and mediastinal contours for technique. IMPRESSION: No acute finding.  Chronic elevation of the left diaphragm. Electronically Signed   By: Monte Fantasia M.D.   On: 26-May-2019 09:20    EKG: Independently reviewed.  Sinus Tachycardic  Assessment/Plan Principal Problem:   Dehydration Active Problems:   Protein-calorie malnutrition, severe  (HCC)   Advanced dementia ([place])   Acute lower UTI    AKI Secondary to poor oral intake from advanced dementia At baseline patient has a serum creatinine of 0.5 and on admission it was 3.8. She has very dry mucous membranes and poor skin tugor Patient received IVF in the ER. She is currently poorly responsive and has guppy breathing Patient's overall prognosis is very poor and family requests comfort measures Will request palliative care consult   Advanced Dementia Patient is non verbal and contracted Overall prognosis is poor Will place on comfort measures   UTI Patient with significant pyuria Prior urine culture yielded Proteus Mirabilis Will not treat since patient will be placed on comfort measures   Hyperkalemia Secondary to AKI   Elevated troponin Most likely secondary to narrow complex tachycardia which patient had on admission No further work up needed since patient is on comfort measures  DVT prophylaxis: None Code Status: DNR Family Communication: Discussed plan of care with patient's son over the phone Wyonia Hough, all questions and concerns have been addressed. Patient is a DNR and he wishes for her to be kept comfortable Disposition Plan: Back to SNF Consults called: Palliative care    Jahad Old MD [facility]     05/26/19, 3:14 PM

## 2019-05-14 NOTE — ED Notes (Signed)
Pt in bed at this time, patient is positioned on left side with legs brought up and arms at head. Pt is showing signs of slowed respirations. PT is currently on NRB for comfort. Pt has spO2 sensor on nose without accurate readings. Pt provided warm blanket and sheets were repositioned.

## 2019-05-14 NOTE — ED Notes (Signed)
Time of Death called at 2005 by Dr. Quentin Cornwall.

## 2019-05-14 NOTE — ED Notes (Signed)
ABG results reported to MD.  

## 2019-05-14 NOTE — ED Provider Notes (Signed)
Notified by nurses that patient was unresponsive with nonperfusing rhythm on prematurity.  Pupils nonreactive.  Patient apneic.  Pulseless and no heart sounds.  Patient pronounced dead at 8:05 PM.   Merlyn Lot, MD 05/22/2019 2010

## 2019-05-14 NOTE — ED Notes (Signed)
Date and time results received: April 28, [id number]   Test: glucose, troponin I Critical Value: 37, 1062 respectively  Name of Provider Notified: Dr. Kerman Passey  Orders Received? Or Actions Taken?: Orders Received - See Orders for details

## 2019-05-14 NOTE — ED Notes (Signed)
Received call from lab stating recollect needed again on green top d/t short sample. Explained that phlebotomy had collected the most recent sample. Lab states they will send again.

## 2019-05-14 NOTE — ED Notes (Signed)
Pt with mental status change, not responding to sternal rub or nail bed pressure. Appears to have shallow, labored breathing with accessory muscle use. RR29. Reported to EDP Paduchowski and Agricultural consultant.

## 2019-05-14 NOTE — ED Triage Notes (Addendum)
Pt presents to ED via [facility] from [facility] c/o possible dehydration per EMS. EMS report P150-160, afib, BP 90s/50s palpated, skin dry and tenting. Mucous membranes dry. SNF report decreased PO intake. Pt does not answer questions, dementia at baseline. On 4L n/c chronically. Has MOST form.

## 2019-05-14 DEATH — deceased
